# Patient Record
Sex: Male | Born: 1965
Health system: Southern US, Community
[De-identification: ages and names within clinical notes are randomized; demographics above are authoritative.]

## PROBLEM LIST (undated history)

## (undated) DIAGNOSIS — Z8619 Personal history of other infectious and parasitic diseases: Secondary | ICD-10-CM

## (undated) DIAGNOSIS — Z9884 Bariatric surgery status: Secondary | ICD-10-CM

## (undated) DIAGNOSIS — K760 Fatty (change of) liver, not elsewhere classified: Secondary | ICD-10-CM

## (undated) DIAGNOSIS — F419 Anxiety disorder, unspecified: Secondary | ICD-10-CM

## (undated) DIAGNOSIS — R0600 Dyspnea, unspecified: Secondary | ICD-10-CM

## (undated) DIAGNOSIS — D649 Anemia, unspecified: Secondary | ICD-10-CM

## (undated) DIAGNOSIS — M109 Gout, unspecified: Secondary | ICD-10-CM

## (undated) HISTORY — DX: Personal history of other infectious and parasitic diseases: Z86.19

## (undated) HISTORY — PX: INGUINAL HERNIA REPAIR: SUR1180

## (undated) HISTORY — DX: Anxiety disorder, unspecified: F41.9

## (undated) HISTORY — DX: Bariatric surgery status: Z98.84

## (undated) HISTORY — PX: VENTRAL HERNIA REPAIR: SHX424

## (undated) HISTORY — PX: JOINT REPLACEMENT: SHX530

---

## 1998-01-09 DIAGNOSIS — Z9884 Bariatric surgery status: Secondary | ICD-10-CM

## 1998-01-09 HISTORY — DX: Bariatric surgery status: Z98.84

## 1998-01-09 HISTORY — PX: ROUX-EN-Y PROCEDURE: SUR1287

## 2003-01-10 DIAGNOSIS — T8859XA Other complications of anesthesia, initial encounter: Secondary | ICD-10-CM

## 2003-01-10 HISTORY — DX: Other complications of anesthesia, initial encounter: T88.59XA

## 2004-12-30 ENCOUNTER — Encounter: Admission: RE | Admit: 2004-12-30 | Discharge: 2004-12-30 | Payer: Self-pay | Admitting: Surgery

## 2006-05-16 ENCOUNTER — Encounter: Admission: RE | Admit: 2006-05-16 | Discharge: 2006-05-16 | Payer: Self-pay | Admitting: Surgery

## 2006-07-30 ENCOUNTER — Ambulatory Visit (HOSPITAL_COMMUNITY): Admission: RE | Admit: 2006-07-30 | Discharge: 2006-07-30 | Payer: Self-pay | Admitting: Surgery

## 2007-11-11 ENCOUNTER — Ambulatory Visit (HOSPITAL_COMMUNITY): Admission: RE | Admit: 2007-11-11 | Discharge: 2007-11-12 | Payer: Self-pay | Admitting: Surgery

## 2008-03-26 IMAGING — RF DG UGI W/ KUB
15 series · 15 of 15 positions shown · non-contrast
Comparison: none

CLINICAL DATA: Early satiety.  Evaluate pouch after gastric bypass surgery previously.
 KUB, UPPER GI:
 KUB:  Preliminary film of the abdomen shows a nonspecific bowel gas pattern.  Surgical sutures are noted within the medial left upper quadrant.  
 Upper GI:  A single contrast upper GI was performed.  The swallowing mechanism appears normal.  No hiatal hernia is seen. The gastric pouch fills and there is no delay in passage of barium into the small bowel in this patient who has a small gastric pouch and gastrojejunostomy.  No ulceration is seen.  There is moderate gastroesophageal reflux noted.  The proximal small bowel that is visualized appears normal.

[Series 1: run · 1 of 1 slices shown (1 of 14)]
[im 1/1]
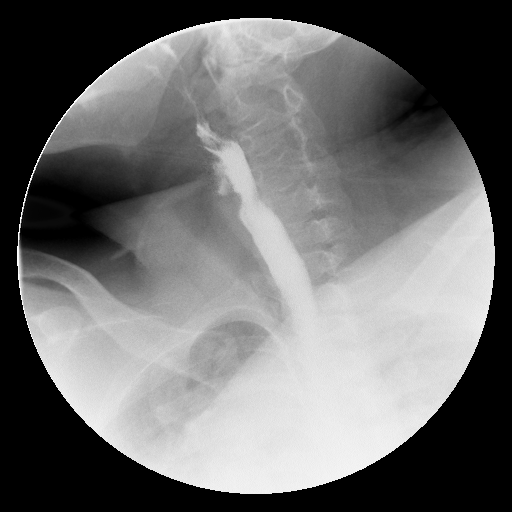

[Series 2: run · 1 of 1 slices shown (2 of 14)]
[im 1/1]
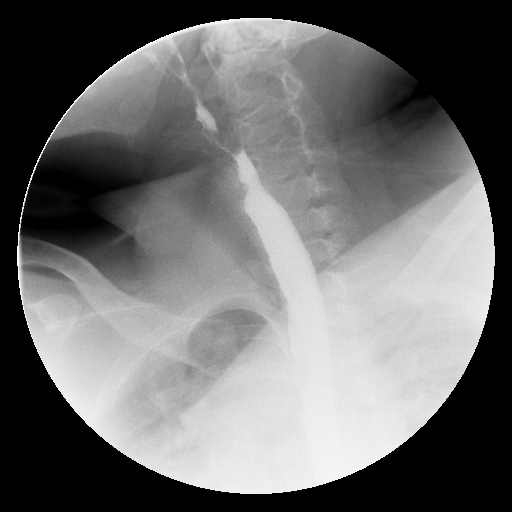

[Series 3: run · 1 of 1 slices shown (3 of 14)]
[im 1/1]
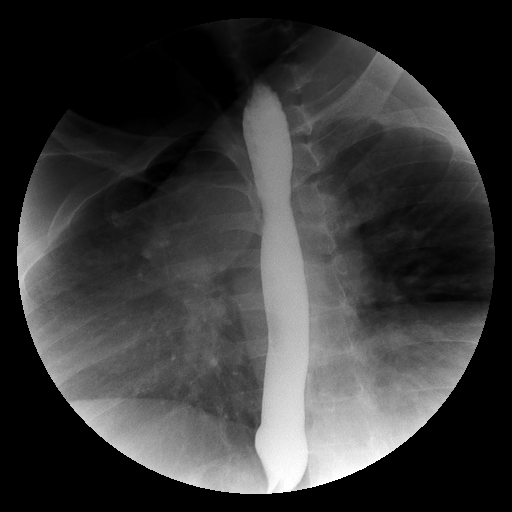

[Series 4: run · 1 of 1 slices shown (4 of 14)]
[im 1/1]
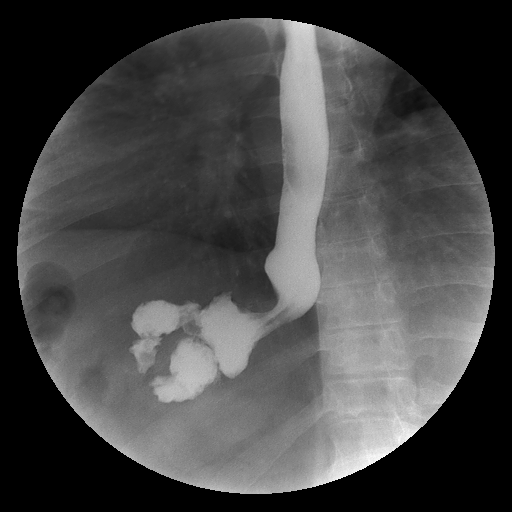

[Series 5: run · 1 of 1 slices shown (5 of 14)]
[im 1/1]
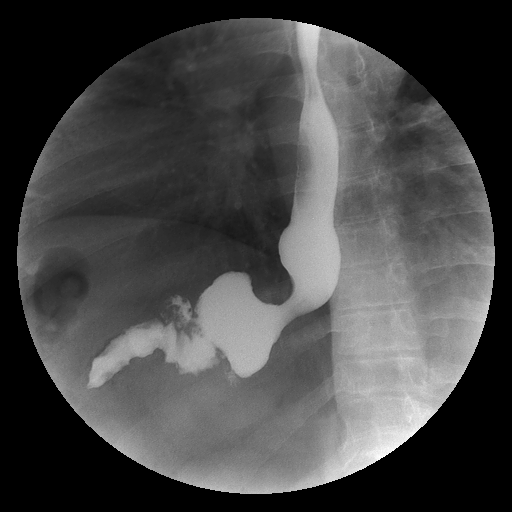

[Series 6: run · 1 of 1 slices shown (6 of 14)]
[im 1/1]
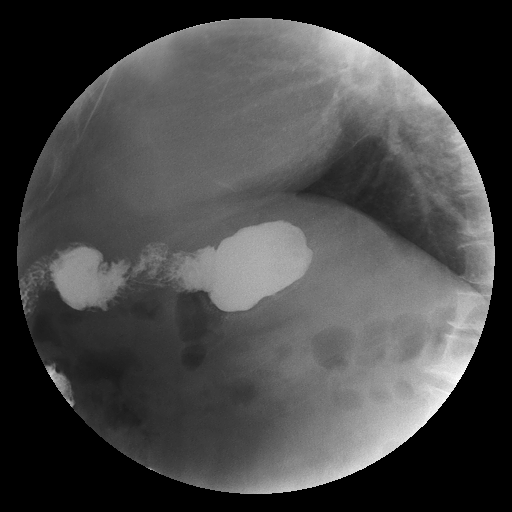

[Series 7: run · 1 of 1 slices shown (7 of 14)]
[im 1/1]
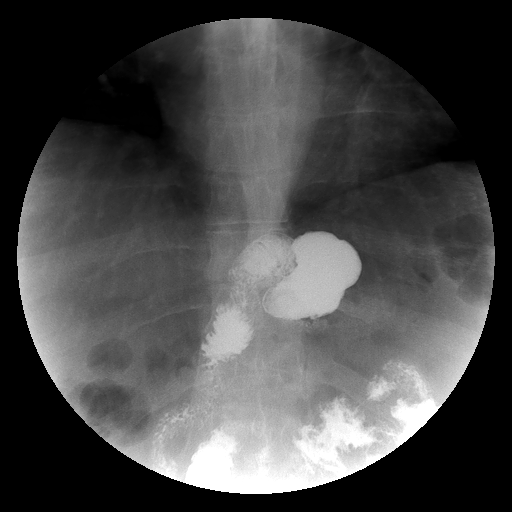

[Series 8: run · 1 of 1 slices shown (8 of 14)]
[im 1/1]
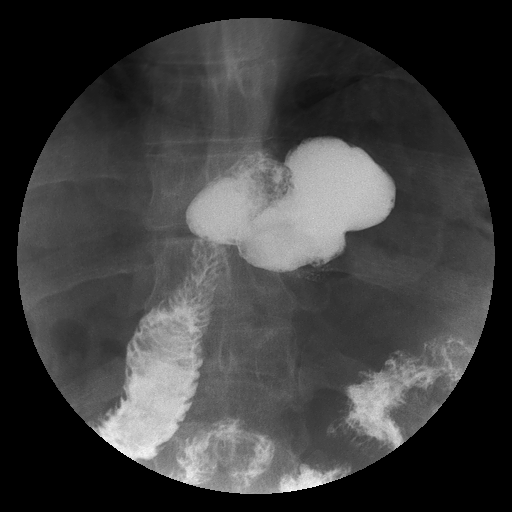

[Series 9: run · 1 of 1 slices shown (9 of 14)]
[im 1/1]
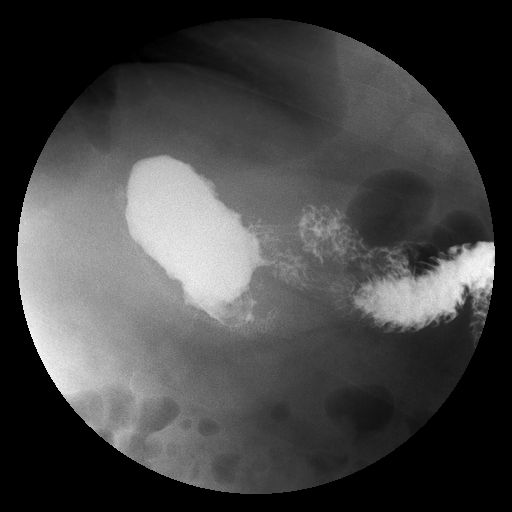

[Series 10: run · 1 of 1 slices shown (10 of 14)]
[im 1/1]
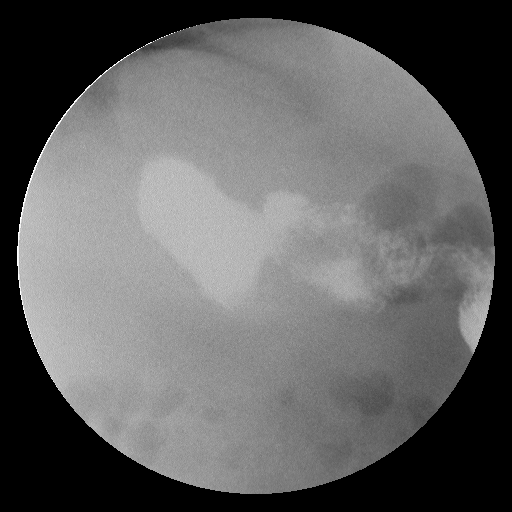

[Series 11: run · 1 of 1 slices shown (11 of 14)]
[im 1/1]
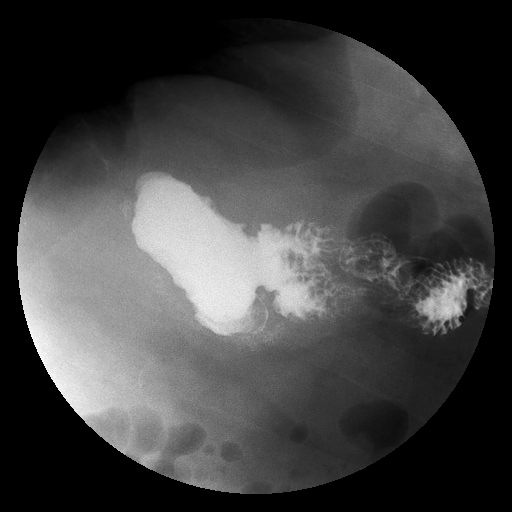

[Series 12: run · 1 of 1 slices shown (12 of 14)]
[im 1/1]
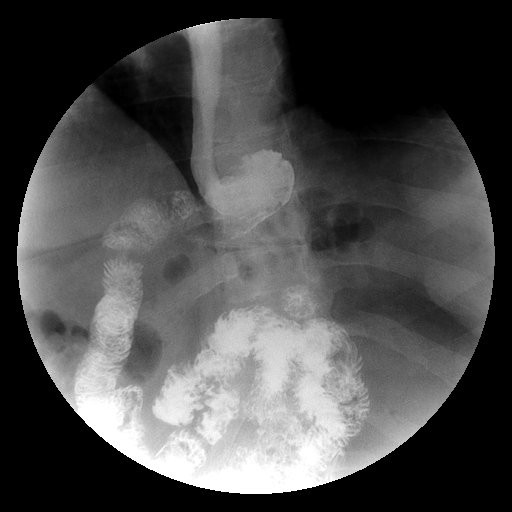

[Series 13: run · 1 of 1 slices shown (13 of 14)]
[im 1/1]
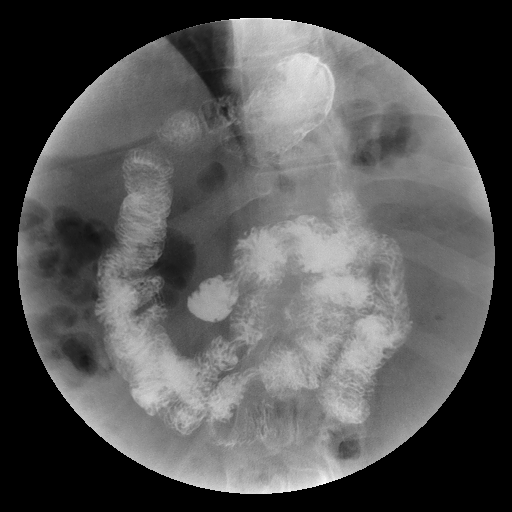

[Series 14: run · 1 of 1 slices shown (14 of 14)]
[im 1/1]
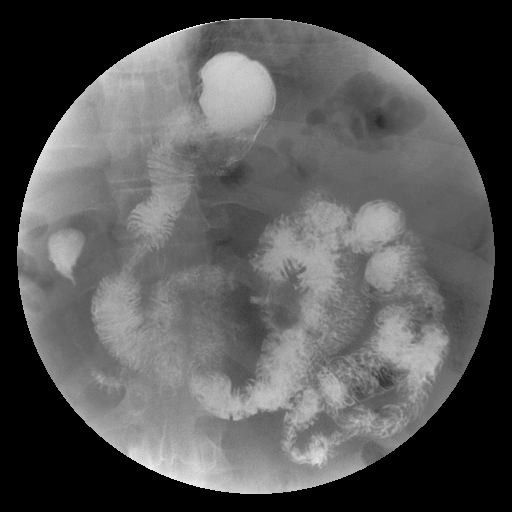

[Series 1001: view not recorded · 0.20mm/px · 1 of 1 slices shown]
[im 1/1]
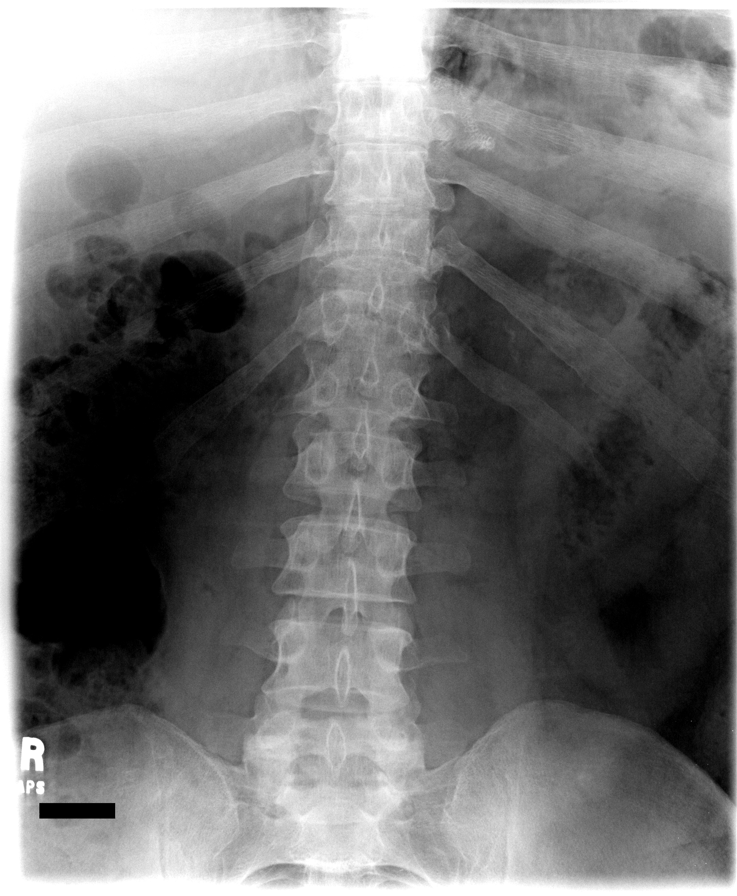

[15 of 15 positions shown; findings below may reference images not displayed]

IMPRESSION: Small gastric pouch with no delay in passage of barium into the small bowel.  Moderate reflux.

## 2010-05-24 NOTE — Op Note (Signed)
[name], [name]                  ACCOUNT NO.:  [id number]   MEDICAL RECORD NO.:  [id number]          PATIENT TYPE:  AMB   LOCATION:  ENDO                         FACILITY:  [facility]   PHYSICIAN:  Sandria Bales. Ezzard Standing, M.D.  DATE OF BIRTH:  10-08-1965   DATE OF PROCEDURE:  DATE OF DISCHARGE:                               OPERATIVE REPORT   PREOPERATIVE DIAGNOSES:  Weight gain post gastric bypass.   POSTOPERATIVE DIAGNOSES:  Slightly enlarged pouch with normal  anastomosis, status post gastrojejunostomy.   PROCEDURE:  Esophagogastrojejunoscopy.   SURGEON:  Sandria Bales. Ezzard Standing, M.D.   FIRST ASSISTANT:  None.   ANESTHESIA:  50 mcg of fentanyl, 5 mg of Versed.   COMPLICATIONS:  None.   INDICATIONS FOR PROCEDURE:  Mr. Bruce Wang is a 45 year old, white male who  had a gastric bypass in [facility] in May of 2000. He initial weight  was approximately 397, he says his bottom weight was 240 pounds, he is  now back up to about 295 pounds and feels like he has no restriction  from his bypass.   He now comes for endoscopy to better define his anatomy. He had an upper  GI which actually showed a small pouch with a prompt emptying of his  gastric contents into his jejunum.   DESCRIPTION OF PROCEDURE:  The patient was placed in the left lateral  decubitus position. He was initially anesthetized the back of his throat  with Cetacaine. He had an IV in place, he had an EKG, pulse oximetry,  nasal O2 and EKG to monitor during his sedation   I gave him 50 mcg of fentanyl, 5 mg of Versed, passed the Pentax upper  endoscope without difficulty into his gastric pouch. I visualized his EG  junction at 40 cm, I visualized his gastrojejunal anastomosis at 45 cm.  He had a healthy looking anastomosis with no evidence of ulcer though he  did have what appeared to be a silk suture immediately beside the  anastomosis. His stomach pouch is mildly enlarged and this was enough  that when I retroflexed the scope I could  retroflex it within the  stomach pouch. I saw no ulcers, no erosions, no evidence of fistula.   IMPRESSION:  He has a mildly enlarged stomach. I wonder whether it is  worth surgically to try to revise the stomach as in paring its size  down. I think no matter what happens Mr. Cozort will have to control his  eating from a diet standpoint and increase exercise. I gave him copies  of the pictures and discussed this with him and his wife.      Sandria Bales. Ezzard Standing, M.D.  Electronically Signed     DHN/MEDQ  D:  07/30/2006  T:  07/30/2006  Job:  [id number]   cc:   [place] Daphine Deutscher, MD  [address], [address]  [place]  [place] [address]

## 2010-05-24 NOTE — Op Note (Signed)
Wang, Bruce                  ACCOUNT NO.:  [id number]   MEDICAL RECORD NO.:  [id number]          PATIENT TYPE:  OIB   LOCATION:  0098                         FACILITY:  [facility]   PHYSICIAN:  Ardeth Sportsman, MD     DATE OF BIRTH:  June 30, 1965   DATE OF PROCEDURE:  DATE OF DISCHARGE:                               OPERATIVE REPORT   PRIMARY CARE PHYSICIAN:  [facility] [place], [place].   SURGEON:  Ardeth Sportsman, M.D.   ASSISTANT:  None.   PREOPERATIVE DIAGNOSIS:  Ventral hernia, status post open gastric  bypass.   POSTOPERATIVE DIAGNOSIS:  Incarcerated open ventral hernia, status post  open gastric bypass graft.   PROCEDURE PERFORMED:  1. Laparoscopic lysis of adhesions x60 minutes (equals half the case).  2. Laparoscopic ventral hernia repair with 20 x 30 cm dual-sided mesh      (Parietex/Seprafilm).   ANESTHESIA:  1. General anesthesia.  2. Local anesthetic in a field block around all port sites.   SPECIMENS:  None.   DRAINS:  None.   ESTIMATED BLOOD LOSS:  15 ml.   COMPLICATIONS:  None apparent.   INDICATIONS:  Bruce Wang is a pleasant 45 year old male who had an open  gastric bypass performed back in March, 2000 through an open incision in  [place], [place].  He has struggled with pain at his incision for some  time now.  It is a little bit of an inability to work in some ways.  He  has been working to try and lose weight after his gastric bypass graft.  He has been evaluated by our bariatric team.   The anatomy and physiology of hernia was explained.  The pathophysiology  of incarceration and possible strangulation and debilitating pain was  discussed.  Options discussed.  Recommendations made for diagnostic  laparoscopy with lysis of adhesions and underlay repair with mesh.  Risks, benefits and alternatives were discussed.  Questions answered.  He agreed to proceed.   OPERATIVE FINDINGS:  He had a large Swiss cheese effect of most of his  supraumbilical incision, going about 7 x 14 cm in size with 4 moderate-  sized defects over this region.  It was incarcerated with omentum and  transverse colon.   DESCRIPTION OF PROCEDURE:  Informed consent was confirmed.  The patient  underwent general anesthesia without any difficulty.  He voided just  prior to going to the operating room.  He received IV antibiotics just  prior to surgery.  Sequential compression devices were active during the  entire case.  He was positioned supine with both arms tucked.  His  abdomen was clipped, prepped and draped in a sterile fashion.   A 5.0 port was placed in the left upper quadrant using optimal entry  technique with the patient in steep reverse Trendelenburg and left side  up.  A camera inspection revealed no intra-abdominal injury.  Capnoperitoneum to 15 mmHg provided good anterior abdominal  insufflation.  Under direct visualization, 5-mm ports were placed in the  left flank, left lower quadrant, and right flank.  The  left flank  incision was upgraded to a 10-mm port site.   Camera inspection revealed the large wad of incarcerated tissues at the  upper midline incision.  Careful sharp dissection was done to get around  the edges to help free it off and to gradually reduce, using hand  pressure on the abdomen as well as internal pressure using atraumatic  graspers to gradually reduce the contents back in.  This took some time.  I ended up freeing the stomach and liver, falciform ligament and  turbulent hepatic ligament and its detachments off the anterior  abdominal wall.  There, the defect was measured as noted.  I decided to  use 20 x 30 cm of mesh, as there was no 20 x 25 available.  I was  worried about contracture with a smaller piece of mesh, given his large  body habitus and young age.  I ended up placing 12 alternating stitches  of #1 Novofil and Ethibond stitches around the rim of the mesh about  every 5 cm.  The tails were tied.   The mesh was rolled in rough-side-in,  placed into the abdomen through the left flank 10 ml fascial defect on a  roll and then tacked to the anterior abdominal wall using a suture  passer to good result.  The mesh laid well.  It was snug but not overly  tight and had good excellent overlap.  Fascial stitches are tied down.  Cork screw tacks were used to help keep the edge of the mesh up against  the anterior abdominal wall circumferentially.  There is excellent  cover.   Camera inspected revealed excellent hemostasis on the greater omentum  and no evidence of any colon or any other bowel injury.  Capnoperitoneum  was evacuated.  Ports were removed.  The 10-mm fascial defect was  actually tunneled at an oblique angle, and it was actually covered by  the mesh itself, so I did not do more aggressive closure.  The skin was  closed using a 4-0 Monocryl stitch and Steri-Strips and small puncture  bites.  Patient was extubated and sent to the recovery room in stable  condition.   I discussed the postoperative care with the patient just prior to  surgery, and I will discuss it with family as well.      Ardeth Sportsman, MD  Electronically Signed     SCG/MEDQ  D:  11/11/2007  T:  11/11/2007  Job:  [id number]   cc:   [facility]

## 2011-02-03 LAB — LIPID PANEL: Direct LDL: 69

## 2011-03-09 ENCOUNTER — Ambulatory Visit: Payer: Self-pay | Admitting: Family Medicine

## 2011-03-16 ENCOUNTER — Encounter: Payer: Self-pay | Admitting: Family Medicine

## 2011-03-16 ENCOUNTER — Ambulatory Visit (INDEPENDENT_AMBULATORY_CARE_PROVIDER_SITE_OTHER): Payer: Managed Care, Other (non HMO) | Admitting: Family Medicine

## 2011-03-16 VITALS — BP 124/76 | HR 68 | Temp 98.6°F | Ht 72.0 in | Wt 270.5 lb

## 2011-03-16 DIAGNOSIS — G4700 Insomnia, unspecified: Secondary | ICD-10-CM

## 2011-03-16 DIAGNOSIS — R5381 Other malaise: Secondary | ICD-10-CM

## 2011-03-16 DIAGNOSIS — R5383 Other fatigue: Secondary | ICD-10-CM

## 2011-03-16 DIAGNOSIS — Z6841 Body Mass Index (BMI) 40.0 and over, adult: Secondary | ICD-10-CM | POA: Insufficient documentation

## 2011-03-16 DIAGNOSIS — E669 Obesity, unspecified: Secondary | ICD-10-CM

## 2011-03-16 DIAGNOSIS — R531 Weakness: Secondary | ICD-10-CM | POA: Insufficient documentation

## 2011-03-16 DIAGNOSIS — Z9884 Bariatric surgery status: Secondary | ICD-10-CM | POA: Insufficient documentation

## 2011-03-16 DIAGNOSIS — Z23 Encounter for immunization: Secondary | ICD-10-CM

## 2011-03-16 DIAGNOSIS — Z6837 Body mass index (BMI) 37.0-37.9, adult: Secondary | ICD-10-CM | POA: Insufficient documentation

## 2011-03-16 LAB — COMPREHENSIVE METABOLIC PANEL
ALT: 26 U/L (ref 0–53)
AST: 25 U/L (ref 0–37)
Albumin: 4.4 g/dL (ref 3.5–5.2)
Alkaline Phosphatase: 69 U/L (ref 39–117)
Chloride: 103 mEq/L (ref 96–112)
Potassium: 4.7 mEq/L (ref 3.5–5.1)
Sodium: 140 mEq/L (ref 135–145)
Total Protein: 7 g/dL (ref 6.0–8.3)

## 2011-03-16 LAB — CBC WITH DIFFERENTIAL/PLATELET
Eosinophils Absolute: 0.1 10*3/uL (ref 0.0–0.7)
Eosinophils Relative: 1.7 % (ref 0.0–5.0)
HCT: 43.1 % (ref 39.0–52.0)
Lymphs Abs: 1.6 10*3/uL (ref 0.7–4.0)
MCHC: 33.3 g/dL (ref 30.0–36.0)
MCV: 86.5 fl (ref 78.0–100.0)
Monocytes Absolute: 0.3 10*3/uL (ref 0.1–1.0)
Platelets: 212 10*3/uL (ref 150.0–400.0)
RDW: 14 % (ref 11.5–14.6)
WBC: 4.1 10*3/uL — ABNORMAL LOW (ref 4.5–10.5)

## 2011-03-16 LAB — IBC PANEL
Iron: 58 ug/dL (ref 42–165)
Saturation Ratios: 13.8 % — ABNORMAL LOW (ref 20.0–50.0)
Transferrin: 300.5 mg/dL (ref 212.0–360.0)

## 2011-03-16 NOTE — Assessment & Plan Note (Addendum)
Sleep maintenance insomnia, seems very situational. Discussed sleep hygiene and provided with some recommendations - ie tv out of room, bed only for sleep/sex, protected time for jotting down things done that day or things that need to be done next day so he doesn't wake up thinking about this. No EtOH during weekdays.  Will need to verify no caffeine towards end of day. No sxs OSA.

## 2011-03-16 NOTE — Assessment & Plan Note (Signed)
Check vitamin levels , rec start vit D and B12 supplements. Provided with vit supplementation after roux en y handout

## 2011-03-16 NOTE — Assessment & Plan Note (Signed)
Encouraged continued activity/exercise. Body mass index is 36.69 kg/(m^2).

## 2011-03-16 NOTE — Progress Notes (Signed)
Subjective:    Patient ID: Bruce Wang, male    DOB: [birth year], 46 y.o.   MRN: [id number]  HPI CC: new pt, establish  No PCP in 8 years.  Has seen [facility] for issues.  Sleep issues - New job 2 mo ago.  Having trouble shutting off mind at night, getting a good night's rest.  Falls asleep easily, doesn't stay asleep.  Sleep maintenance issues.  Tried melatonin, didn't help.  Does snore, endorses non restorative sleep.  No apneic episodes.  No daytime somnolence at all.  S/p gastric bypass roux en y 2000 - prior weighed 400 lbs.  Lowest weight 240 lbs.  Over last 3 yrs weight fluctuates 240-280lbs.  Feeling tired during day.  Wonders about low testosterone.  Sometimes feels down but not overwhelmed.  Somewhat decreased libido. Body mass index is 36.69 kg/(m^2).  Wants to get healthier.  Dieting, walks 61mi/day.  Tried running but caused knee pain.  Doesn't like elliptical.  May consider biking.  Preventative: No CPE in 8 yrs.   Tetanus - unsure.  Would like today. Flu shot - did not receive.  Caffeine: 1 pot coffee/day Lives with wife, 2 children, 2 dogs Occupation: Regulatory affairs officer for [employer] Edu: 2 yrs college Activity: walking 2 mi/day Diet: good water, fruits/vegetables daily, red meat 2x/wk, never fish.   alternates between low carb and eating healthy.    Review of Systems  Constitutional: Negative for fever, chills, activity change, appetite change, fatigue and unexpected weight change.  HENT: Negative for hearing loss and neck pain.   Eyes: Negative for visual disturbance.  Respiratory: Negative for cough, chest tightness, shortness of breath and wheezing.   Cardiovascular: Negative for chest pain, palpitations and leg swelling.  Gastrointestinal: Negative for nausea, vomiting, abdominal pain, diarrhea, constipation, blood in stool and abdominal distention.  Genitourinary: Negative for hematuria and difficulty urinating.  Musculoskeletal: Negative for myalgias and arthralgias.    Skin: Negative for rash.  Neurological: Negative for dizziness, seizures, syncope and headaches.  Hematological: Does not bruise/bleed easily.  Psychiatric/Behavioral: Negative for dysphoric mood. The patient is not nervous/anxious.        Objective:   Physical Exam  Nursing note and vitals reviewed. Constitutional: He is oriented to person, place, and time. He appears well-developed and well-nourished. No distress.       obese  HENT:  Head: Normocephalic and atraumatic.  Right Ear: External ear normal.  Left Ear: External ear normal.  Nose: Nose normal.  Mouth/Throat: Oropharynx is clear and moist. No oropharyngeal exudate.  Eyes: Conjunctivae and EOM are normal. Pupils are equal, round, and reactive to light. No scleral icterus.  Neck: Normal range of motion. Neck supple. No thyromegaly present.  Cardiovascular: Normal rate, regular rhythm, normal heart sounds and intact distal pulses.   No murmur heard. Pulses:      Radial pulses are 2+ on the right side, and 2+ on the left side.  Pulmonary/Chest: Effort normal and breath sounds normal. No respiratory distress. He has no wheezes. He has no rales.  Abdominal: Soft. Bowel sounds are normal. He exhibits no distension and no mass. There is no tenderness. There is no rebound and no guarding.  Musculoskeletal: Normal range of motion. He exhibits no edema.  Lymphadenopathy:    He has no cervical adenopathy.  Neurological: He is alert and oriented to person, place, and time.       CN grossly intact, station and gait intact  Skin: Skin is warm and dry. No rash  noted.  Psychiatric: He has a normal mood and affect. His behavior is normal. Judgment and thought content normal.      Assessment & Plan:

## 2011-03-16 NOTE — Patient Instructions (Signed)
Xray today to check on reversible causes of fatigue and to check vitamin levels after bypass. I recommend starting vitamin D as well as vitamin B12 supplement. We will update you if other supplements needed. Try strategies discussed for sleep.   If not improving, let me know. Otherwise return in 1 year or as needed. Good to meet you today, call us with questions.

## 2011-03-16 NOTE — Assessment & Plan Note (Signed)
Mild fatigue.  Check blood work for reversible causes of fatigue. Reasonable to check testosterone.  Discussed if abnormal, will need to return for further labs.

## 2011-03-17 LAB — TESTOSTERONE: Testosterone: 419.51 ng/dL (ref 350.00–890.00)

## 2011-03-22 ENCOUNTER — Encounter: Payer: Self-pay | Admitting: Family Medicine

## 2012-03-14 ENCOUNTER — Encounter: Payer: Self-pay | Admitting: Orthopedic Surgery

## 2012-04-09 ENCOUNTER — Encounter: Payer: Self-pay | Admitting: Orthopedic Surgery

## 2013-10-24 ENCOUNTER — Other Ambulatory Visit: Payer: Self-pay

## 2013-12-15 ENCOUNTER — Ambulatory Visit: Payer: Self-pay | Admitting: Nurse Practitioner

## 2013-12-30 ENCOUNTER — Ambulatory Visit: Payer: Self-pay | Admitting: Nurse Practitioner

## 2014-01-23 DIAGNOSIS — M5126 Other intervertebral disc displacement, lumbar region: Secondary | ICD-10-CM | POA: Insufficient documentation

## 2015-04-19 DIAGNOSIS — M109 Gout, unspecified: Secondary | ICD-10-CM | POA: Diagnosis not present

## 2015-06-01 ENCOUNTER — Encounter: Payer: Self-pay | Admitting: Primary Care

## 2015-06-01 ENCOUNTER — Ambulatory Visit (INDEPENDENT_AMBULATORY_CARE_PROVIDER_SITE_OTHER): Payer: BLUE CROSS/BLUE SHIELD | Admitting: Primary Care

## 2015-06-01 VITALS — BP 122/84 | HR 110 | Temp 98.5°F | Ht 72.0 in | Wt 281.0 lb

## 2015-06-01 DIAGNOSIS — M10072 Idiopathic gout, left ankle and foot: Secondary | ICD-10-CM

## 2015-06-01 DIAGNOSIS — J209 Acute bronchitis, unspecified: Secondary | ICD-10-CM

## 2015-06-01 DIAGNOSIS — Z8639 Personal history of other endocrine, nutritional and metabolic disease: Secondary | ICD-10-CM

## 2015-06-01 DIAGNOSIS — M109 Gout, unspecified: Secondary | ICD-10-CM

## 2015-06-01 DIAGNOSIS — Z8739 Personal history of other diseases of the musculoskeletal system and connective tissue: Secondary | ICD-10-CM

## 2015-06-01 MED ORDER — ALLOPURINOL 100 MG PO TABS: 200.0000 mg | ORAL_TABLET | Freq: Every day | ORAL | Status: DC

## 2015-06-01 MED ORDER — PREDNISONE 10 MG PO TABS: ORAL_TABLET | ORAL | Status: DC

## 2015-06-01 MED ORDER — AZITHROMYCIN 250 MG PO TABS: ORAL_TABLET | ORAL | Status: DC

## 2015-06-01 NOTE — Assessment & Plan Note (Signed)
Currently managed on Allopurinol 100 mg for gout. 3 gout attacks in 2 months. Gout attack present today. Will treat with prednisone taper and increase Allopurinol to 200 mg once daily. Also return in 2 weeks to check uric acid levels once acute attack has resolved. Discussed trigger foods and to avoid. He will update myself or PCP if no improvement.

## 2015-06-01 NOTE — Progress Notes (Signed)
Pre visit review using our clinic review tool, if applicable. No additional management support is needed unless otherwise documented below in the visit note. 

## 2015-06-01 NOTE — Progress Notes (Signed)
Subjective:    Patient ID: Bruce Wang, male    DOB: 1965/05/27, 50 y.o.   MRN: [id number]  HPI  Bruce Wang is a 50 year old male who presents today with multiple complaints.  1) Cough: Present since last weekend. He's felt feverish with wheezing and cough for the past several days. His cough is productive with brownish sputum. He's taken Robitussin at bedtime with temporary improvement. His girlfriend's daughter was treated for strep throat last week. Overall he's feeling horrible and much worse than he did last weekend.  2) Gout Attack: Currently managed on Allopurinol 100 mg daily for gout. He's experienced a gout attack three times in the past 2 months, all located to the left great toe. His most recent attack occurred several days ago. He's been evaluated at Urgent Care each time for his attacks and treated with prednisone. He will experience improvement with prednisone, but his gout will return several days later.  Review of Systems  Constitutional: Positive for chills.  HENT: Positive for congestion, sinus pressure and sore throat. Negative for sneezing.   Respiratory: Positive for cough and wheezing.   Musculoskeletal:       Left great toe pain and inflammation.       Past Medical History  Diagnosis Date  . History of chicken pox   . Gastric bypass status for obesity 2000    roux en y     Social History   Social History  . Marital Status: Married    Spouse Name: N/A  . Number of Children: N/A  . Years of Education: N/A   Occupational History  . Not on file.   Social History Main Topics  . Smoking status: Former Smoker    Quit date: 01/09/2010  . Smokeless tobacco: Never Used  . Alcohol Use: Yes     Comment: Regular on weekends  . Drug Use: No  . Sexual Activity: Yes     Comment: wife   Other Topics Concern  . Not on file   Social History Narrative   Caffeine: 1 pot coffee/day   Lives with wife, 2 children, 2 dogs   Occupation: Editor, commissioning for  [employer]   Edu: 2 yrs college   Activity: walking 2 mi/day   Diet: good water, fruits/vegetables daily, red meat 2x/wk, never fish.     alternates between low carb and eating healthy.      Past Surgical History  Procedure Laterality Date  . Roux-en-y procedure  2000  . Inguinal hernia repair  1990s    Family History  Problem Relation Age of Onset  . Hypertension Mother   . Hypertension Sister   . Cancer Paternal Uncle     colon cancer, prostate cancer  . Dementia Maternal Grandmother   . Parkinsonism Maternal Grandfather   . Coronary artery disease Neg Hx   . Stroke Neg Hx   . Diabetes Neg Hx     No Known Allergies  Current Outpatient Prescriptions on File Prior to Visit  Medication Sig Dispense Refill  . Multiple Vitamins-Minerals (MULTIVITAMIN PO) Take 1 tablet by mouth daily.     No current facility-administered medications on file prior to visit.    BP 122/84 mmHg  Pulse 110  Temp(Src) 98.5 F (36.9 C) (Oral)  Ht 6' (1.829 m)  Wt 281 lb (127.461 kg)  BMI 38.10 kg/m2  SpO2 95%    Objective:   Physical Exam  Constitutional: He appears well-nourished. He appears ill.  HENT:  Right  Ear: Tympanic membrane and ear canal normal.  Left Ear: Tympanic membrane and ear canal normal.  Nose: Mucosal edema present. Right sinus exhibits no maxillary sinus tenderness and no frontal sinus tenderness. Left sinus exhibits no maxillary sinus tenderness and no frontal sinus tenderness.  Mouth/Throat: Posterior oropharyngeal erythema present. No oropharyngeal exudate or posterior oropharyngeal edema.  Pulmonary/Chest: He has no decreased breath sounds. He has wheezes in the right upper field and the left upper field. He has rhonchi in the right upper field, the right lower field, the left upper field and the left lower field.          Assessment & Plan:  Cough:  Present for 5-6 days, worse now. Also with chills, fatigue. Feeling worse.  Exam with moderate rhonchi  throughout all fields, mild wheezing to upper fields. Appears ill and is tachycardic in office today which could also be due to acute gout attack. Given presentation, exam, and tachycardia, will treat. Rx for Zpak sent to pharmacy. Also treat with prendisone taper for wheezing and acute gout attack. Encouraged fluids, rest. Return precautions provided.

## 2015-06-01 NOTE — Patient Instructions (Signed)
Start Azithromycin antibiotics. Take 2 tablets by mouth today, then 1 tablet daily for 4 additional days.  Start Prednisone taper for your gout. Take 3 tablets for 3 days, then 2 tablets for 3 days, then 1 tablet for 3 days.  We've increased your Allopurinol from 100 mg to 200 mg. Take 2 tablets by mouth every day.   Please schedule a lab only appointment in 2 weeks to recheck your gout levels.  It was a pleasure meeting you!  Gout Gout is an inflammatory arthritis caused by a buildup of uric acid crystals in the joints. Uric acid is a chemical that is normally present in the blood. When the level of uric acid in the blood is too high it can form crystals that deposit in your joints and tissues. This causes joint redness, soreness, and swelling (inflammation). Repeat attacks are common. Over time, uric acid crystals can form into masses (tophi) near a joint, destroying bone and causing disfigurement. Gout is treatable and often preventable. CAUSES  The disease begins with elevated levels of uric acid in the blood. Uric acid is produced by your body when it breaks down a naturally found substance called purines. Certain foods you eat, such as meats and fish, contain high amounts of purines. Causes of an elevated uric acid level include:  Being passed down from parent to child (heredity).  Diseases that cause increased uric acid production (such as obesity, psoriasis, and certain cancers).  Excessive alcohol use.  Diet, especially diets rich in meat and seafood.  Medicines, including certain cancer-fighting medicines (chemotherapy), water pills (diuretics), and aspirin.  Chronic kidney disease. The kidneys are no longer able to remove uric acid well.  Problems with metabolism. Conditions strongly associated with gout include:  Obesity.  High blood pressure.  High cholesterol.  Diabetes. Not everyone with elevated uric acid levels gets gout. It is not understood why some people get  gout and others do not. Surgery, joint injury, and eating too much of certain foods are some of the factors that can lead to gout attacks. SYMPTOMS   An attack of gout comes on quickly. It causes intense pain with redness, swelling, and warmth in a joint.  Fever can occur.  Often, only one joint is involved. Certain joints are more commonly involved:  Base of the big toe.  Knee.  Ankle.  Wrist.  Finger. Without treatment, an attack usually goes away in a few days to weeks. Between attacks, you usually will not have symptoms, which is different from many other forms of arthritis. DIAGNOSIS  Your caregiver will suspect gout based on your symptoms and exam. In some cases, tests may be recommended. The tests may include:  Blood tests.  Urine tests.  X-rays.  Joint fluid exam. This exam requires a needle to remove fluid from the joint (arthrocentesis). Using a microscope, gout is confirmed when uric acid crystals are seen in the joint fluid. TREATMENT  There are two phases to gout treatment: treating the sudden onset (acute) attack and preventing attacks (prophylaxis).  Treatment of an Acute Attack.  Medicines are used. These include anti-inflammatory medicines or steroid medicines.  An injection of steroid medicine into the affected joint is sometimes necessary.  The painful joint is rested. Movement can worsen the arthritis.  You may use warm or cold treatments on painful joints, depending which works best for you.  Treatment to Prevent Attacks.  If you suffer from frequent gout attacks, your caregiver may advise preventive medicine. These medicines are  started after the acute attack subsides. These medicines either help your kidneys eliminate uric acid from your body or decrease your uric acid production. You may need to stay on these medicines for a very long time.  The early phase of treatment with preventive medicine can be associated with an increase in acute gout  attacks. For this reason, during the first few months of treatment, your caregiver may also advise you to take medicines usually used for acute gout treatment. Be sure you understand your caregiver's directions. Your caregiver may make several adjustments to your medicine dose before these medicines are effective.  Discuss dietary treatment with your caregiver or dietitian. Alcohol and drinks high in sugar and fructose and foods such as meat, poultry, and seafood can increase uric acid levels. Your caregiver or dietitian can advise you on drinks and foods that should be limited. HOME CARE INSTRUCTIONS   Do not take aspirin to relieve pain. This raises uric acid levels.  Only take over-the-counter or prescription medicines for pain, discomfort, or fever as directed by your caregiver.  Rest the joint as much as possible. When in bed, keep sheets and blankets off painful areas.  Keep the affected joint raised (elevated).  Apply warm or cold treatments to painful joints. Use of warm or cold treatments depends on which works best for you.  Use crutches if the painful joint is in your leg.  Drink enough fluids to keep your urine clear or pale yellow. This helps your body get rid of uric acid. Limit alcohol, sugary drinks, and fructose drinks.  Follow your dietary instructions. Pay careful attention to the amount of protein you eat. Your daily diet should emphasize fruits, vegetables, whole grains, and fat-free or low-fat milk products. Discuss the use of coffee, vitamin C, and cherries with your caregiver or dietitian. These may be helpful in lowering uric acid levels.  Maintain a healthy body weight. SEEK MEDICAL CARE IF:   You develop diarrhea, vomiting, or any side effects from medicines.  You do not feel better in 24 hours, or you are getting worse. SEEK IMMEDIATE MEDICAL CARE IF:   Your joint becomes suddenly more tender, and you have chills or a fever. MAKE SURE YOU:   Understand  these instructions.  Will watch your condition.  Will get help right away if you are not doing well or get worse.   This information is not intended to replace advice given to you by your health care provider. Make sure you discuss any questions you have with your health care provider.   Document Released: 12/24/1999 Document Revised: 01/16/2014 Document Reviewed: 08/09/2011 Elsevier Interactive Patient Education [employer].

## 2015-06-08 ENCOUNTER — Ambulatory Visit (INDEPENDENT_AMBULATORY_CARE_PROVIDER_SITE_OTHER): Payer: BLUE CROSS/BLUE SHIELD | Admitting: Internal Medicine

## 2015-06-08 ENCOUNTER — Ambulatory Visit: Payer: BLUE CROSS/BLUE SHIELD | Admitting: Internal Medicine

## 2015-06-08 ENCOUNTER — Encounter: Payer: Self-pay | Admitting: Internal Medicine

## 2015-06-08 VITALS — BP 124/90 | HR 93 | Temp 98.7°F | Wt 277.5 lb

## 2015-06-08 DIAGNOSIS — R05 Cough: Secondary | ICD-10-CM | POA: Diagnosis not present

## 2015-06-08 DIAGNOSIS — R059 Cough, unspecified: Secondary | ICD-10-CM

## 2015-06-08 DIAGNOSIS — J209 Acute bronchitis, unspecified: Secondary | ICD-10-CM | POA: Diagnosis not present

## 2015-06-08 MED ORDER — ALBUTEROL SULFATE HFA 108 (90 BASE) MCG/ACT IN AERS: 2.0000 | INHALATION_SPRAY | Freq: Four times a day (QID) | RESPIRATORY_TRACT | Status: DC | PRN

## 2015-06-08 MED ORDER — HYDROCODONE-HOMATROPINE 5-1.5 MG/5ML PO SYRP: 5.0000 mL | ORAL_SOLUTION | Freq: Three times a day (TID) | ORAL | Status: DC | PRN

## 2015-06-08 NOTE — Patient Instructions (Signed)

## 2015-06-08 NOTE — Progress Notes (Signed)
Subjective:    Patient ID: Bruce Wang, male    DOB: 02-22-1965, 50 y.o.   MRN: [id number]  HPI  Pt presents to the clinic today with c/o cough and shortness of breath. This started. He was seen 5/23 for the same by Allie Bossier, NP- note reviewed. He was diagnosed with acute bronchitis, treated with Axithromycin and Prednisone. He has finished all the antibiotic and has 1 dose of Prednisone left. He feels like he is getting worse instead of better. The cough is non productive. He is SOB, mostly when laying down at night. He denies fever but has had chills. He has not taken anything OTC. He does not smoke and has no history of asthma. He has had sick contacts diagnosed with pneumonia.  Review of Systems      Past Medical History  Diagnosis Date  . History of chicken pox   . Gastric bypass status for obesity 2000    roux en y    Current Outpatient Prescriptions  Medication Sig Dispense Refill  . allopurinol (ZYLOPRIM) 100 MG tablet Take 2 tablets (200 mg total) by mouth daily. 60 tablet 6  . Multiple Vitamins-Minerals (MULTIVITAMIN PO) Take 1 tablet by mouth daily.     No current facility-administered medications for this visit.    No Known Allergies  Family History  Problem Relation Age of Onset  . Hypertension Mother   . Hypertension Sister   . Cancer Paternal Uncle     colon cancer, prostate cancer  . Dementia Maternal Grandmother   . Parkinsonism Maternal Grandfather   . Coronary artery disease Neg Hx   . Stroke Neg Hx   . Diabetes Neg Hx     Social History   Social History  . Marital Status: Married    Spouse Name: N/A  . Number of Children: N/A  . Years of Education: N/A   Occupational History  . Not on file.   Social History Main Topics  . Smoking status: Former Smoker    Quit date: 01/09/2010  . Smokeless tobacco: Never Used  . Alcohol Use: Yes     Comment: Regular on weekends  . Drug Use: No  . Sexual Activity: Yes     Comment: wife   Other  Topics Concern  . Not on file   Social History Narrative   Caffeine: 1 pot coffee/day   Lives with wife, 2 children, 2 dogs   Occupation: Editor, commissioning for [employer]   Edu: 2 yrs college   Activity: walking 2 mi/day   Diet: good water, fruits/vegetables daily, red meat 2x/wk, never fish.     alternates between low carb and eating healthy.       Constitutional: Denies fever, malaise, fatigue, headache or abrupt weight changes.  HEENT: Denies eye pain, eye redness, ear pain, ringing in the ears, wax buildup, runny nose, nasal congestion, bloody nose, or sore throat. Respiratory: Pt reports cough and shortness of breath. Denies difficulty breathing, or sputum production.   Cardiovascular: Denies chest pain, chest tightness, palpitations or swelling in the hands or feet.   No other specific complaints in a complete review of systems (except as listed in HPI above).  Objective:   Physical Exam  BP 124/90 mmHg  Pulse 93  Temp(Src) 98.7 F (37.1 C) (Oral)  Wt 277 lb 8 oz (125.873 kg)  SpO2 97% Wt Readings from Last 3 Encounters:  06/08/15 277 lb 8 oz (125.873 kg)  06/01/15 281 lb (127.461 kg)  03/16/11 270  lb 8 oz (122.698 kg)    General: Appears his stated age, obese in NAD. Skin: Warm, dry and intact. No rashes, lesions or ulcerations noted. HEENT: Head: normal shape and size, no sinus tenderness noted; Eyes: sclera white, no icterus, conjunctiva pink; Ears: Tm's gray and intact, normal light reflex; Throat/Mouth: Teeth present, mucosa pink and moist, no exudate, lesions or ulcerations noted.  Neck:  No adenopathy noted.  Cardiovascular: Normal rate and rhythm. S1,S2 noted.  No murmur, rubs or gallops noted.  Pulmonary/Chest: Normal effort and positive vesicular breath sounds. No respiratory distress. No wheezes, rales or ronchi noted.   BMET    Component Value Date/Time   NA 140 03/16/2011 1034   K 4.7 03/16/2011 1034   CL 103 03/16/2011 1034   CO2 28 03/16/2011 1034    GLUCOSE 97 03/16/2011 1034   BUN 16 03/16/2011 1034   CREATININE 1.0 03/16/2011 1034   CALCIUM 9.8 03/16/2011 1034    Lipid Panel     Component Value Date/Time   CHOL 143 02/03/2011   TRIG 67 02/03/2011   HDL 61 02/03/2011    CBC    Component Value Date/Time   WBC 4.1* 03/16/2011 1034   RBC 4.98 03/16/2011 1034   HGB 14.3 03/16/2011 1034   HCT 43.1 03/16/2011 1034   PLT 212.0 03/16/2011 1034   MCV 86.5 03/16/2011 1034   MCHC 33.3 03/16/2011 1034   RDW 14.0 03/16/2011 1034   LYMPHSABS 1.6 03/16/2011 1034   MONOABS 0.3 03/16/2011 1034   EOSABS 0.1 03/16/2011 1034   BASOSABS 0.0 03/16/2011 1034    Hgb A1C No results found for: HGBA1C       Assessment & Plan:   Post infectious cough secondary to acute bronchitis (resolved):  Exam benign Advised him to finish out his Prednisone eRx for Albuterol inhaler prn for shortness of breath RX for Hycodan for cough If persist or worsen, will get chest xray  RTC as needed

## 2015-06-16 ENCOUNTER — Other Ambulatory Visit (INDEPENDENT_AMBULATORY_CARE_PROVIDER_SITE_OTHER): Payer: BLUE CROSS/BLUE SHIELD

## 2015-06-16 DIAGNOSIS — Z8639 Personal history of other endocrine, nutritional and metabolic disease: Secondary | ICD-10-CM

## 2015-06-16 DIAGNOSIS — Z8739 Personal history of other diseases of the musculoskeletal system and connective tissue: Secondary | ICD-10-CM

## 2015-06-16 LAB — URIC ACID: Uric Acid, Serum: 6.4 mg/dL (ref 4.0–7.8)

## 2015-07-12 ENCOUNTER — Ambulatory Visit (INDEPENDENT_AMBULATORY_CARE_PROVIDER_SITE_OTHER): Payer: BLUE CROSS/BLUE SHIELD | Admitting: Family Medicine

## 2015-07-12 ENCOUNTER — Encounter: Payer: Self-pay | Admitting: Family Medicine

## 2015-07-12 VITALS — BP 112/86 | HR 88 | Temp 98.3°F | Wt 286.2 lb

## 2015-07-12 DIAGNOSIS — M1A0720 Idiopathic chronic gout, left ankle and foot, without tophus (tophi): Secondary | ICD-10-CM

## 2015-07-12 DIAGNOSIS — M1000 Idiopathic gout, unspecified site: Secondary | ICD-10-CM | POA: Diagnosis not present

## 2015-07-12 DIAGNOSIS — M109 Gout, unspecified: Secondary | ICD-10-CM | POA: Insufficient documentation

## 2015-07-12 MED ORDER — PREDNISONE 20 MG PO TABS: ORAL_TABLET | ORAL | Status: DC

## 2015-07-12 MED ORDER — VITAMIN C 500 MG PO TABS: 500.0000 mg | ORAL_TABLET | Freq: Every day | ORAL | Status: DC

## 2015-07-12 MED ORDER — ALLOPURINOL 300 MG PO TABS: 300.0000 mg | ORAL_TABLET | Freq: Every day | ORAL | Status: DC

## 2015-07-12 MED ORDER — COLCHICINE 0.6 MG PO TABS: 0.6000 mg | ORAL_TABLET | Freq: Every day | ORAL | Status: DC | PRN

## 2015-07-12 NOTE — Progress Notes (Signed)
   BP 112/86 mmHg  Pulse 88  Temp(Src) 98.3 F (36.8 C) (Oral)  Wt 286 lb 4 oz (129.842 kg)   CC: L great toe gout flare  Subjective:    Patient ID: Bruce Wang, male    DOB: Apr 06, 1965, 50 y.o.   MRN: [id number]  HPI: Bruce Wang is a 50 y.o. male presenting on 07/12/2015 for Gout   Last seen by me 03/2011, but has seen APPs at our office recently. Latest gout flare 05/2015 - treated by Anda Kraft with increasing allopurinol and prednisone.  On allopurinol 200mg  daily for prevention of gout.  Lab Results  Component Value Date   LABURIC 6.4 06/16/2015    Denies trauma or injuries to left foot.  Knows gout diet.   Multiple flares this year (5+).   Relevant past medical, surgical, family and social history reviewed and updated as indicated. Interim medical history since our last visit reviewed. Allergies and medications reviewed and updated. No current outpatient prescriptions on file prior to visit.   No current facility-administered medications on file prior to visit.    Review of Systems Per HPI unless specifically indicated in ROS section     Objective:    BP 112/86 mmHg  Pulse 88  Temp(Src) 98.3 F (36.8 C) (Oral)  Wt 286 lb 4 oz (129.842 kg)  Wt Readings from Last 3 Encounters:  07/12/15 286 lb 4 oz (129.842 kg)  06/08/15 277 lb 8 oz (125.873 kg)  06/01/15 281 lb (127.461 kg)   Body mass index is 38.81 kg/(m^2).  Physical Exam  Constitutional: He appears well-developed and well-nourished. No distress.  Musculoskeletal: He exhibits no edema.  L 1st MCPJ with erythema, edema, tenderness to palpation and with ROM at joint 2+ DP Sensation intact  Skin: Skin is warm and dry. No rash noted. There is erythema.  Psychiatric: He has a normal mood and affect.  Nursing note and vitals reviewed.  Results for orders placed or performed in visit on 06/16/15  Uric acid  Result Value Ref Range   Uric Acid, Serum 6.4 4.0 - 7.8 mg/dL      Assessment & Plan:   Problem  List Items Addressed This Visit    Gout    Discussed gout diet. rec start vit C 500mg  daily.  Increase allopurinol to 300mg  daily. Titrate until no further gout flares. Check urate next labwork.       Relevant Medications   allopurinol (ZYLOPRIM) 300 MG tablet   Podagra - Primary    Left acute podagra - treat with prednisone taper, colchicine.  After acute flare has resolved, increase allopurinol to 300mg  daily. Pt agrees with plan. RTC for CPE with fasting labs.       Relevant Medications   allopurinol (ZYLOPRIM) 300 MG tablet       Follow up plan: Return if symptoms worsen or fail to improve.  [name], MD

## 2015-07-12 NOTE — Progress Notes (Signed)
Pre visit review using our clinic review tool, if applicable. No additional management support is needed unless otherwise documented below in the visit note. 

## 2015-07-12 NOTE — Assessment & Plan Note (Addendum)
Discussed gout diet. rec start vit C 500mg  daily.  Increase allopurinol to 300mg  daily. Titrate until no further gout flares. Check urate next labwork.

## 2015-07-12 NOTE — Patient Instructions (Addendum)
Start vitamin C 500mg  daily. Treat current gout flare with prednisone taper and colchicine 1 tablet as needed for gout flare. Once gout flare has resolved, increase allopurinol to 300mg  daily (Rx printed today).  Update me with effect.   Gout Gout is an inflammatory arthritis caused by a buildup of uric acid crystals in the joints. Uric acid is a chemical that is normally present in the blood. When the level of uric acid in the blood is too high it can form crystals that deposit in your joints and tissues. This causes joint redness, soreness, and swelling (inflammation). Repeat attacks are common. Over time, uric acid crystals can form into masses (tophi) near a joint, destroying bone and causing disfigurement. Gout is treatable and often preventable. CAUSES  The disease begins with elevated levels of uric acid in the blood. Uric acid is produced by your body when it breaks down a naturally found substance called purines. Certain foods you eat, such as meats and fish, contain high amounts of purines. Causes of an elevated uric acid level include:  Being passed down from parent to child (heredity).  Diseases that cause increased uric acid production (such as obesity, psoriasis, and certain cancers).  Excessive alcohol use.  Diet, especially diets rich in meat and seafood.  Medicines, including certain cancer-fighting medicines (chemotherapy), water pills (diuretics), and aspirin.  Chronic kidney disease. The kidneys are no longer able to remove uric acid well.  Problems with metabolism. Conditions strongly associated with gout include:  Obesity.  High blood pressure.  High cholesterol.  Diabetes. Not everyone with elevated uric acid levels gets gout. It is not understood why some people get gout and others do not. Surgery, joint injury, and eating too much of certain foods are some of the factors that can lead to gout attacks. SYMPTOMS   An attack of gout comes on quickly. It causes  intense pain with redness, swelling, and warmth in a joint.  Fever can occur.  Often, only one joint is involved. Certain joints are more commonly involved:  Base of the big toe.  Knee.  Ankle.  Wrist.  Finger. Without treatment, an attack usually goes away in a few days to weeks. Between attacks, you usually will not have symptoms, which is different from many other forms of arthritis. DIAGNOSIS  Your caregiver will suspect gout based on your symptoms and exam. In some cases, tests may be recommended. The tests may include:  Blood tests.  Urine tests.  X-rays.  Joint fluid exam. This exam requires a needle to remove fluid from the joint (arthrocentesis). Using a microscope, gout is confirmed when uric acid crystals are seen in the joint fluid. TREATMENT  There are two phases to gout treatment: treating the sudden onset (acute) attack and preventing attacks (prophylaxis).  Treatment of an Acute Attack.  Medicines are used. These include anti-inflammatory medicines or steroid medicines.  An injection of steroid medicine into the affected joint is sometimes necessary.  The painful joint is rested. Movement can worsen the arthritis.  You may use warm or cold treatments on painful joints, depending which works best for you.  Treatment to Prevent Attacks.  If you suffer from frequent gout attacks, your caregiver may advise preventive medicine. These medicines are started after the acute attack subsides. These medicines either help your kidneys eliminate uric acid from your body or decrease your uric acid production. You may need to stay on these medicines for a very long time.  The early phase of treatment  with preventive medicine can be associated with an increase in acute gout attacks. For this reason, during the first few months of treatment, your caregiver may also advise you to take medicines usually used for acute gout treatment. Be sure you understand your caregiver's  directions. Your caregiver may make several adjustments to your medicine dose before these medicines are effective.  Discuss dietary treatment with your caregiver or dietitian. Alcohol and drinks high in sugar and fructose and foods such as meat, poultry, and seafood can increase uric acid levels. Your caregiver or dietitian can advise you on drinks and foods that should be limited. HOME CARE INSTRUCTIONS   Do not take aspirin to relieve pain. This raises uric acid levels.  Only take over-the-counter or prescription medicines for pain, discomfort, or fever as directed by your caregiver.  Rest the joint as much as possible. When in bed, keep sheets and blankets off painful areas.  Keep the affected joint raised (elevated).  Apply warm or cold treatments to painful joints. Use of warm or cold treatments depends on which works best for you.  Use crutches if the painful joint is in your leg.  Drink enough fluids to keep your urine clear or pale yellow. This helps your body get rid of uric acid. Limit alcohol, sugary drinks, and fructose drinks.  Follow your dietary instructions. Pay careful attention to the amount of protein you eat. Your daily diet should emphasize fruits, vegetables, whole grains, and fat-free or low-fat milk products. Discuss the use of coffee, vitamin C, and cherries with your caregiver or dietitian. These may be helpful in lowering uric acid levels.  Maintain a healthy body weight. SEEK MEDICAL CARE IF:   You develop diarrhea, vomiting, or any side effects from medicines.  You do not feel better in 24 hours, or you are getting worse. SEEK IMMEDIATE MEDICAL CARE IF:   Your joint becomes suddenly more tender, and you have chills or a fever. MAKE SURE YOU:   Understand these instructions.  Will watch your condition.  Will get help right away if you are not doing well or get worse.   This information is not intended to replace advice given to you by your health  care provider. Make sure you discuss any questions you have with your health care provider.   Document Released: 12/24/1999 Document Revised: 01/16/2014 Document Reviewed: 08/09/2011 Elsevier Interactive Patient Education [employer].

## 2015-07-12 NOTE — Assessment & Plan Note (Signed)
Left acute podagra - treat with prednisone taper, colchicine.  After acute flare has resolved, increase allopurinol to 300mg  daily. Pt agrees with plan. RTC for CPE with fasting labs.

## 2015-10-19 ENCOUNTER — Encounter: Payer: BLUE CROSS/BLUE SHIELD | Admitting: Family Medicine

## 2015-12-03 ENCOUNTER — Other Ambulatory Visit: Payer: Self-pay | Admitting: Primary Care

## 2015-12-03 DIAGNOSIS — Z8739 Personal history of other diseases of the musculoskeletal system and connective tissue: Secondary | ICD-10-CM

## 2016-03-03 ENCOUNTER — Encounter: Payer: Self-pay | Admitting: Internal Medicine

## 2016-03-03 ENCOUNTER — Ambulatory Visit (INDEPENDENT_AMBULATORY_CARE_PROVIDER_SITE_OTHER): Payer: BLUE CROSS/BLUE SHIELD | Admitting: Internal Medicine

## 2016-03-03 VITALS — BP 116/74 | HR 78 | Temp 98.3°F | Wt 272.0 lb

## 2016-03-03 DIAGNOSIS — J302 Other seasonal allergic rhinitis: Secondary | ICD-10-CM | POA: Diagnosis not present

## 2016-03-03 MED ORDER — ALLOPURINOL 300 MG PO TABS: 300.0000 mg | ORAL_TABLET | Freq: Every day | ORAL | 1 refills | Status: DC

## 2016-03-03 MED ORDER — FLUTICASONE PROPIONATE 50 MCG/ACT NA SUSP: 2.0000 | Freq: Every day | NASAL | 6 refills | Status: DC

## 2016-03-03 NOTE — Patient Instructions (Signed)
Allergic Rhinitis Allergic rhinitis is when the mucous membranes in the nose respond to allergens. Allergens are particles in the air that cause your body to have an allergic reaction. This causes you to release allergic antibodies. Through a chain of events, these eventually cause you to release histamine into the blood stream. Although meant to protect the body, it is this release of histamine that causes your discomfort, such as frequent sneezing, congestion, and an itchy, runny nose. What are the causes? Seasonal allergic rhinitis (hay fever) is caused by pollen allergens that may come from grasses, trees, and weeds. Year-round allergic rhinitis (perennial allergic rhinitis) is caused by allergens such as house dust mites, pet dander, and mold spores. What are the signs or symptoms?  Nasal stuffiness (congestion).  Itchy, runny nose with sneezing and tearing of the eyes. How is this diagnosed? Your health care provider can help you determine the allergen or allergens that trigger your symptoms. If you and your health care provider are unable to determine the allergen, skin or blood testing may be used. Your health care provider will diagnose your condition after taking your health history and performing a physical exam. Your health care provider may assess you for other related conditions, such as asthma, pink eye, or an ear infection. How is this treated? Allergic rhinitis does not have a cure, but it can be controlled by:  Medicines that block allergy symptoms. These may include allergy shots, nasal sprays, and oral antihistamines.  Avoiding the allergen. Hay fever may often be treated with antihistamines in pill or nasal spray forms. Antihistamines block the effects of histamine. There are over-the-counter medicines that may help with nasal congestion and swelling around the eyes. Check with your health care provider before taking or giving this medicine. If avoiding the allergen or the  medicine prescribed do not work, there are many new medicines your health care provider can prescribe. Stronger medicine may be used if initial measures are ineffective. Desensitizing injections can be used if medicine and avoidance does not work. Desensitization is when a patient is given ongoing shots until the body becomes less sensitive to the allergen. Make sure you follow up with your health care provider if problems continue. Follow these instructions at home: It is not possible to completely avoid allergens, but you can reduce your symptoms by taking steps to limit your exposure to them. It helps to know exactly what you are allergic to so that you can avoid your specific triggers. Contact a health care provider if:  You have a fever.  You develop a cough that does not stop easily (persistent).  You have shortness of breath.  You start wheezing.  Symptoms interfere with normal daily activities. This information is not intended to replace advice given to you by your health care provider. Make sure you discuss any questions you have with your health care provider. Document Released: 09/20/2000 Document Revised: 08/27/2015 Document Reviewed: 09/02/2012 Elsevier Interactive Patient Education  2017 [employer]  

## 2016-03-03 NOTE — Progress Notes (Signed)
Subjective:    Patient ID: Bruce Wang, male    DOB: 09/14/1965, 51 y.o.   MRN: [id number]  HPI  Pt presents to the clinic today with intermittent nasal congestion and runny nose. This started 2-5 months ago. When he does blow something out of his nose, it is clear. He denies headaches, ear pain, sore throat or cough. He denies fever, chills or body aches. 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 He has not had sick contacts.  Review of Systems      Past Medical History:  Diagnosis Date  . 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 History of chicken pox     Current Outpatient Prescriptions  Medication Sig Dispense Refill  . allopurinol (ZYLOPRIM) 300 MG tablet Take 1 tablet (300 mg total) by mouth daily. 90 tablet 1  . colchicine 0.6 MG tablet Take 1 tablet (0.6 mg total) by mouth daily as needed. (Patient not taking: Reported on 03/03/2016) 30 tablet 1   No current facility-administered medications for this visit.     No Known Allergies  Family History  Problem Relation Age of Onset  . Hypertension Mother   . Hypertension Sister   . Cancer Paternal Uncle 4    colon cancer, prostate cancer  . Dementia Maternal Grandmother   . Parkinsonism Maternal Grandfather   . Coronary artery disease Neg Hx   . Stroke Neg Hx   . Diabetes Neg Hx   . Dementia Maternal Grandmother   . Parkinson's disease Maternal Grandfather     Social History   Social History  . Marital status: Married    Spouse name: N/A  . Number of children: N/A  . Years of education: N/A   Occupational History  . Not on file.   Social History Main Topics  . Smoking status: Former Smoker    Quit date: 01/09/2010  . Smokeless tobacco: Never Used  . Alcohol use Yes     Comment: Regular on weekends  . Drug use: No  . Sexual activity: Yes     Comment: wife   Other Topics Concern  . Not on file   Social History Narrative   Caffeine: 1 pot coffee/day   Lives with wife, 2  children, 2 dogs   Occupation: Editor, commissioning for [employer]   Edu: 2 yrs college   Activity: walking 2 mi/day   Diet: good water, fruits/vegetables daily, red meat 2x/wk, never fish.     alternates between low carb and eating healthy.       Constitutional: Denies fever, malaise, fatigue, headache or abrupt weight changes.  HEENT: Pt reports runny nose, nasal congestion. Denies eye pain, eye redness, ear pain, ringing in the ears, wax buildup, bloody nose, or sore throat. Respiratory: Denies difficulty breathing, shortness of breath, cough or sputum production.    No other specific complaints in a complete review of systems (except as listed in HPI above).  Objective:   Physical Exam   BP 116/74   Pulse 78   Temp 98.3 F (36.8 C) (Oral)   Wt 272 lb (123.4 kg)   SpO2 98%   BMI 36.89 kg/m  Wt Readings from Last 3 Encounters:  03/03/16 272 lb (123.4 kg)  07/12/15 286 lb 4 oz (129.8 kg)  06/08/15 277 lb 8 oz (125.9 kg)    General: Appears his stated age, obese in NAD. HEENT: Nose: mucosa boggy and moist, septum midline Pulmonary/Chest: Normal effort and positive  vesicular breath sounds. No respiratory distress. No wheezes, rales or ronchi noted.    BMET    Component Value Date/Time   NA 140 03/16/2011 1034   K 4.7 03/16/2011 1034   CL 103 03/16/2011 1034   CO2 28 03/16/2011 1034   GLUCOSE 97 03/16/2011 1034   BUN 16 03/16/2011 1034   CREATININE 1.0 03/16/2011 1034   CALCIUM 9.8 03/16/2011 1034    Lipid Panel     Component Value Date/Time   CHOL 143 02/03/2011   TRIG 67 02/03/2011   HDL 61 02/03/2011    CBC    Component Value Date/Time   WBC 4.1 (L) 03/16/2011 1034   RBC 4.98 03/16/2011 1034   HGB 14.3 03/16/2011 1034   HCT 43.1 03/16/2011 1034   PLT 212.0 03/16/2011 1034   MCV 86.5 03/16/2011 1034   MCHC 33.3 03/16/2011 1034   RDW 14.0 03/16/2011 1034   LYMPHSABS 1.6 03/16/2011 1034   MONOABS 0.3 03/16/2011 1034   EOSABS 0.1 03/16/2011 1034    BASOSABS 0.0 03/16/2011 1034    Hgb A1C No results found for: HGBA1C         Assessment & Plan:   Allergic Rhinitis:  Start Flonase OTC daily  RTC as needed or if symptoms persist or worsen BAITY, REGINA, NP

## 2016-03-06 ENCOUNTER — Telehealth: Payer: Self-pay

## 2016-03-06 NOTE — Telephone Encounter (Signed)
FYI to Avie Echevaria NP.

## 2016-03-06 NOTE — Telephone Encounter (Signed)
PLEASE NOTE: All timestamps contained within this report are represented as [place] Standard Time. CONFIDENTIALTY NOTICE: This fax transmission is intended only for the addressee. It contains information that is legally privileged, confidential or otherwise protected from use or disclosure. If you are not the intended recipient, you are strictly prohibited from reviewing, disclosing, copying using or disseminating any of this information or taking any action in reliance on or regarding this information. If you have received this fax in error, please notify us immediately by telephone so that we can arrange for its return to [place]. Phone: [phone], Toll-Free: [phone], Fax: [phone] Page: 1 of 1 Call Id: [id number] [place] Patient Name: Bruce Wang Gender: Male DOB: 11-Oct-1965 Age: 51 Y 47 M 11 D Return Phone Number: [phone] (Primary) City/State/Zip: [place] [address] Client [facility] - Client Client Site [place] Physician Webb Silversmith - NP Who Is Calling Patient / Member / Family / Caregiver Call Type Triage / Clinical Relationship To Patient Self Return Phone Number [phone] (Primary) Chief Complaint BREATHING - shortness of breath or sounds breathless Reason for Call Symptomatic / Request for [place] says he is needing his prescriptions that were suppose to be called in today that are not at the pharmacy. Symptoms: congestion and has a really hard time breathing (sounds really congested over the phone) Albuteral and a nasal spray to help with breathing is what was suppose to be at the pharmacy Nurse Assessment Nurse: Georgina Peer, RN, Kendrick Fries Date/Time Eilene Ghazi Time): 03/03/2016 5:51:22 PM Confirm and document reason for call. If symptomatic, describe symptoms. ---Needs the  Alopurinal & nasal steroid RX that the Dr was supposed to call in for him. He was seen in the office today for his s/s & doesn't need to have them triaged now. He uses the [facility] @ [phone]. Please document clinical information provided and list any resource used. ---Checked with this [facility] states they do have his RX in process right now. Pt informed. Guidelines Guideline Title Affirmed Question Disp. Time Eilene Ghazi Time) Disposition Final User 03/03/2016 5:59:14 PM Clinical Call Yes Georgina Peer, RN, Kendrick Fries

## 2016-03-13 ENCOUNTER — Other Ambulatory Visit: Payer: Self-pay | Admitting: Family Medicine

## 2016-03-13 DIAGNOSIS — J302 Other seasonal allergic rhinitis: Secondary | ICD-10-CM

## 2016-05-23 ENCOUNTER — Other Ambulatory Visit (INDEPENDENT_AMBULATORY_CARE_PROVIDER_SITE_OTHER): Payer: BLUE CROSS/BLUE SHIELD

## 2016-05-23 ENCOUNTER — Other Ambulatory Visit: Payer: Self-pay | Admitting: Family Medicine

## 2016-05-23 DIAGNOSIS — Z9884 Bariatric surgery status: Secondary | ICD-10-CM | POA: Diagnosis not present

## 2016-05-23 DIAGNOSIS — M1A0720 Idiopathic chronic gout, left ankle and foot, without tophus (tophi): Secondary | ICD-10-CM | POA: Diagnosis not present

## 2016-05-23 LAB — CBC WITH DIFFERENTIAL/PLATELET
Basophils Absolute: 0 10*3/uL (ref 0.0–0.1)
Basophils Relative: 0.8 % (ref 0.0–3.0)
EOS PCT: 7.7 % — AB (ref 0.0–5.0)
Eosinophils Absolute: 0.3 10*3/uL (ref 0.0–0.7)
HCT: 40 % (ref 39.0–52.0)
HEMOGLOBIN: 13.3 g/dL (ref 13.0–17.0)
Lymphocytes Relative: 45.2 % (ref 12.0–46.0)
Lymphs Abs: 1.7 10*3/uL (ref 0.7–4.0)
MCHC: 33.3 g/dL (ref 30.0–36.0)
MCV: 88.7 fl (ref 78.0–100.0)
MONO ABS: 0.3 10*3/uL (ref 0.1–1.0)
Monocytes Relative: 7.3 % (ref 3.0–12.0)
Neutro Abs: 1.5 10*3/uL (ref 1.4–7.7)
Neutrophils Relative %: 39 % — ABNORMAL LOW (ref 43.0–77.0)
Platelets: 245 10*3/uL (ref 150.0–400.0)
RBC: 4.51 Mil/uL (ref 4.22–5.81)
RDW: 14.8 % (ref 11.5–15.5)
WBC: 3.8 10*3/uL — AB (ref 4.0–10.5)

## 2016-05-23 LAB — LIPID PANEL
CHOLESTEROL: 147 mg/dL (ref 0–200)
HDL: 63.9 mg/dL (ref >39.00)
LDL Cholesterol: 57 mg/dL (ref 0–99)
NonHDL: 83.53
TRIGLYCERIDES: 134 mg/dL (ref 0.0–149.0)
Total CHOL/HDL Ratio: 2
VLDL: 26.8 mg/dL (ref 0.0–40.0)

## 2016-05-23 LAB — TSH: TSH: 1.59 u[IU]/mL (ref 0.35–4.50)

## 2016-05-23 LAB — COMPREHENSIVE METABOLIC PANEL
ALBUMIN: 4.1 g/dL (ref 3.5–5.2)
ALK PHOS: 80 U/L (ref 39–117)
ALT: 25 U/L (ref 0–53)
AST: 26 U/L (ref 0–37)
BUN: 12 mg/dL (ref 6–23)
CALCIUM: 9.1 mg/dL (ref 8.4–10.5)
CO2: 26 mEq/L (ref 19–32)
Chloride: 109 mEq/L (ref 96–112)
Creatinine, Ser: 0.94 mg/dL (ref 0.40–1.50)
GFR: 90.38 mL/min (ref >60.00)
Glucose, Bld: 88 mg/dL (ref 70–99)
POTASSIUM: 3.7 meq/L (ref 3.5–5.1)
SODIUM: 142 meq/L (ref 135–145)
TOTAL PROTEIN: 6.5 g/dL (ref 6.0–8.3)
Total Bilirubin: 0.4 mg/dL (ref 0.2–1.2)

## 2016-05-23 LAB — VITAMIN D 25 HYDROXY (VIT D DEFICIENCY, FRACTURES): VITD: 18.17 ng/mL — ABNORMAL LOW (ref 30.00–100.00)

## 2016-05-23 LAB — URIC ACID: URIC ACID, SERUM: 4.7 mg/dL (ref 4.0–7.8)

## 2016-05-23 LAB — FERRITIN: FERRITIN: 15.7 ng/mL — AB (ref 22.0–322.0)

## 2016-05-23 LAB — VITAMIN B12: VITAMIN B 12: 172 pg/mL — AB (ref 211–911)

## 2016-05-30 ENCOUNTER — Encounter: Payer: Self-pay | Admitting: Family Medicine

## 2016-05-30 ENCOUNTER — Ambulatory Visit (INDEPENDENT_AMBULATORY_CARE_PROVIDER_SITE_OTHER): Payer: BLUE CROSS/BLUE SHIELD | Admitting: Family Medicine

## 2016-05-30 VITALS — BP 122/80 | HR 80 | Temp 98.9°F | Ht 72.0 in | Wt 273.5 lb

## 2016-05-30 DIAGNOSIS — E559 Vitamin D deficiency, unspecified: Secondary | ICD-10-CM

## 2016-05-30 DIAGNOSIS — M1A0720 Idiopathic chronic gout, left ankle and foot, without tophus (tophi): Secondary | ICD-10-CM | POA: Diagnosis not present

## 2016-05-30 DIAGNOSIS — N529 Male erectile dysfunction, unspecified: Secondary | ICD-10-CM

## 2016-05-30 DIAGNOSIS — Z0000 Encounter for general adult medical examination without abnormal findings: Secondary | ICD-10-CM | POA: Diagnosis not present

## 2016-05-30 DIAGNOSIS — E538 Deficiency of other specified B group vitamins: Secondary | ICD-10-CM | POA: Diagnosis not present

## 2016-05-30 DIAGNOSIS — Z9884 Bariatric surgery status: Secondary | ICD-10-CM

## 2016-05-30 DIAGNOSIS — Z1211 Encounter for screening for malignant neoplasm of colon: Secondary | ICD-10-CM

## 2016-05-30 DIAGNOSIS — IMO0001 Reserved for inherently not codable concepts without codable children: Secondary | ICD-10-CM

## 2016-05-30 DIAGNOSIS — E669 Obesity, unspecified: Secondary | ICD-10-CM | POA: Diagnosis not present

## 2016-05-30 MED ORDER — CYANOCOBALAMIN 1000 MCG/ML IJ SOLN
1000.0000 ug | Freq: Once | INTRAMUSCULAR | Status: AC
  Administered 2016-05-30: 1000 ug via INTRAMUSCULAR

## 2016-05-30 MED ORDER — TADALAFIL 20 MG PO TABS: 20.0000 mg | ORAL_TABLET | ORAL | 3 refills | Status: DC | PRN

## 2016-05-30 NOTE — Assessment & Plan Note (Addendum)
Ongoing over months. Trouble maintaining erection. Discussed anticipated cause of this. Discussed PDE5 inhibitors - pt states cialis is only option covered by insurance. Discussed generic viagra. Discussed letting us know if any chest pain, dyspnea, and to monitor for priapism. To monitor for HA. Pt aware may not take any nitrates while on cialis. Rx cilais 10-20mg  QOD PRN - start locally then consider [facility] mail order.

## 2016-05-30 NOTE — Patient Instructions (Addendum)
B12 shot today then start b12 1025mcg over the counter daily. Start vit D 2000 units over the counter daily.  Consider iron tablet daily as your stores were low.  Try 1/2 tablet allopurinol daily.   cialis sent to local pharmacy.  Return in 1 year for next physical, sooner if needed.  Health Maintenance, Male A healthy lifestyle and preventive care is important for your health and wellness. Ask your health care provider about what schedule of regular examinations is right for you. What should I know about weight and diet?  Eat a Healthy Diet  Eat plenty of vegetables, fruits, whole grains, low-fat dairy products, and lean protein.  Do not eat a lot of foods high in solid fats, added sugars, or salt. Maintain a Healthy Weight  Regular exercise can help you achieve or maintain a healthy weight. You should:  Do at least 150 minutes of exercise each week. The exercise should increase your heart rate and make you sweat (moderate-intensity exercise).  Do strength-training exercises at least twice a week. Watch Your Levels of Cholesterol and Blood Lipids  Have your blood tested for lipids and cholesterol every 5 years starting at 50 years of age. If you are at high risk for heart disease, you should start having your blood tested when you are 50 years old. You may need to have your cholesterol levels checked more often if:  Your lipid or cholesterol levels are high.  You are older than 50 years of age.  You are at high risk for heart disease. What should I know about cancer screening? Many types of cancers can be detected early and may often be prevented. Lung Cancer  You should be screened every year for lung cancer if:  You are a current smoker who has smoked for at least 30 years.  You are a former smoker who has quit within the past 15 years.  Talk to your health care provider about your screening options, when you should start screening, and how often you should be  screened. Colorectal Cancer  Routine colorectal cancer screening usually begins at 50 years of age and should be repeated every 5-10 years until you are 50 years old. You may need to be screened more often if early forms of precancerous polyps or small growths are found. Your health care provider may recommend screening at an earlier age if you have risk factors for colon cancer.  Your health care provider may recommend using home test kits to check for hidden blood in the stool.  A small camera at the end of a tube can be used to examine your colon (sigmoidoscopy or colonoscopy). This checks for the earliest forms of colorectal cancer. Prostate and Testicular Cancer  Depending on your age and overall health, your health care provider may do certain tests to screen for prostate and testicular cancer.  Talk to your health care provider about any symptoms or concerns you have about testicular or prostate cancer. Skin Cancer  Check your skin from head to toe regularly.  Tell your health care provider about any new moles or changes in moles, especially if:  There is a change in a mole's size, shape, or color.  You have a mole that is larger than a pencil eraser.  Always use sunscreen. Apply sunscreen liberally and repeat throughout the day.  Protect yourself by wearing long sleeves, pants, a wide-brimmed hat, and sunglasses when outside. What should I know about heart disease, diabetes, and high blood pressure?  If you are 53-65 years of age, have your blood pressure checked every 3-5 years. If you are 32 years of age or older, have your blood pressure checked every year. You should have your blood pressure measured twice-once when you are at a hospital or clinic, and once when you are not at a hospital or clinic. Record the average of the two measurements. To check your blood pressure when you are not at a hospital or clinic, you can use:  An automated blood pressure machine at a  pharmacy.  A home blood pressure monitor.  Talk to your health care provider about your target blood pressure.  If you are between 53-77 years old, ask your health care provider if you should take aspirin to prevent heart disease.  Have regular diabetes screenings by checking your fasting blood sugar level.  If you are at a normal weight and have a low risk for diabetes, have this test once every three years after the age of 30.  If you are overweight and have a high risk for diabetes, consider being tested at a younger age or more often.  A one-time screening for abdominal aortic aneurysm (AAA) by ultrasound is recommended for men aged 51-75 years who are current or former smokers. What should I know about preventing infection? Hepatitis B  If you have a higher risk for hepatitis B, you should be screened for this virus. Talk with your health care provider to find out if you are at risk for hepatitis B infection. Hepatitis C  Blood testing is recommended for:  Everyone born from [birth year] through 1964.  Anyone with known risk factors for hepatitis C. Sexually Transmitted Diseases (STDs)  You should be screened each year for STDs including gonorrhea and chlamydia if:  You are sexually active and are younger than 50 years of age.  You are older than 50 years of age and your health care provider tells you that you are at risk for this type of infection.  Your sexual activity has changed since you were last screened and you are at an increased risk for chlamydia or gonorrhea. Ask your health care provider if you are at risk.  Talk with your health care provider about whether you are at high risk of being infected with HIV. Your health care provider may recommend a prescription medicine to help prevent HIV infection. What else can I do?  Schedule regular health, dental, and eye exams.  Stay current with your vaccines (immunizations).  Do not use any tobacco products, such as  cigarettes, chewing tobacco, and e-cigarettes. If you need help quitting, ask your health care provider.  Limit alcohol intake to no more than 2 drinks per day. One drink equals 12 ounces of beer, 5 ounces of wine, or 1 ounces of hard liquor.  Do not use street drugs.  Do not share needles.  Ask your health care provider for help if you need support or information about quitting drugs.  Tell your health care provider if you often feel depressed.  Tell your health care provider if you have ever been abused or do not feel safe at home. This information is not intended to replace advice given to you by your health care provider. Make sure you discuss any questions you have with your health care provider. Document Released: 06/24/2007 Document Revised: 08/25/2015 Document Reviewed: 09/29/2014 Elsevier Interactive Patient Education  2017 [employer].

## 2016-05-30 NOTE — Assessment & Plan Note (Signed)
Discussed healthy diet and lifestyle changes to affect sustainable weight loss. Discussed keto diet.

## 2016-05-30 NOTE — Assessment & Plan Note (Signed)
b12 shot today then start 1065mcg daily. Discussed may need parenteral replacement due to h/o gastric bypass

## 2016-05-30 NOTE — Assessment & Plan Note (Signed)
Preventative protocols reviewed and updated unless pt declined. Discussed healthy diet and lifestyle.  

## 2016-05-30 NOTE — Assessment & Plan Note (Signed)
Reviewed recent labs. Discussed importance of regular vitamin supplementation.

## 2016-05-30 NOTE — Assessment & Plan Note (Signed)
Great control on current regimen. Reviewed low urate diet. Will decrease allopurinol to 150mg  daily.

## 2016-05-30 NOTE — Progress Notes (Signed)
BP 122/80   Pulse 80   Temp 98.9 F (37.2 C)   Ht 6' (1.829 m)   Wt 273 lb 8 oz (124.1 kg)   SpO2 93%   BMI 37.09 kg/m    CC: CPE Subjective:    Patient ID: Bruce Wang, male    DOB: 11-May-1965, 51 y.o.   MRN: [id number]  HPI: Bruce Wang is a 52 y.o. male presenting on 05/30/2016 for Annual Exam   Started keto-diet 2 wks ago.   Preventative: Colon cancer screening - discussed. Interested in colonoscopy. Referred. Flu shot - did not receive. Tdap 2013 Seat belt use discussed Sunscreen use discussed. No changing moles on skin. 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   alternates between low carb and eating healthy.    Relevant past medical, surgical, family and social history reviewed and updated as indicated. Interim medical history since our last visit reviewed. Allergies and medications reviewed and updated. Outpatient Medications Prior to Visit  Medication Sig Dispense Refill  . fluticasone (FLONASE) 50 MCG/ACT nasal spray Place 2 sprays into both nostrils daily. 16 g 6  . allopurinol (ZYLOPRIM) 300 MG tablet TAKE 1 TABLET BY MOUTH DAILY 90 tablet 0  . colchicine 0.6 MG tablet Take 1 tablet (0.6 mg total) by mouth daily as needed. (Patient not taking: Reported on 03/03/2016) 30 tablet 1   No facility-administered medications prior to visit.      Per HPI unless specifically indicated in ROS section below Review of Systems  Constitutional: Negative for activity change, appetite change, chills, fatigue, fever and unexpected weight change.  HENT: Negative for hearing loss.   Eyes: Negative for visual disturbance.  Respiratory: Negative for cough, chest tightness, shortness of breath and wheezing.   Cardiovascular: Negative for chest pain,  palpitations and leg swelling.  Gastrointestinal: Negative for abdominal distention, abdominal pain, blood in stool, constipation, diarrhea, nausea and vomiting.  Genitourinary: Negative for difficulty urinating and hematuria.  Musculoskeletal: Negative for arthralgias, myalgias and neck pain.  Skin: Negative for rash.  Neurological: Negative for dizziness, seizures, syncope and headaches.  Hematological: Negative for adenopathy. Does not bruise/bleed easily.  Psychiatric/Behavioral: Negative for dysphoric mood. The patient is not nervous/anxious.        Objective:    BP 122/80   Pulse 80   Temp 98.9 F (37.2 C)   Ht 6' (1.829 m)   Wt 273 lb 8 oz (124.1 kg)   SpO2 93%   BMI 37.09 kg/m   Wt Readings from Last 3 Encounters:  05/30/16 273 lb 8 oz (124.1 kg)  03/03/16 272 lb (123.4 kg)  07/12/15 286 lb 4 oz (129.8 kg)    Physical Exam  Constitutional: He is oriented to person, place, and time. He appears well-developed and well-nourished. No distress.  HENT:  Head: Normocephalic and atraumatic.  Right Ear: Hearing, tympanic membrane, external ear and ear canal normal.  Left Ear: Hearing, tympanic membrane, external ear and ear canal normal.  Nose: Nose normal.  Mouth/Throat: Uvula is midline, oropharynx is clear and moist and mucous membranes are normal. No oropharyngeal exudate, posterior oropharyngeal edema or posterior oropharyngeal erythema.  Eyes: Conjunctivae and EOM are normal. Pupils are equal, round, and reactive to light. No scleral icterus.  Neck: Normal range of motion.  Neck supple. No thyromegaly present.  Cardiovascular: Normal rate, regular rhythm, normal heart sounds and intact distal pulses.   No murmur heard. Pulses:      Radial pulses are 2+ on the right side, and 2+ on the left side.  Pulmonary/Chest: Effort normal and breath sounds normal. No respiratory distress. He has no wheezes. He has no rales.  Abdominal: Soft. Bowel sounds are normal. He exhibits no  distension and no mass. There is no tenderness. There is no rebound and no guarding.  Musculoskeletal: Normal range of motion. He exhibits no edema.  Lymphadenopathy:    He has no cervical adenopathy.  Neurological: He is alert and oriented to person, place, and time.  CN grossly intact, station and gait intact  Skin: Skin is warm and dry. No rash noted.  Psychiatric: He has a normal mood and affect. His behavior is normal. Judgment and thought content normal.  Nursing note and vitals reviewed.  Results for orders placed or performed in visit on 05/23/16  Lipid panel  Result Value Ref Range   Cholesterol 147 0 - 200 mg/dL   Triglycerides 134.0 0.0 - 149.0 mg/dL   HDL 63.90 >39.00 mg/dL   VLDL 26.8 0.0 - 40.0 mg/dL   LDL Cholesterol 57 0 - 99 mg/dL   Total CHOL/HDL Ratio 2    NonHDL 83.53   Comprehensive metabolic panel  Result Value Ref Range   Sodium 142 135 - 145 mEq/L   Potassium 3.7 3.5 - 5.1 mEq/L   Chloride 109 96 - 112 mEq/L   CO2 26 19 - 32 mEq/L   Glucose, Bld 88 70 - 99 mg/dL   BUN 12 6 - 23 mg/dL   Creatinine, Ser 0.94 0.40 - 1.50 mg/dL   Total Bilirubin 0.4 0.2 - 1.2 mg/dL   Alkaline Phosphatase 80 39 - 117 U/L   AST 26 0 - 37 U/L   ALT 25 0 - 53 U/L   Total Protein 6.5 6.0 - 8.3 g/dL   Albumin 4.1 3.5 - 5.2 g/dL   Calcium 9.1 8.4 - 10.5 mg/dL   GFR 90.38 >60.00 mL/min  CBC with Differential/Platelet  Result Value Ref Range   WBC 3.8 (L) 4.0 - 10.5 K/uL   RBC 4.51 4.22 - 5.81 Mil/uL   Hemoglobin 13.3 13.0 - 17.0 g/dL   HCT 40.0 39.0 - 52.0 %   MCV 88.7 78.0 - 100.0 fl   MCHC 33.3 30.0 - 36.0 g/dL   RDW 14.8 11.5 - 15.5 %   Platelets 245.0 150.0 - 400.0 K/uL   Neutrophils Relative % 39.0 (L) 43.0 - 77.0 %   Lymphocytes Relative 45.2 12.0 - 46.0 %   Monocytes Relative 7.3 3.0 - 12.0 %   Eosinophils Relative 7.7 (H) 0.0 - 5.0 %   Basophils Relative 0.8 0.0 - 3.0 %   Neutro Abs 1.5 1.4 - 7.7 K/uL   Lymphs Abs 1.7 0.7 - 4.0 K/uL   Monocytes Absolute 0.3  0.1 - 1.0 K/uL   Eosinophils Absolute 0.3 0.0 - 0.7 K/uL   Basophils Absolute 0.0 0.0 - 0.1 K/uL  Uric acid  Result Value Ref Range   Uric Acid, Serum 4.7 4.0 - 7.8 mg/dL  Vitamin B12  Result Value Ref Range   Vitamin B-12 172 (L) 211 - 911 pg/mL  VITAMIN D 25 Hydroxy (Vit-D Deficiency, Fractures)  Result Value Ref Range   VITD 18.17 (L) 30.00 - 100.00 ng/mL  Ferritin  Result Value Ref Range   Ferritin  15.7 (L) 22.0 - 322.0 ng/mL  TSH  Result Value Ref Range   TSH 1.59 0.35 - 4.50 uIU/mL      Assessment & Plan:   Problem List Items Addressed This Visit    Erectile dysfunction    Ongoing over months. Trouble maintaining erection. Discussed anticipated cause of this. Discussed PDE5 inhibitors - pt states cialis is only option covered by insurance. Discussed generic viagra. Discussed letting us know if any chest pain, dyspnea, and to monitor for priapism. To monitor for HA. Pt aware may not take any nitrates while on cialis. Rx cilais 10-20mg  QOD PRN - start locally then consider [facility] mail order.       Gastric bypass status for obesity    Reviewed recent labs. Discussed importance of regular vitamin supplementation.      Gout    Great control on current regimen. Reviewed low urate diet. Will decrease allopurinol to 150mg  daily.       Health maintenance examination - Primary    Preventative protocols reviewed and updated unless pt declined. Discussed healthy diet and lifestyle.       Obesity, Class II, BMI 35-39.9, with comorbidity    Discussed healthy diet and lifestyle changes to affect sustainable weight loss. Discussed keto diet.       Vitamin B12 deficiency    b12 shot today then start 1083mcg daily. Discussed may need parenteral replacement due to h/o gastric bypass      Vitamin D deficiency    Start 2000 IU daily.        Other Visit Diagnoses    Special screening for malignant neoplasms, colon       Relevant Orders   Ambulatory referral to  Gastroenterology       Follow up plan: Return in about 1 year (around 05/30/2017), or if symptoms worsen or fail to improve, for annual exam, prior fasting for blood work.  [name], MD

## 2016-05-30 NOTE — Assessment & Plan Note (Addendum)
Start 2000 IU daily.  

## 2016-06-22 ENCOUNTER — Telehealth: Payer: Self-pay

## 2016-06-22 ENCOUNTER — Other Ambulatory Visit: Payer: Self-pay

## 2016-06-22 DIAGNOSIS — Z1211 Encounter for screening for malignant neoplasm of colon: Secondary | ICD-10-CM

## 2016-06-22 NOTE — Telephone Encounter (Signed)
Gastroenterology Pre-Procedure Review  Request Date: 07/20/16 Requesting Physician: Dr. Allen Norris  PATIENT REVIEW QUESTIONS: The patient responded to the following health history questions as indicated:    1. Are you having any GI issues? no 2. Do you have a personal history of Polyps? no 3. Do you have a family history of Colon Cancer or Polyps? yes (Uncle had colon cancer) 4. Diabetes Mellitus? no 5. Joint replacements in the past 12 months?no 6. Major health problems in the past 3 months?no 7. Any artificial heart valves, MVP, or defibrillator?no    MEDICATIONS & ALLERGIES:    Patient reports the following regarding taking any anticoagulation/antiplatelet therapy:   Plavix, Coumadin, Eliquis, Xarelto, Lovenox, Pradaxa, Brilinta, or Effient? no Aspirin? no  Patient confirms/reports the following medications:  Current Outpatient Prescriptions  Medication Sig Dispense Refill  . allopurinol (ZYLOPRIM) 300 MG tablet Take 0.5 tablets (150 mg total) by mouth daily.  0  . Cholecalciferol (VITAMIN D) 2000 units CAPS Take 1 capsule by mouth daily.    . fluticasone (FLONASE) 50 MCG/ACT nasal spray Place 2 sprays into both nostrils daily. 16 g 6  . tadalafil (CIALIS) 20 MG tablet Take 1 tablet (20 mg total) by mouth every other day as needed for erectile dysfunction. 5 tablet 3  . vitamin B-12 (CYANOCOBALAMIN) 1000 MCG tablet Take 1,000 mcg by mouth daily.    . vitamin C (ASCORBIC ACID) 500 MG tablet Take 500 mg by mouth daily.     No current facility-administered medications for this visit.     Patient confirms/reports the following allergies:  No Known Allergies  No orders of the defined types were placed in this encounter.   AUTHORIZATION INFORMATION Primary Insurance: 1D#: Group #:  Secondary Insurance: 1D#: Group #:  SCHEDULE INFORMATION: Date: 07/20/16  Time: Location:[facility]

## 2016-07-19 NOTE — Discharge Instructions (Signed)
General Anesthesia, Adult, Care After °These instructions provide you with information about caring for yourself after your procedure. Your health care provider may also give you more specific instructions. Your treatment has been planned according to current medical practices, but problems sometimes occur. Call your health care provider if you have any problems or questions after your procedure. °What can I expect after the procedure? °After the procedure, it is common to have: °· Vomiting. °· A sore throat. °· Mental slowness. ° °It is common to feel: °· Nauseous. °· Cold or shivery. °· Sleepy. °· Tired. °· Sore or achy, even in parts of your body where you did not have surgery. ° °Follow these instructions at home: °For at least 24 hours after the procedure: °· Do not: °? Participate in activities where you could fall or become injured. °? Drive. °? Use heavy machinery. °? Drink alcohol. °? Take sleeping pills or medicines that cause drowsiness. °? Make important decisions or sign legal documents. °? Take care of children on your own. °· Rest. °Eating and drinking °· If you vomit, drink water, juice, or soup when you can drink without vomiting. °· Drink enough fluid to keep your urine clear or pale yellow. °· Make sure you have little or no nausea before eating solid foods. °· Follow the diet recommended by your health care provider. °General instructions °· Have a responsible adult stay with you until you are awake and alert. °· Return to your normal activities as told by your health care provider. Ask your health care provider what activities are safe for you. °· Take over-the-counter and prescription medicines only as told by your health care provider. °· If you smoke, do not smoke without supervision. °· Keep all follow-up visits as told by your health care provider. This is important. °Contact a health care provider if: °· You continue to have nausea or vomiting at home, and medicines are not helpful. °· You  cannot drink fluids or start eating again. °· You cannot urinate after 8-12 hours. °· You develop a skin rash. °· You have fever. °· You have increasing redness at the site of your procedure. °Get help right away if: °· You have difficulty breathing. °· You have chest pain. °· You have unexpected bleeding. °· You feel that you are having a life-threatening or urgent problem. °This information is not intended to replace advice given to you by your health care provider. Make sure you discuss any questions you have with your health care provider. °Document Released: 04/03/2000 Document Revised: 05/31/2015 Document Reviewed: 12/10/2014 °Elsevier Interactive Patient Education © 2018 [employer] Inc[employer] ° °

## 2016-07-20 ENCOUNTER — Ambulatory Visit: Payer: BLUE CROSS/BLUE SHIELD | Admitting: Anesthesiology

## 2016-07-20 ENCOUNTER — Ambulatory Visit
Admission: RE | Admit: 2016-07-20 | Discharge: 2016-07-20 | Disposition: A | Discharge status: Home / Self Care | Payer: BLUE CROSS/BLUE SHIELD | Source: Ambulatory Visit | Attending: Gastroenterology | Admitting: Gastroenterology

## 2016-07-20 ENCOUNTER — Encounter
Admission: RE | Disposition: A | Discharge status: Home / Self Care | Payer: Self-pay | Source: Ambulatory Visit | Attending: Gastroenterology

## 2016-07-20 DIAGNOSIS — M109 Gout, unspecified: Secondary | ICD-10-CM | POA: Diagnosis not present

## 2016-07-20 DIAGNOSIS — Z79899 Other long term (current) drug therapy: Secondary | ICD-10-CM | POA: Insufficient documentation

## 2016-07-20 DIAGNOSIS — Z87891 Personal history of nicotine dependence: Secondary | ICD-10-CM | POA: Insufficient documentation

## 2016-07-20 DIAGNOSIS — K640 First degree hemorrhoids: Secondary | ICD-10-CM | POA: Insufficient documentation

## 2016-07-20 DIAGNOSIS — Z7951 Long term (current) use of inhaled steroids: Secondary | ICD-10-CM | POA: Insufficient documentation

## 2016-07-20 DIAGNOSIS — Z1211 Encounter for screening for malignant neoplasm of colon: Secondary | ICD-10-CM

## 2016-07-20 DIAGNOSIS — D122 Benign neoplasm of ascending colon: Secondary | ICD-10-CM | POA: Diagnosis not present

## 2016-07-20 DIAGNOSIS — Z6838 Body mass index (BMI) 38.0-38.9, adult: Secondary | ICD-10-CM | POA: Diagnosis not present

## 2016-07-20 DIAGNOSIS — Z9884 Bariatric surgery status: Secondary | ICD-10-CM | POA: Diagnosis not present

## 2016-07-20 HISTORY — DX: Dyspnea, unspecified: R06.00

## 2016-07-20 HISTORY — PX: COLONOSCOPY WITH PROPOFOL: SHX5780

## 2016-07-20 HISTORY — PX: POLYPECTOMY: SHX5525

## 2016-07-20 HISTORY — DX: Gout, unspecified: M10.9

## 2016-07-20 SURGERY — COLONOSCOPY WITH PROPOFOL
Anesthesia: General | Wound class: Contaminated

## 2016-07-20 MED ORDER — LIDOCAINE HCL (CARDIAC) 20 MG/ML IV SOLN
INTRAVENOUS | Status: DC | PRN
  Administered 2016-07-20: 20 mg via INTRAVENOUS

## 2016-07-20 MED ORDER — ACETAMINOPHEN 325 MG PO TABS: 325.0000 mg | ORAL_TABLET | ORAL | Status: DC | PRN

## 2016-07-20 MED ORDER — PROPOFOL 10 MG/ML IV BOLUS
INTRAVENOUS | Status: DC | PRN
  Administered 2016-07-20: 50 mg via INTRAVENOUS
  Administered 2016-07-20: 100 mg via INTRAVENOUS
  Administered 2016-07-20 (×4): 50 mg via INTRAVENOUS

## 2016-07-20 MED ORDER — STERILE WATER FOR IRRIGATION IR SOLN
Status: DC | PRN
  Administered 2016-07-20: 09:00:00

## 2016-07-20 MED ORDER — LACTATED RINGERS IV SOLN
INTRAVENOUS | Status: DC | PRN
  Administered 2016-07-20: 09:00:00 via INTRAVENOUS

## 2016-07-20 MED ORDER — ACETAMINOPHEN 160 MG/5ML PO SOLN: 325.0000 mg | ORAL | Status: DC | PRN

## 2016-07-20 SURGICAL SUPPLY — 23 items
CANISTER SUCT 1200ML W/VALVE (MISCELLANEOUS) ×3 IMPLANT
CLIP HMST 235XBRD CATH ROT (MISCELLANEOUS) IMPLANT
CLIP RESOLUTION 360 11X235 (MISCELLANEOUS)
FCP ESCP3.2XJMB 240X2.8X (MISCELLANEOUS)
FORCEPS BIOP RAD 4 LRG CAP 4 (CUTTING FORCEPS) IMPLANT
FORCEPS BIOP RJ4 240 W/NDL (MISCELLANEOUS)
FORCEPS ESCP3.2XJMB 240X2.8X (MISCELLANEOUS) IMPLANT
GOWN CVR UNV OPN BCK APRN NK (MISCELLANEOUS) ×4 IMPLANT
GOWN ISOL THUMB LOOP REG UNIV (MISCELLANEOUS) ×6
INJECTOR VARIJECT VIN23 (MISCELLANEOUS) IMPLANT
KIT DEFENDO VALVE AND CONN (KITS) IMPLANT
KIT ENDO PROCEDURE OLY (KITS) ×3 IMPLANT
MARKER SPOT ENDO TATTOO 5ML (MISCELLANEOUS) IMPLANT
PAD GROUND ADULT SPLIT (MISCELLANEOUS) IMPLANT
PROBE APC STR FIRE (PROBE) IMPLANT
RETRIEVER NET ROTH 2.5X230 LF (MISCELLANEOUS) IMPLANT
SNARE SHORT THROW 13M SML OVAL (MISCELLANEOUS) ×1 IMPLANT
SNARE SHORT THROW 30M LRG OVAL (MISCELLANEOUS) IMPLANT
SNARE SNG USE RND 15MM (INSTRUMENTS) IMPLANT
SPOT EX ENDOSCOPIC TATTOO (MISCELLANEOUS)
TRAP ETRAP POLY (MISCELLANEOUS) ×1 IMPLANT
VARIJECT INJECTOR VIN23 (MISCELLANEOUS)
WATER STERILE IRR 250ML POUR (IV SOLUTION) ×3 IMPLANT

## 2016-07-20 NOTE — Op Note (Signed)
[facility]astroenter[facility] Patient Name: Bruce Wang Procedure Date: 07/20/2016 8:54 AM MRN: [id number] Account #: [id number] Date of Birth: 09-21-1965 Admit Type: Outpatient Age: 51 Room: [facility] OR ROOM 01 Gender: Male Note Status: Finalized Procedure:            Colonoscopy Indications:          Screening for colorectal malignant neoplasm Providers:            Lucilla Lame MD, MD Referring MD:         [name] (Referring MD) Medicines:            Propofol per Anesthesia Complications:        No immediate complications. Procedure:            Pre-Anesthesia Assessment:                       - Prior to the procedure, a History and Physical was                        performed, and patient medications and allergies were                        reviewed. The patient's tolerance of previous                        anesthesia was also reviewed. The risks and benefits of                        the procedure and the sedation options and risks were                        discussed with the patient. All questions were                        answered, and informed consent was obtained. Prior                        Anticoagulants: The patient has taken no previous                        anticoagulant or antiplatelet agents. ASA Grade                        Assessment: II - A patient with mild systemic disease.                        After reviewing the risks and benefits, the patient was                        deemed in satisfactory condition to undergo the                        procedure.                       After obtaining informed consent, the colonoscope was                        passed under direct vision. Throughout the procedure,  the patient's blood pressure, pulse, and oxygen                        saturations were monitored continuously. The [place] [id number]) was introduced through the                 anus and advanced to the the cecum, identified by                        appendiceal orifice and ileocecal valve. The                        colonoscopy was performed without difficulty. The                        patient tolerated the procedure well. The quality of                        the bowel preparation was excellent. Findings:      The perianal and digital rectal examinations were normal.      A 3 mm polyp was found in the ascending colon. The polyp was sessile.       The polyp was removed with a cold biopsy forceps. Resection and       retrieval were complete.      Non-bleeding internal hemorrhoids were found during retroflexion. The       hemorrhoids were Grade I (internal hemorrhoids that do not prolapse). Impression:           - One 3 mm polyp in the ascending colon, removed with a                        cold biopsy forceps. Resected and retrieved.                       - Non-bleeding internal hemorrhoids. Recommendation:       - Discharge patient to home.                       - Resume previous diet.                       - Continue present medications.                       - Await pathology results.                       - Repeat colonoscopy in 5 years if polyp adenoma and 10                        years if hyperplastic Procedure Code(s):    --- Professional ---                       [phone], Colonoscopy, flexible; with biopsy, single or                        multiple Diagnosis Code(s):    --- Professional ---  Z12.11, Encounter for screening for malignant neoplasm                        of colon                       D12.2, Benign neoplasm of ascending colon CPT copyright 2016 American Medical Association. All rights reserved. The codes documented in this report are preliminary and upon coder review may  be revised to meet current compliance requirements. Lucilla Lame MD, MD 07/20/2016 9:10:44 AM This report has been signed  electronically. Number of Addenda: 0 Note Initiated On: 07/20/2016 8:54 AM Scope Withdrawal Time: 0 hours 6 minutes 45 seconds  Total Procedure Duration: 0 hours 10 minutes 38 seconds       [facility]

## 2016-07-20 NOTE — H&P (Signed)
Lucilla Lame, MD [facility] [address] [place], [place] [address] Phone: [phone] Fax : [phone]  Primary Care Physician:  [name], MD Primary Gastroenterologist:  Dr. Allen Norris  Pre-Procedure History & Physical: HPI:  Bruce Wang is a 51 y.o. male is here for a screening colonoscopy.   Past Medical History:  Diagnosis Date  . Dyspnea   . 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 Gout   . History of chicken pox     Past Surgical History:  Procedure Laterality Date  . INGUINAL HERNIA REPAIR  1990s  . ROUX-EN-Y PROCEDURE  2000    Prior to Admission medications   Medication Sig Start Date End Date Taking? Authorizing Provider  allopurinol (ZYLOPRIM) 300 MG tablet Take 0.5 tablets (150 mg total) by mouth daily. 05/30/16  Yes [name], MD  cetirizine (ZYRTEC) 10 MG tablet Take 10 mg by mouth daily.   Yes [provider]  Cholecalciferol (VITAMIN D) 2000 units CAPS Take 1 capsule by mouth daily.   Yes [provider]  fluticasone (FLONASE) 50 MCG/ACT nasal spray Place 2 sprays into both nostrils daily. 03/03/16  Yes Jearld Fenton, NP  tadalafil (CIALIS) 20 MG tablet Take 1 tablet (20 mg total) by mouth every other day as needed for erectile dysfunction. 05/30/16  Yes [name], MD  vitamin B-12 (CYANOCOBALAMIN) 1000 MCG tablet Take 1,000 mcg by mouth daily.   Yes [provider]  vitamin C (ASCORBIC ACID) 500 MG tablet Take 500 mg by mouth daily.   Yes [provider]    Allergies as of 06/22/2016  . (No Known Allergies)    Family History  Problem Relation Age of Onset  . Hypertension Mother   . Hypertension Sister   . Cancer Paternal Uncle 20       colon cancer, prostate cancer  . Dementia Maternal Grandmother   . Parkinsonism Maternal Grandfather   . Parkinson's disease Maternal Grandfather   . Macular degeneration Father   . Macular degeneration Paternal Grandmother   . Coronary  artery disease Neg Hx   . Stroke Neg Hx   . Diabetes Neg Hx     Social History   Social History  . Marital status: Divorced    Spouse name: N/A  . Number of children: N/A  . Years of education: N/A   Occupational History  . Not on file.   Social History Main Topics  . Smoking status: Former Smoker    Quit date: 01/09/2010  . Smokeless tobacco: Never Used  . Alcohol use 12.6 oz/week    21 Cans of beer per week     Comment: Regular on weekends  . Drug use: No  . Sexual activity: Yes     Comment: wife   Other Topics Concern  . Not on file   Social History Narrative   Caffeine: 1 pot coffee/day   Lives with wife, 2 children, 2 dogs   Occupation: Editor, commissioning for [employer]   Edu: 2 yrs college   Activity: walking 2 mi/day   Diet: good water, fruits/vegetables daily, red meat 2x/wk, never fish.     alternates between low carb and eating healthy.      Review of Systems: See HPI, otherwise negative ROS  Physical Exam: BP (!) 120/92   Pulse 67   Temp 97.7 F (36.5 C) (Temporal)   Ht 6' (1.829 m)   Wt 279 lb (126.6 kg)   SpO2  99%   BMI 37.84 kg/m  General:   Alert,  pleasant and cooperative in NAD Head:  Normocephalic and atraumatic. Neck:  Supple; no masses or thyromegaly. Lungs:  Clear throughout to auscultation.    Heart:  Regular rate and rhythm. Abdomen:  Soft, nontender and nondistended. Normal bowel sounds, without guarding, and without rebound.   Neurologic:  Alert and  oriented x4;  grossly normal neurologically.  Impression/Plan: SAMPSON SELF is now here to undergo a screening colonoscopy.  Risks, benefits, and alternatives regarding colonoscopy have been reviewed with the patient.  Questions have been answered.  All parties agreeable.

## 2016-07-20 NOTE — Anesthesia Postprocedure Evaluation (Signed)
Anesthesia Post Note  Patient: Bruce Wang  Procedure(s) Performed: Procedure(s) (LRB): COLONOSCOPY WITH PROPOFOL (N/A) POLYPECTOMY  Patient location during evaluation: PACU Anesthesia Type: General Level of consciousness: awake and alert and oriented Pain management: satisfactory to patient Vital Signs Assessment: post-procedure vital signs reviewed and stable Respiratory status: spontaneous breathing, nonlabored ventilation and respiratory function stable Cardiovascular status: blood pressure returned to baseline and stable Postop Assessment: Adequate PO intake and No signs of nausea or vomiting Anesthetic complications: no    Raliegh Ip

## 2016-07-20 NOTE — Transfer of Care (Signed)
Immediate Anesthesia Transfer of Care Note  Patient: Bruce Wang  Procedure(s) Performed: Procedure(s): COLONOSCOPY WITH PROPOFOL (N/A) POLYPECTOMY  Patient Location: PACU  Anesthesia Type: General  Level of Consciousness: awake, alert  and patient cooperative  Airway and Oxygen Therapy: Patient Spontanous Breathing and Patient connected to supplemental oxygen  Post-op Assessment: Post-op Vital signs reviewed, Patient's Cardiovascular Status Stable, Respiratory Function Stable, Patent Airway and No signs of Nausea or vomiting  Post-op Vital Signs: Reviewed and stable  Complications: No apparent anesthesia complications

## 2016-07-20 NOTE — Anesthesia Preprocedure Evaluation (Signed)
Anesthesia Evaluation  Patient identified by MRN, date of birth, ID band Patient awake    Reviewed: Allergy & Precautions, H&P , NPO status , Patient's Chart, lab work & pertinent test results  Airway Mallampati: II  TM Distance: >3 FB Neck ROM: full    Dental no notable dental hx.    Pulmonary former smoker,    Pulmonary exam normal breath sounds clear to auscultation       Cardiovascular Normal cardiovascular exam Rhythm:regular Rate:Normal     Neuro/Psych    GI/Hepatic   Endo/Other  Morbid obesity  Renal/GU      Musculoskeletal   Abdominal   Peds  Hematology   Anesthesia Other Findings   Reproductive/Obstetrics                             Anesthesia Physical Anesthesia Plan  ASA: II  Anesthesia Plan: General   Post-op Pain Management:    Induction:   PONV Risk Score and Plan: 2 and Propofol  Airway Management Planned:   Additional Equipment:   Intra-op Plan:   Post-operative Plan:   Informed Consent: I have reviewed the patients History and Physical, chart, labs and discussed the procedure including the risks, benefits and alternatives for the proposed anesthesia with the patient or authorized representative who has indicated his/her understanding and acceptance.     Plan Discussed with: CRNA  Anesthesia Plan Comments:         Anesthesia Quick Evaluation

## 2016-07-20 NOTE — Anesthesia Procedure Notes (Signed)
Procedure Name: MAC Date/Time: 07/20/2016 8:54 AM Performed by: Janna Arch Pre-anesthesia Checklist: Patient identified, Emergency Drugs available, Suction available and Patient being monitored Patient Re-evaluated:Patient Re-evaluated prior to induction Oxygen Delivery Method: Nasal cannula

## 2016-07-24 ENCOUNTER — Encounter: Payer: Self-pay | Admitting: Family Medicine

## 2016-07-24 NOTE — Telephone Encounter (Signed)
See mychart message - plz see if pt can come in tomorrow at 12:30pm thanks

## 2016-07-25 ENCOUNTER — Encounter: Payer: Self-pay | Admitting: Gastroenterology

## 2016-07-25 ENCOUNTER — Ambulatory Visit (INDEPENDENT_AMBULATORY_CARE_PROVIDER_SITE_OTHER): Payer: BLUE CROSS/BLUE SHIELD | Admitting: Family Medicine

## 2016-07-25 ENCOUNTER — Encounter: Payer: Self-pay | Admitting: Family Medicine

## 2016-07-25 VITALS — BP 126/70 | HR 96 | Temp 98.1°F | Wt 281.8 lb

## 2016-07-25 DIAGNOSIS — G8929 Other chronic pain: Secondary | ICD-10-CM | POA: Insufficient documentation

## 2016-07-25 DIAGNOSIS — M5416 Radiculopathy, lumbar region: Secondary | ICD-10-CM

## 2016-07-25 DIAGNOSIS — IMO0001 Reserved for inherently not codable concepts without codable children: Secondary | ICD-10-CM

## 2016-07-25 DIAGNOSIS — E669 Obesity, unspecified: Secondary | ICD-10-CM

## 2016-07-25 MED ORDER — PREDNISONE 20 MG PO TABS: ORAL_TABLET | ORAL | 0 refills | Status: DC

## 2016-07-25 MED ORDER — CYCLOBENZAPRINE HCL 10 MG PO TABS: 5.0000 mg | ORAL_TABLET | Freq: Two times a day (BID) | ORAL | 0 refills | Status: DC | PRN

## 2016-07-25 NOTE — Progress Notes (Signed)
BP 126/70   Pulse 96   Temp 98.1 F (36.7 C) (Oral)   Wt 281 lb 12 oz (127.8 kg)   SpO2 93%   BMI 38.21 kg/m    CC: LBP Subjective:    Patient ID: Bruce Wang, male    DOB: [birth year], 51 y.o.   MRN: [id number]  HPI: YOSIEL THIEME is a 51 y.o. male presenting on 07/25/2016 for Back Pain (denies any urinary s/s)   2-3 wk h/o lower back pain with radiculopathy which can go down either legs - pain down right leg at lateral ankle, numbness down entire left leg to toes. No weakness of legs, no fevers/chills, bowel/bladder accidents. Recent move has exacerbated back over the past week. Denies inciting trauma/injury.  Treating with ibuprofen 400mg  PRN which doesn't really help.   H/o back pain with MRI showing bulging disc L4/5 ~12/2013 improved with chiropractic management (Dr Butch Penny) and prednisone course. ESI with Dr Sharlet Salina was not beneficial.   Relevant past medical, surgical, family and social history reviewed and updated as indicated. Interim medical history since our last visit reviewed. Allergies and medications reviewed and updated. Outpatient Medications Prior to Visit  Medication Sig Dispense Refill  . allopurinol (ZYLOPRIM) 300 MG tablet Take 0.5 tablets (150 mg total) by mouth daily.  0  . cetirizine (ZYRTEC) 10 MG tablet Take 10 mg by mouth daily.    . Cholecalciferol (VITAMIN D) 2000 units CAPS Take 1 capsule by mouth daily.    . fluticasone (FLONASE) 50 MCG/ACT nasal spray Place 2 sprays into both nostrils daily. 16 g 6  . tadalafil (CIALIS) 20 MG tablet Take 1 tablet (20 mg total) by mouth every other day as needed for erectile dysfunction. 5 tablet 3  . vitamin B-12 (CYANOCOBALAMIN) 1000 MCG tablet Take 1,000 mcg by mouth daily.    . vitamin C (ASCORBIC ACID) 500 MG tablet Take 500 mg by mouth daily.     No facility-administered medications prior to visit.      Per HPI unless specifically indicated in ROS section below Review of Systems     Objective:      BP 126/70   Pulse 96   Temp 98.1 F (36.7 C) (Oral)   Wt 281 lb 12 oz (127.8 kg)   SpO2 93%   BMI 38.21 kg/m   Wt Readings from Last 3 Encounters:  07/25/16 281 lb 12 oz (127.8 kg)  07/20/16 279 lb (126.6 kg)  05/30/16 273 lb 8 oz (124.1 kg)    Physical Exam  Constitutional: He is oriented to person, place, and time. He appears well-developed and well-nourished. No distress.  Musculoskeletal:  Tenderness to palpation mid lumbar midline spine No paraspinous mm tenderness Neg SLR bilaterally. No pain with int/ext rotation at hip. Mild discomfort at L SIJ with L sided FABER. No pain at SIJ, GTB or sciatic notch bilaterally.  Neurological: He is alert and oriented to person, place, and time. He has normal strength. No sensory deficit.  Reflex Scores:      Patellar reflexes are 2+ on the right side and 2+ on the left side. Able to heel and toe walk without weakness 5/5 strength BLE  Nursing note and vitals reviewed.      Assessment & Plan:   Problem List Items Addressed This Visit    Chronic radicular pain of lower back - Primary    Acute flare of what sounds like chronic degenerative disc disease in h/o bulging discs by prior MRI.  Prior ESI ineffective. No red flags today. Will treat with prednisone course as well as flexeril muscle relaxant. Discussed lower back exercises, importance of regular walking routine and goal weight loss.  Update if not improving with treatment. Red flags to seek further care reviewed.       Relevant Medications   cyclobenzaprine (FLEXERIL) 10 MG tablet   Obesity, Class II, BMI 35-39.9, with comorbidity       Follow up plan: Return if symptoms worsen or fail to improve.  [name], MD

## 2016-07-25 NOTE — Patient Instructions (Signed)
For flare of lower back pain - treat with prednisone course and flexeril muscle relaxant.  Stretching exercises. Walking routine.  No heavy lifting if we can avoid >20 lbs.  Let us know if any worsening, or weakness of legs

## 2016-07-25 NOTE — Assessment & Plan Note (Signed)
Acute flare of what sounds like chronic degenerative disc disease in h/o bulging discs by prior MRI. Prior ESI ineffective. No red flags today. Will treat with prednisone course as well as flexeril muscle relaxant. Discussed lower back exercises, importance of regular walking routine and goal weight loss.  Update if not improving with treatment. Red flags to seek further care reviewed.

## 2016-07-27 ENCOUNTER — Encounter: Payer: Self-pay | Admitting: Gastroenterology

## 2016-08-06 ENCOUNTER — Other Ambulatory Visit: Payer: Self-pay | Admitting: Family Medicine

## 2016-08-12 ENCOUNTER — Other Ambulatory Visit: Payer: Self-pay | Admitting: Family Medicine

## 2016-08-12 DIAGNOSIS — J302 Other seasonal allergic rhinitis: Secondary | ICD-10-CM

## 2016-08-14 NOTE — Telephone Encounter (Signed)
At the May 2018 ov, it was discussed that he would decrease the allopurinol to 150mg  daily. I am assuming he is breaking the 300mg  in half. Are we to keep the quantity #90 or decrease to #45?

## 2016-08-17 DIAGNOSIS — M9903 Segmental and somatic dysfunction of lumbar region: Secondary | ICD-10-CM | POA: Diagnosis not present

## 2016-08-17 DIAGNOSIS — M5136 Other intervertebral disc degeneration, lumbar region: Secondary | ICD-10-CM | POA: Diagnosis not present

## 2016-08-17 DIAGNOSIS — M5134 Other intervertebral disc degeneration, thoracic region: Secondary | ICD-10-CM | POA: Diagnosis not present

## 2016-08-17 DIAGNOSIS — M9902 Segmental and somatic dysfunction of thoracic region: Secondary | ICD-10-CM | POA: Diagnosis not present

## 2016-08-22 ENCOUNTER — Encounter: Payer: Self-pay | Admitting: Family Medicine

## 2016-08-22 ENCOUNTER — Ambulatory Visit (INDEPENDENT_AMBULATORY_CARE_PROVIDER_SITE_OTHER): Payer: BLUE CROSS/BLUE SHIELD | Admitting: Family Medicine

## 2016-08-22 VITALS — BP 136/74 | HR 73 | Temp 98.3°F | Wt 281.0 lb

## 2016-08-22 DIAGNOSIS — R21 Rash and other nonspecific skin eruption: Secondary | ICD-10-CM | POA: Insufficient documentation

## 2016-08-22 DIAGNOSIS — G8929 Other chronic pain: Secondary | ICD-10-CM

## 2016-08-22 DIAGNOSIS — M5416 Radiculopathy, lumbar region: Secondary | ICD-10-CM | POA: Diagnosis not present

## 2016-08-22 MED ORDER — SULFAMETHOXAZOLE-TRIMETHOPRIM 800-160 MG PO TABS: 1.0000 | ORAL_TABLET | Freq: Two times a day (BID) | ORAL | 0 refills | Status: DC

## 2016-08-22 NOTE — Assessment & Plan Note (Signed)
Known h/o lumbar disc protrusion resulting in left L4 nerve root impingement by MRI 2015. Ongoing flare since last month, acute worsening after recent move. Did not respond to ESI/PT course 2015, did not respond to muscle relaxant or prednisone course last month. He is currently being managed by chiropractor who obtained xrays concerning for marked thoracic kyphosis. Will refer to ortho per pt request for further evaluation.

## 2016-08-22 NOTE — Assessment & Plan Note (Addendum)
Rash around new tattoo - anticipate mix of heat rash after ceran wrap and possible foreign body reaction to tattoo ink resulting in milia and papules. No current signs of bacterial superinfection. Recommended continued moisturizing once daily as well as warm compresses. Rx for bactrim provided in case spreading erythema.

## 2016-08-22 NOTE — Patient Instructions (Signed)
For tattoo - continue moisturizing cream once daily and let us know if not improving. Prescription for antibiotic printed out today - fill if worsening rash with redness develops.  For back - we will refer you to orthopedist for evaluation.

## 2016-08-22 NOTE — Progress Notes (Signed)
BP 136/74   Pulse 73   Temp 98.3 F (36.8 C) (Oral)   Wt 281 lb (127.5 kg)   SpO2 98%   BMI 38.11 kg/m    CC: check tattoo Subjective:    Patient ID: Bruce Wang, male    DOB: 1965/03/28, 51 y.o.   MRN: [id number]  HPI: Bruce Wang is a 51 y.o. male presenting on 08/22/2016 for infected tattoo   Extensive tattoo R arm, working on this over last several months, finished on Tuesday. Ceran wrap placed over arm at that time. Yesterday noticed new rash at new tattoo site breaking out. Rash is itchy.  No prior reaction to tattoos in the past.  No fevers/chills, nausea.  No other skin rashes  Back pain - acute flare after moving, has started seeing chiropractor. Currently treating with ibuprofen. Seen last month with acute flare of chronic lower back pain and we treated with prednisone/flexeril without significant improvement. Planned adjustments 3x/wk with chiropractor. He would like ortho referral for significant spine curvature.   Known left disc protrusion at L4 with nerve root impingement by MR 2015. He has previously had ESI x2 by Dr Sharlet Salina without significant improvement. He also completed physical therapy 2015.   Relevant past medical, surgical, family and social history reviewed and updated as indicated. Interim medical history since our last visit reviewed. Allergies and medications reviewed and updated. Outpatient Medications Prior to Visit  Medication Sig Dispense Refill  . allopurinol (ZYLOPRIM) 300 MG tablet TAKE 1 TABLET BY MOUTH DAILY 90 tablet 0  . cetirizine (ZYRTEC) 10 MG tablet Take 10 mg by mouth daily.    . Cholecalciferol (VITAMIN D) 2000 units CAPS Take 1 capsule by mouth daily.    . fluticasone (FLONASE) 50 MCG/ACT nasal spray Place 2 sprays into both nostrils daily. 16 g 6  . tadalafil (CIALIS) 20 MG tablet Take 1 tablet (20 mg total) by mouth every other day as needed for erectile dysfunction. 5 tablet 3  . vitamin B-12 (CYANOCOBALAMIN) 1000 MCG tablet Take  1,000 mcg by mouth daily.    . vitamin C (ASCORBIC ACID) 500 MG tablet Take 500 mg by mouth daily.    Marland Kitchen allopurinol (ZYLOPRIM) 300 MG tablet Take 0.5 tablets (150 mg total) by mouth daily.  0  . cyclobenzaprine (FLEXERIL) 10 MG tablet TAKE 1/2 TO 1 TABLET BY MOUTH TWICE A DAY AS NEEDED FOR MUSCLE SPASM 30 tablet 0  . predniSONE (DELTASONE) 20 MG tablet Take two tablets daily for 3 days followed by one tablet daily for 4 days 10 tablet 0   No facility-administered medications prior to visit.      Per HPI unless specifically indicated in ROS section below Review of Systems     Objective:    BP 136/74   Pulse 73   Temp 98.3 F (36.8 C) (Oral)   Wt 281 lb (127.5 kg)   SpO2 98%   BMI 38.11 kg/m   Wt Readings from Last 3 Encounters:  08/22/16 281 lb (127.5 kg)  07/25/16 281 lb 12 oz (127.8 kg)  07/20/16 279 lb (126.6 kg)    Physical Exam  Constitutional: He appears well-developed and well-nourished. No distress.  Musculoskeletal: He exhibits no edema.  No significant thoracic scoliosis appreciated. Thoracic kyphosis. Mild discomfort midline lumbar spine No paraspinous mm tenderness  Skin: Skin is warm and dry. Rash noted. No erythema.  Rash isolated to R arm - papules and milia, some with mild erythema, at site of new  arm tattoo  Nursing note and vitals reviewed.     Assessment & Plan:   Problem List Items Addressed This Visit    Chronic radicular pain of lower back    Known h/o lumbar disc protrusion resulting in left L4 nerve root impingement by MRI 2015. Ongoing flare since last month, acute worsening after recent move. Did not respond to ESI/PT course 2015, did not respond to muscle relaxant or prednisone course last month. He is currently being managed by chiropractor who obtained xrays concerning for marked thoracic kyphosis. Will refer to ortho per pt request for further evaluation.       Relevant Orders   Ambulatory referral to Orthopedic Surgery   Rash/skin  eruption - Primary    Rash around new tattoo - anticipate mix of heat rash after ceran wrap and possible foreign body reaction to tattoo ink resulting in milia and papules. No current signs of bacterial superinfection. Recommended continued moisturizing once daily as well as warm compresses. Rx for bactrim provided in case spreading erythema.           Follow up plan: Return if symptoms worsen or fail to improve.  [name], MD

## 2016-08-23 ENCOUNTER — Other Ambulatory Visit: Payer: Self-pay | Admitting: Family Medicine

## 2016-09-03 ENCOUNTER — Encounter: Payer: Self-pay | Admitting: Family Medicine

## 2016-09-03 MED ORDER — TADALAFIL 20 MG PO TABS: 20.0000 mg | ORAL_TABLET | ORAL | 11 refills | Status: DC | PRN

## 2016-09-04 DIAGNOSIS — M25561 Pain in right knee: Secondary | ICD-10-CM | POA: Diagnosis not present

## 2016-09-04 DIAGNOSIS — M546 Pain in thoracic spine: Secondary | ICD-10-CM | POA: Diagnosis not present

## 2016-09-04 DIAGNOSIS — M545 Low back pain: Secondary | ICD-10-CM | POA: Diagnosis not present

## 2016-10-19 ENCOUNTER — Other Ambulatory Visit: Payer: Self-pay | Admitting: Internal Medicine

## 2016-10-19 DIAGNOSIS — J302 Other seasonal allergic rhinitis: Secondary | ICD-10-CM

## 2016-10-20 ENCOUNTER — Other Ambulatory Visit: Payer: Self-pay | Admitting: Internal Medicine

## 2016-10-20 DIAGNOSIS — J302 Other seasonal allergic rhinitis: Secondary | ICD-10-CM

## 2017-01-22 ENCOUNTER — Encounter: Payer: Self-pay | Admitting: Family Medicine

## 2017-01-23 ENCOUNTER — Encounter: Payer: Self-pay | Admitting: Family Medicine

## 2017-01-26 ENCOUNTER — Ambulatory Visit: Payer: BLUE CROSS/BLUE SHIELD | Admitting: Family Medicine

## 2017-01-26 ENCOUNTER — Encounter: Payer: Self-pay | Admitting: Family Medicine

## 2017-01-26 VITALS — BP 118/80 | HR 94 | Temp 97.9°F | Wt 294.0 lb

## 2017-01-26 DIAGNOSIS — E559 Vitamin D deficiency, unspecified: Secondary | ICD-10-CM

## 2017-01-26 DIAGNOSIS — Z9884 Bariatric surgery status: Secondary | ICD-10-CM | POA: Diagnosis not present

## 2017-01-26 DIAGNOSIS — E538 Deficiency of other specified B group vitamins: Secondary | ICD-10-CM | POA: Diagnosis not present

## 2017-01-26 DIAGNOSIS — R109 Unspecified abdominal pain: Secondary | ICD-10-CM | POA: Diagnosis not present

## 2017-01-26 DIAGNOSIS — E611 Iron deficiency: Secondary | ICD-10-CM | POA: Diagnosis not present

## 2017-01-26 DIAGNOSIS — D509 Iron deficiency anemia, unspecified: Secondary | ICD-10-CM | POA: Insufficient documentation

## 2017-01-26 LAB — COMPREHENSIVE METABOLIC PANEL
ALT: 21 U/L (ref 0–53)
AST: 28 U/L (ref 0–37)
Albumin: 4.1 g/dL (ref 3.5–5.2)
Alkaline Phosphatase: 62 U/L (ref 39–117)
BUN: 13 mg/dL (ref 6–23)
CALCIUM: 9.2 mg/dL (ref 8.4–10.5)
CHLORIDE: 106 meq/L (ref 96–112)
CO2: 26 meq/L (ref 19–32)
Creatinine, Ser: 0.9 mg/dL (ref 0.40–1.50)
GFR: 94.77 mL/min (ref >60.00)
GLUCOSE: 88 mg/dL (ref 70–99)
Potassium: 4.1 mEq/L (ref 3.5–5.1)
Sodium: 141 mEq/L (ref 135–145)
Total Bilirubin: 0.5 mg/dL (ref 0.2–1.2)
Total Protein: 6.4 g/dL (ref 6.0–8.3)

## 2017-01-26 LAB — VITAMIN D 25 HYDROXY (VIT D DEFICIENCY, FRACTURES): VITD: 31.99 ng/mL (ref 30.00–100.00)

## 2017-01-26 LAB — CBC WITH DIFFERENTIAL/PLATELET
BASOS PCT: 0.8 % (ref 0.0–3.0)
Basophils Absolute: 0 10*3/uL (ref 0.0–0.1)
Eosinophils Absolute: 0.4 10*3/uL (ref 0.0–0.7)
Eosinophils Relative: 10.7 % — ABNORMAL HIGH (ref 0.0–5.0)
HEMATOCRIT: 42.1 % (ref 39.0–52.0)
Hemoglobin: 13.8 g/dL (ref 13.0–17.0)
LYMPHS ABS: 1.5 10*3/uL (ref 0.7–4.0)
LYMPHS PCT: 41.1 % (ref 12.0–46.0)
MCHC: 32.9 g/dL (ref 30.0–36.0)
MCV: 92.3 fl (ref 78.0–100.0)
MONOS PCT: 7.1 % (ref 3.0–12.0)
Monocytes Absolute: 0.3 10*3/uL (ref 0.1–1.0)
NEUTROS ABS: 1.4 10*3/uL (ref 1.4–7.7)
NEUTROS PCT: 40.3 % — AB (ref 43.0–77.0)
PLATELETS: 246 10*3/uL (ref 150.0–400.0)
RBC: 4.56 Mil/uL (ref 4.22–5.81)
RDW: 14.4 % (ref 11.5–15.5)
WBC: 3.6 10*3/uL — ABNORMAL LOW (ref 4.0–10.5)

## 2017-01-26 LAB — LIPASE: Lipase: 21 U/L (ref 11.0–59.0)

## 2017-01-26 LAB — VITAMIN B12: Vitamin B-12: >1500 pg/mL — ABNORMAL HIGH (ref 211–911)

## 2017-01-26 LAB — FERRITIN: Ferritin: 21.9 ng/mL — ABNORMAL LOW (ref 22.0–322.0)

## 2017-01-26 MED ORDER — OMEPRAZOLE 40 MG PO CPDR: 40.0000 mg | DELAYED_RELEASE_CAPSULE | Freq: Every day | ORAL | 3 refills | Status: DC

## 2017-01-26 NOTE — Assessment & Plan Note (Signed)
H/o vitamin deficiencies - update labs.

## 2017-01-26 NOTE — Progress Notes (Signed)
BP 118/80 (BP Location: Left Arm, Patient Position: Sitting, Cuff Size: Large)   Pulse 94   Temp 97.9 F (36.6 C) (Oral)   Wt 294 lb (133.4 kg)   SpO2 94%   BMI 39.87 kg/m    CC: GI trouble Subjective:    Patient ID: Bruce Wang, male    DOB: 02-20-1965, 52 y.o.   MRN: [id number]  HPI: Bruce Wang is a 52 y.o. male presenting on 01/26/2017 for GI Problem (C/o excessive gas and severe bloating. Started 6-8 wks ago.  Has sxs all day.  Tried Gas-X and Beano, no help. Also, feels a frim band across mid abd. H/o gastric bypass)   8 wk h/o constant gas pain and indigestion, bloating pain and solid pressure discomfort across band of upper abdomen. No aggravating or alleviating factor. Some early satiety. Not worse with fatty meal.   No improvement with beano, gas X, not food related.  No fevers/chills, diarrhea, constipation, blood in stool, nausea/vomiting, dysphagia. No cramping abd pain  Weight gain noted. He has started weight watchers this month - has lost 6 lbs in last few weeks.  On daily duexis for knee pain - long term medication.   H/o incisional hernia s/p gen surgery eval 2009 and repair ([place] [facility]). EGD at that time - no records available.   H/o roux en y gastric bypass 2000 [facility]). Peak 400 lbs, nadir 220lbs.  COLONOSCOPY WITH PROPOFOL 07/20/2016 1 TA, rpt 5 yrs Allen Norris, Darren, MD)  Relevant past medical, surgical, family and social history reviewed and updated as indicated. Interim medical history since our last visit reviewed. Allergies and medications reviewed and updated. Outpatient Medications Prior to Visit  Medication Sig Dispense Refill  . allopurinol (ZYLOPRIM) 300 MG tablet TAKE 1 TABLET BY MOUTH EVERY DAY 90 tablet 0  . cetirizine (ZYRTEC) 10 MG tablet Take 10 mg by mouth daily.    . Cholecalciferol (VITAMIN D) 2000 units CAPS Take 1 capsule by mouth daily.    . DUEXIS 800-26.6 MG TABS Take 1 tablet by mouth 2 (two) times daily as needed.   3  . fluticasone (FLONASE) 50 MCG/ACT nasal spray Place 2 sprays into both nostrils daily. 16 g 6  . tadalafil (CIALIS) 20 MG tablet Take 1 tablet (20 mg total) by mouth every other day as needed for erectile dysfunction. 5 tablet 11  . vitamin B-12 (CYANOCOBALAMIN) 1000 MCG tablet Take 1,000 mcg by mouth daily.    . vitamin C (ASCORBIC ACID) 500 MG tablet Take 500 mg by mouth daily.    Marland Kitchen sulfamethoxazole-trimethoprim (BACTRIM DS,SEPTRA DS) 800-160 MG tablet Take 1 tablet by mouth 2 (two) times daily. 10 tablet 0   No facility-administered medications prior to visit.      Per HPI unless specifically indicated in ROS section below Review of Systems     Objective:    BP 118/80 (BP Location: Left Arm, Patient Position: Sitting, Cuff Size: Large)   Pulse 94   Temp 97.9 F (36.6 C) (Oral)   Wt 294 lb (133.4 kg)   SpO2 94%   BMI 39.87 kg/m   Wt Readings from Last 3 Encounters:  01/26/17 294 lb (133.4 kg)  08/22/16 281 lb (127.5 kg)  07/25/16 281 lb 12 oz (127.8 kg)    Physical Exam  Constitutional: He appears well-developed and well-nourished. No distress.  HENT:  Mouth/Throat: Oropharynx is clear and moist. No oropharyngeal exudate.  Cardiovascular: Normal rate, regular rhythm, normal heart sounds and  intact distal pulses.  No murmur heard. Pulmonary/Chest: Effort normal and breath sounds normal. No respiratory distress. He has no wheezes. He has no rales.  Abdominal: Soft. Normal appearance and bowel sounds are normal. He exhibits no distension and no mass. There is no hepatosplenomegaly. There is tenderness (mild) in the epigastric area. There is no rigidity, no rebound, no guarding, no CVA tenderness and negative Murphy's sign.  Musculoskeletal: He exhibits no edema.  Skin: Skin is warm and dry. No rash noted.  Psychiatric: He has a normal mood and affect.  Nursing note and vitals reviewed.  Results for orders placed or performed in visit on 05/23/16  Lipid panel  Result  Value Ref Range   Cholesterol 147 0 - 200 mg/dL   Triglycerides 134.0 0.0 - 149.0 mg/dL   HDL 63.90 >39.00 mg/dL   VLDL 26.8 0.0 - 40.0 mg/dL   LDL Cholesterol 57 0 - 99 mg/dL   Total CHOL/HDL Ratio 2    NonHDL 83.53   Comprehensive metabolic panel  Result Value Ref Range   Sodium 142 135 - 145 mEq/L   Potassium 3.7 3.5 - 5.1 mEq/L   Chloride 109 96 - 112 mEq/L   CO2 26 19 - 32 mEq/L   Glucose, Bld 88 70 - 99 mg/dL   BUN 12 6 - 23 mg/dL   Creatinine, Ser 0.94 0.40 - 1.50 mg/dL   Total Bilirubin 0.4 0.2 - 1.2 mg/dL   Alkaline Phosphatase 80 39 - 117 U/L   AST 26 0 - 37 U/L   ALT 25 0 - 53 U/L   Total Protein 6.5 6.0 - 8.3 g/dL   Albumin 4.1 3.5 - 5.2 g/dL   Calcium 9.1 8.4 - 10.5 mg/dL   GFR 90.38 >60.00 mL/min  CBC with Differential/Platelet  Result Value Ref Range   WBC 3.8 (L) 4.0 - 10.5 K/uL   RBC 4.51 4.22 - 5.81 Mil/uL   Hemoglobin 13.3 13.0 - 17.0 g/dL   HCT 40.0 39.0 - 52.0 %   MCV 88.7 78.0 - 100.0 fl   MCHC 33.3 30.0 - 36.0 g/dL   RDW 14.8 11.5 - 15.5 %   Platelets 245.0 150.0 - 400.0 K/uL   Neutrophils Relative % 39.0 (L) 43.0 - 77.0 %   Lymphocytes Relative 45.2 12.0 - 46.0 %   Monocytes Relative 7.3 3.0 - 12.0 %   Eosinophils Relative 7.7 (H) 0.0 - 5.0 %   Basophils Relative 0.8 0.0 - 3.0 %   Neutro Abs 1.5 1.4 - 7.7 K/uL   Lymphs Abs 1.7 0.7 - 4.0 K/uL   Monocytes Absolute 0.3 0.1 - 1.0 K/uL   Eosinophils Absolute 0.3 0.0 - 0.7 K/uL   Basophils Absolute 0.0 0.0 - 0.1 K/uL  Uric acid  Result Value Ref Range   Uric Acid, Serum 4.7 4.0 - 7.8 mg/dL  Vitamin B12  Result Value Ref Range   Vitamin B-12 172 (L) 211 - 911 pg/mL  VITAMIN D 25 Hydroxy (Vit-D Deficiency, Fractures)  Result Value Ref Range   VITD 18.17 (L) 30.00 - 100.00 ng/mL  Ferritin  Result Value Ref Range   Ferritin 15.7 (L) 22.0 - 322.0 ng/mL  TSH  Result Value Ref Range   TSH 1.59 0.35 - 4.50 uIU/mL      Assessment & Plan:   Problem List Items Addressed This Visit    Abdominal  discomfort - Primary    ?GERD related - stop duexis, start omeprazole 40mg  daily. rec avoid gas producing foods.  Not consistent with IBS.  Check labs today. Check abd [place] to eval for gallstones. Refer back to gen surgery in bariatric surgery status.  Pt agrees with plan.       Relevant Orders   Ambulatory referral to General Surgery   US Abdomen Complete   Comprehensive metabolic panel   CBC with Differential/Platelet   Lipase   Gastric bypass status for obesity    H/o vitamin deficiencies - update labs.       Relevant Orders   Ambulatory referral to General Surgery   US Abdomen Complete   Ferritin   Iron deficiency   Vitamin B12 deficiency   Relevant Orders   Vitamin B12   Vitamin D deficiency   Relevant Orders   VITAMIN D 25 Hydroxy (Vit-D Deficiency, Fractures)       Follow up plan: Return if symptoms worsen or fail to improve.  [name], MD

## 2017-01-26 NOTE — Assessment & Plan Note (Addendum)
?  GERD related - stop duexis, start omeprazole 40mg  daily. rec avoid gas producing foods. Not consistent with IBS.  Check labs today. Check abd [place] to eval for gallstones. Refer back to gen surgery in bariatric surgery status.  Pt agrees with plan.

## 2017-01-26 NOTE — Patient Instructions (Addendum)
Stop duexis, take tylenol instead. Start omeprazole 40mg  daily for possible GERD contribution. We will check labs and abdominal ultrasound. We will refer you back to gen surgery. Exclude gas producing foods (beans, onions, celery, carrots, raisins, bananas, apricots, prunes, brussel sprouts, wheat germ, pretzels) See Rosaria Ferries for referral.

## 2017-01-27 ENCOUNTER — Other Ambulatory Visit: Payer: Self-pay | Admitting: Family Medicine

## 2017-01-27 MED ORDER — FERROUS SULFATE 325 (65 FE) MG PO TABS: 325.0000 mg | ORAL_TABLET | Freq: Every day | ORAL | 3 refills | Status: DC

## 2017-01-27 MED ORDER — VITAMIN B-12 500 MCG PO TABS: 500.0000 ug | ORAL_TABLET | Freq: Every day | ORAL | Status: DC

## 2017-01-29 ENCOUNTER — Other Ambulatory Visit: Payer: Self-pay | Admitting: Family Medicine

## 2017-01-29 DIAGNOSIS — J302 Other seasonal allergic rhinitis: Secondary | ICD-10-CM

## 2017-02-01 ENCOUNTER — Ambulatory Visit
Admission: RE | Admit: 2017-02-01 | Discharge: 2017-02-01 | Disposition: A | Discharge status: Home / Self Care | Payer: BLUE CROSS/BLUE SHIELD | Source: Ambulatory Visit | Attending: Family Medicine | Admitting: Family Medicine

## 2017-02-01 DIAGNOSIS — R109 Unspecified abdominal pain: Secondary | ICD-10-CM | POA: Diagnosis not present

## 2017-02-01 DIAGNOSIS — K8020 Calculus of gallbladder without cholecystitis without obstruction: Secondary | ICD-10-CM | POA: Insufficient documentation

## 2017-02-01 DIAGNOSIS — K760 Fatty (change of) liver, not elsewhere classified: Secondary | ICD-10-CM | POA: Insufficient documentation

## 2017-02-01 DIAGNOSIS — Z9884 Bariatric surgery status: Secondary | ICD-10-CM | POA: Diagnosis not present

## 2017-02-01 DIAGNOSIS — R140 Abdominal distension (gaseous): Secondary | ICD-10-CM | POA: Diagnosis present

## 2017-02-03 ENCOUNTER — Encounter: Payer: Self-pay | Admitting: Family Medicine

## 2017-02-03 DIAGNOSIS — K760 Fatty (change of) liver, not elsewhere classified: Secondary | ICD-10-CM | POA: Insufficient documentation

## 2017-02-13 DIAGNOSIS — Z9884 Bariatric surgery status: Secondary | ICD-10-CM | POA: Diagnosis not present

## 2017-02-13 DIAGNOSIS — K8020 Calculus of gallbladder without cholecystitis without obstruction: Secondary | ICD-10-CM | POA: Diagnosis not present

## 2017-02-15 ENCOUNTER — Encounter: Payer: Self-pay | Admitting: Family Medicine

## 2017-02-15 DIAGNOSIS — R109 Unspecified abdominal pain: Secondary | ICD-10-CM

## 2017-02-15 DIAGNOSIS — Z9884 Bariatric surgery status: Secondary | ICD-10-CM

## 2017-02-15 DIAGNOSIS — K760 Fatty (change of) liver, not elsewhere classified: Secondary | ICD-10-CM

## 2017-03-06 ENCOUNTER — Encounter: Payer: Self-pay | Admitting: *Deleted

## 2017-03-06 NOTE — Telephone Encounter (Signed)
Pt never returned call to schedule GI appt.

## 2017-04-18 ENCOUNTER — Other Ambulatory Visit: Payer: Self-pay | Admitting: Internal Medicine

## 2017-04-25 ENCOUNTER — Other Ambulatory Visit: Payer: Self-pay | Admitting: Family Medicine

## 2017-05-09 ENCOUNTER — Ambulatory Visit: Payer: BLUE CROSS/BLUE SHIELD | Admitting: Family Medicine

## 2017-05-09 ENCOUNTER — Encounter: Payer: Self-pay | Admitting: Family Medicine

## 2017-05-09 VITALS — BP 120/82 | HR 113 | Temp 98.3°F | Ht 72.0 in | Wt 269.0 lb

## 2017-05-09 DIAGNOSIS — M79671 Pain in right foot: Secondary | ICD-10-CM | POA: Diagnosis not present

## 2017-05-09 MED ORDER — NAPROXEN 500 MG PO TABS: ORAL_TABLET | ORAL | 0 refills | Status: DC

## 2017-05-09 NOTE — Assessment & Plan Note (Signed)
Plantar fascial tear given mechanism of injury vs plantar fasciitis. Supportive care reviewed. Rx naprosyn, discussed frozen water bottle massage, stretching exercises provided from [facility] pt advisor. If not improving with above, will refer to podiatry. Pt agrees with plan.

## 2017-05-09 NOTE — Patient Instructions (Signed)
You have either torn plantar fascia of right heel or have developed plantar fasciitis.  Treat with anti inflammatory, frozen water bottle massage, gentle stretching with towel, rest, and buy gel insert cushion for heel to place in shoe.  If not improving over next several weeks or any worsening, let us know for podiatry referral.

## 2017-05-09 NOTE — Progress Notes (Signed)
BP 120/82 (BP Location: Left Arm, Patient Position: Sitting, Cuff Size: Large)   Pulse (!) 113   Temp 98.3 F (36.8 C) (Oral)   Ht 6' (1.829 m)   Wt 269 lb (122 kg)   SpO2 97%   BMI 36.48 kg/m    CC: foot pain Subjective:    Patient ID: Bruce Wang, male    DOB: [birth year], 52 y.o.   MRN: [id number]  HPI: Bruce Wang is a 52 y.o. male presenting on 05/09/2017 for Foot Pain (C/o pain in bottom of right heel. Started on 05/05/17 after elevating foot and felt like a rubber band popped. Tried ibuprofen,  not helpful. )   R heel pain started after [place], heard pop and felt snap at R heel. Sudden pain, since with intermittent discomfort worse with first few steps after resting. First few steps in the morning are especially bad.  Supportive shoes feet better.  No achilles pain, heel swelling redness or warmth  No fevers.  Using flip flops.   Relevant past medical, surgical, family and social history reviewed and updated as indicated. Interim medical history since our last visit reviewed. Allergies and medications reviewed and updated. Outpatient Medications Prior to Visit  Medication Sig Dispense Refill  . allopurinol (ZYLOPRIM) 300 MG tablet TAKE 1 TABLET BY MOUTH DAILY 90 tablet 1  . cetirizine (ZYRTEC) 10 MG tablet Take 10 mg by mouth daily.    . Cholecalciferol (VITAMIN D) 2000 units CAPS Take 1 capsule by mouth daily.    . ferrous sulfate 325 (65 FE) MG tablet Take 1 tablet (325 mg total) by mouth daily with breakfast.  3  . fluticasone (FLONASE) 50 MCG/ACT nasal spray USE 2 SPRAYS IN BOTH NOSTRILS DAILY 16 g 0  . omeprazole (PRILOSEC) 40 MG capsule TAKE 1 CAPSULE (40 MG TOTAL) BY MOUTH DAILY. DAILY FOR 3 WEEKS THEN AS NEEDED FOR REFLUX SYMPTOMS 30 capsule 2  . tadalafil (CIALIS) 20 MG tablet Take 1 tablet (20 mg total) by mouth every other day as needed for erectile dysfunction. 5 tablet 11  . vitamin C (ASCORBIC  ACID) 500 MG tablet Take 500 mg by mouth daily.    Marland Kitchen allopurinol (ZYLOPRIM) 300 MG tablet TAKE 1 TABLET BY MOUTH EVERY DAY 90 tablet 0  . fluticasone (FLONASE) 50 MCG/ACT nasal spray Place 2 sprays into both nostrils daily. 16 g 6  . DUEXIS 800-26.6 MG TABS Take 1 tablet by mouth 2 (two) times daily as needed.  3  . vitamin B-12 (CYANOCOBALAMIN) 500 MCG tablet Take 1 tablet (500 mcg total) by mouth daily.     No facility-administered medications prior to visit.      Per HPI unless specifically indicated in ROS section below Review of Systems     Objective:    BP 120/82 (BP Location: Left Arm, Patient Position: Sitting, Cuff Size: Large)   Pulse (!) 113   Temp 98.3 F (36.8 C) (Oral)   Ht 6' (1.829 m)   Wt 269 lb (122 kg)   SpO2 97%   BMI 36.48 kg/m   Wt Readings from Last 3 Encounters:  05/09/17 269 lb (122 kg)  01/26/17 294 lb (133.4 kg)  08/22/16 281 lb (127.5 kg)    Physical Exam  Constitutional: He appears well-developed and well-nourished. No distress.  Musculoskeletal: Normal range of motion. He exhibits no edema.  2+ DP bilaterally No ligament laxity.  No achilles tendon  pain No significant pain with calcaneal squeeze Tender to palpation at proximal plantar fascia, point tender at mid heel  Skin: Skin is warm. No rash noted. No erythema.  Nursing note and vitals reviewed.      Assessment & Plan:   Problem List Items Addressed This Visit    Pain of right heel - Primary    Plantar fascial tear given mechanism of injury vs plantar fasciitis. Supportive care reviewed. Rx naprosyn, discussed frozen water bottle massage, stretching exercises provided from [facility] pt advisor. If not improving with above, will refer to podiatry. Pt agrees with plan.           Meds ordered this encounter  Medications  . naproxen (NAPROSYN) 500 MG tablet    Sig: Take one po bid x 1 week then prn pain, take with food    Dispense:  40 tablet    Refill:  0   No orders of the defined  types were placed in this encounter.   Follow up plan: Return if symptoms worsen or fail to improve.  Bruce Bush, MD

## 2017-05-15 ENCOUNTER — Other Ambulatory Visit: Payer: Self-pay | Admitting: Family Medicine

## 2017-05-23 DIAGNOSIS — H524 Presbyopia: Secondary | ICD-10-CM | POA: Diagnosis not present

## 2017-05-23 DIAGNOSIS — H52223 Regular astigmatism, bilateral: Secondary | ICD-10-CM | POA: Diagnosis not present

## 2017-05-23 DIAGNOSIS — H5211 Myopia, right eye: Secondary | ICD-10-CM | POA: Diagnosis not present

## 2017-05-25 ENCOUNTER — Telehealth: Payer: Self-pay | Admitting: *Deleted

## 2017-05-25 DIAGNOSIS — M79671 Pain in right foot: Secondary | ICD-10-CM

## 2017-05-25 NOTE — Telephone Encounter (Signed)
Copied from [place] [id number]. Topic: Referral - Request >> May 25, 2017 10:20 AM Wang, Bruce Emperor wrote: Reason for CRM: Pt states his foot is not getting better. He would like to know if a referral could be called to a podiatric.   Last OV 05/09/2017-

## 2017-05-27 NOTE — Telephone Encounter (Signed)
Referral placed  Thanks!

## 2017-05-29 NOTE — Telephone Encounter (Signed)
Spoke with patient. Appointment made to see Dr Amalia Hailey at [facility] on 06/12/17 at 3 pm. Patient aware of all the information-Maritta Kief V Johnathan Heskett, RMA

## 2017-06-12 ENCOUNTER — Ambulatory Visit: Payer: Self-pay | Admitting: Podiatry

## 2017-06-15 ENCOUNTER — Ambulatory Visit: Payer: BLUE CROSS/BLUE SHIELD | Admitting: Podiatry

## 2017-06-15 ENCOUNTER — Encounter: Payer: Self-pay | Admitting: Podiatry

## 2017-06-15 ENCOUNTER — Ambulatory Visit (INDEPENDENT_AMBULATORY_CARE_PROVIDER_SITE_OTHER): Payer: BLUE CROSS/BLUE SHIELD

## 2017-06-15 DIAGNOSIS — M722 Plantar fascial fibromatosis: Secondary | ICD-10-CM | POA: Diagnosis not present

## 2017-06-15 MED ORDER — MELOXICAM 15 MG PO TABS: 15.0000 mg | ORAL_TABLET | Freq: Every day | ORAL | 1 refills | Status: AC

## 2017-06-15 MED ORDER — METHYLPREDNISOLONE 4 MG PO TBPK: ORAL_TABLET | ORAL | 0 refills | Status: DC

## 2017-06-19 NOTE — Progress Notes (Signed)
   Subjective: 52 year old male presenting today as a new patient with a chief complaint of pulling pain and tenderness to the right heel that began one month ago. He states he was hiking at [employer] and heard a popping sound when the pain began. Walking and bearing weight increases the pain. He has been using ice therapy, wearing compression bands, using gel inserts and taking Duexis with no significant relief. Patient is here for further evaluation and treatment.   Past Medical History:  Diagnosis Date  . Dyspnea   . 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 Gout   . History of chicken pox      Objective: Physical Exam General: The patient is alert and oriented x3 in no acute distress.  Dermatology: Skin is warm, dry and supple bilateral lower extremities. Negative for open lesions or macerations bilateral.   Vascular: Dorsalis Pedis and Posterior Tibial pulses palpable bilateral.  Capillary fill time is immediate to all digits.  Neurological: Epicritic and protective threshold intact bilateral.   Musculoskeletal: Tenderness to palpation to the plantar aspect of the right heel along the plantar fascia. All other joints range of motion within normal limits bilateral. Strength 5/5 in all groups bilateral.    Assessment: 1. Plantar fasciitis right 2. Pain in right foot  Plan of Care:  1. Patient evaluated. Xrays reviewed.   2. Injection of 0.5cc Celestone soluspan injected into the right plantar fascia  3. Rx for Medrol Dose pack placed 4. Prescription for Meloxicam provided to patient.  5. Instructed patient regarding therapies and modalities at home to alleviate symptoms.  6. Night splint dispensed.  7. Return to clinic in 4 weeks.     Edrick Kins, DPM [facility]  Dr. Edrick Kins, DPM    [address], [place] [address]                Office [phone]  Fax [phone]

## 2017-07-24 ENCOUNTER — Encounter: Payer: Self-pay | Admitting: Podiatry

## 2017-07-24 ENCOUNTER — Ambulatory Visit: Payer: BLUE CROSS/BLUE SHIELD | Admitting: Podiatry

## 2017-07-24 DIAGNOSIS — M722 Plantar fascial fibromatosis: Secondary | ICD-10-CM | POA: Diagnosis not present

## 2017-07-24 NOTE — Progress Notes (Signed)
   Subjective: 52 year old male presenting today for follow up evaluation of plantar fasciitis of the right foot. He states his pain has improved but he reports some soreness when standing or walking for long periods of time. He has been taking Meloxicam which helps alleviate the symptoms. Patient is here for further evaluation and treatment.   Past Medical History:  Diagnosis Date  . Dyspnea   . 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 Gout   . History of chicken pox      Objective: Physical Exam General: The patient is alert and oriented x3 in no acute distress.  Dermatology: Skin is warm, dry and supple bilateral lower extremities. Negative for open lesions or macerations bilateral.   Vascular: Dorsalis Pedis and Posterior Tibial pulses palpable bilateral.  Capillary fill time is immediate to all digits.  Neurological: Epicritic and protective threshold intact bilateral.   Musculoskeletal: Tenderness to palpation to the plantar aspect of the right heel along the plantar fascia. All other joints range of motion within normal limits bilateral. Strength 5/5 in all groups bilateral.    Assessment: 1. Plantar fasciitis right - improved    Plan of Care:  1. Patient evaluated.   2. Injection of 0.5cc Celestone soluspan injected into the right plantar fascia  3. Continue taking meloxicam and wearing night splint.  4. Recommended good shoe gear.  5. Return to clinic in 4 weeks.      Edrick Kins, DPM [facility]  Dr. Edrick Kins, DPM    [address], [place] [address]                Office [phone]  Fax [phone]

## 2017-08-24 ENCOUNTER — Ambulatory Visit: Payer: BLUE CROSS/BLUE SHIELD | Admitting: Podiatry

## 2017-10-05 ENCOUNTER — Encounter: Payer: Self-pay | Admitting: Family Medicine

## 2017-10-05 ENCOUNTER — Ambulatory Visit: Payer: BLUE CROSS/BLUE SHIELD | Admitting: Family Medicine

## 2017-10-05 VITALS — BP 100/68 | HR 80 | Temp 98.6°F | Ht 72.0 in | Wt 263.0 lb

## 2017-10-05 DIAGNOSIS — R05 Cough: Secondary | ICD-10-CM

## 2017-10-05 DIAGNOSIS — R059 Cough, unspecified: Secondary | ICD-10-CM

## 2017-10-05 DIAGNOSIS — R062 Wheezing: Secondary | ICD-10-CM

## 2017-10-05 MED ORDER — ALBUTEROL SULFATE HFA 108 (90 BASE) MCG/ACT IN AERS: 2.0000 | INHALATION_SPRAY | RESPIRATORY_TRACT | 1 refills | Status: DC | PRN

## 2017-10-05 MED ORDER — IPRATROPIUM-ALBUTEROL 0.5-2.5 (3) MG/3ML IN SOLN
3.0000 mL | Freq: Once | RESPIRATORY_TRACT | Status: AC
Start: 2017-10-05 — End: 2017-10-05
  Administered 2017-10-05: 3 mL via RESPIRATORY_TRACT

## 2017-10-05 MED ORDER — AMOXICILLIN 875 MG PO TABS: 875.0000 mg | ORAL_TABLET | Freq: Two times a day (BID) | ORAL | 0 refills | Status: DC

## 2017-10-05 NOTE — Patient Instructions (Signed)
Use albuterol inhaler, 2 puffs every 4 to 6 hours while awake today and tomorrow then every 4 to 6 hours as needed  Continue Mucinex, good fluid intake  If not better in 3-5 days, can start antibiotic  For nasal congestion you can use Afrin nasal spray for 3 days max, Sudafed, saline nasal spray (generic is fine for all). For cough you can try Delsym. Drink enough fluids to make your urine light yellow. For fever/chill/muscle aches you can take over the counter acetaminophen or ibuprofen.  Please come back in if you are not better in 5-7 days or if you develop wheezing, shortness of breath or persistent vomiting.

## 2017-10-05 NOTE — Progress Notes (Signed)
Subjective:    Patient ID: Bruce Wang, male    DOB: January 08, 1966, 52 y.o.   MRN: [id number]  HPI This is a 52 yo male who presents today with cough x 1 week. Cough is dry. No sore throat, little runny nose. No fever since last week. Some wheezing. No SOB.  Has been taking mucinex max without relief. No history of asthma, has never needed inhaler. Feels better today, mostly bothered by cough.   Past Medical History:  Diagnosis Date  . Dyspnea   . 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 Gout   . History of chicken pox    Past Surgical History:  Procedure Laterality Date  . COLONOSCOPY WITH PROPOFOL N/A 07/20/2016   1 TA, rpt 5 yrs Allen Norris, Darren, MD)  . INGUINAL HERNIA REPAIR  1990s  . POLYPECTOMY  07/20/2016   Procedure: POLYPECTOMY;  Surgeon: Lucilla Lame, MD;  Location: [facility];  Service: Endoscopy;;  . ROUX-EN-Y PROCEDURE  2000   Family History  Problem Relation Age of Onset  . Hypertension Mother   . Hypertension Sister   . Cancer Paternal Uncle 87       colon cancer, prostate cancer  . Dementia Maternal Grandmother   . Parkinsonism Maternal Grandfather   . Parkinson's disease Maternal Grandfather   . Macular degeneration Father   . Macular degeneration Paternal Grandmother   . Coronary artery disease Neg Hx   . Stroke Neg Hx   . Diabetes Neg Hx    Social History   Tobacco Use  . Smoking status: Former Smoker    Last attempt to quit: 01/09/2010    Years since quitting: 7.7  . Smokeless tobacco: Never Used  Substance Use Topics  . Alcohol use: Yes    Alcohol/week: 21.0 standard drinks    Types: 21 Cans of beer per week    Comment: Regular on weekends  . Drug use: No      Review of Systems Per HPI    Objective:   Physical Exam  Constitutional: He is oriented to person, place, and time. He appears well-developed and well-nourished. No distress.  HENT:  Head: Normocephalic and atraumatic.  Right Ear: External ear normal.  Left  Ear: External ear normal.  Nose: Mucosal edema and rhinorrhea present.  Mouth/Throat: Oropharynx is clear and moist.  Eyes: Conjunctivae are normal.  Cardiovascular: Normal rate, regular rhythm and normal heart sounds.  Pulmonary/Chest: Effort normal. No stridor. No respiratory distress. He has wheezes (posterior expiratory).  Musculoskeletal: He exhibits no edema.  Neurological: He is alert and oriented to person, place, and time.  Skin: Skin is warm and dry. He is not diaphoretic.  Psychiatric: He has a normal mood and affect. His behavior is normal. Judgment and thought content normal.  Vitals reviewed.     BP 100/68 (BP Location: Left Arm, Patient Position: Sitting, Cuff Size: Large)   Pulse 80   Temp 98.6 F (37 C) (Oral)   Ht 6' (1.829 m)   Wt 263 lb (119.3 kg)   SpO2 97%   BMI 35.67 kg/m  Wt Readings from Last 3 Encounters:  10/05/17 263 lb (119.3 kg)  05/09/17 269 lb (122 kg)  01/26/17 294 lb (133.4 kg)   Given Duo neb nebulizer treatment in office with subjective improvement and significant decrease in wheezing.      Assessment & Plan:  1. Cough - suspect post viral, provided albuterol inhaler, wait and see antibiotic prescriptions  and additional instructions - Patient Instructions  Use albuterol inhaler, 2 puffs every 4 to 6 hours while awake today and tomorrow then every 4 to 6 hours as needed  Continue Mucinex, good fluid intake  If not better in 3-5 days, can start antibiotic  For nasal congestion you can use Afrin nasal spray for 3 days max, Sudafed, saline nasal spray (generic is fine for all). For cough you can try Delsym. Drink enough fluids to make your urine light yellow. For fever/chill/muscle aches you can take over the counter acetaminophen or ibuprofen.  Please come back in if you are not better in 5-7 days or if you develop wheezing, shortness of breath or persistent vomiting.     - ipratropium-albuterol (DUONEB) 0.5-2.5 (3) MG/3ML nebulizer  solution 3 mL - albuterol (PROVENTIL HFA;VENTOLIN HFA) 108 (90 Base) MCG/ACT inhaler; Inhale 2 puffs into the lungs every 4 (four) hours as needed for wheezing or shortness of breath (cough, shortness of breath or wheezing.).  Dispense: 1 Inhaler; Refill: 1  2. Wheeze - ipratropium-albuterol (DUONEB) 0.5-2.5 (3) MG/3ML nebulizer solution 3 mL - albuterol (PROVENTIL HFA;VENTOLIN HFA) 108 (90 Base) MCG/ACT inhaler; Inhale 2 puffs into the lungs every 4 (four) hours as needed for wheezing or shortness of breath (cough, shortness of breath or wheezing.).  Dispense: 1 Inhaler; Refill: [place], FNP-BC  [facility]  10/08/2017 9:00 PM

## 2017-10-08 ENCOUNTER — Encounter: Payer: Self-pay | Admitting: Family Medicine

## 2017-10-14 ENCOUNTER — Encounter: Payer: Self-pay | Admitting: Family Medicine

## 2017-10-14 DIAGNOSIS — J302 Other seasonal allergic rhinitis: Secondary | ICD-10-CM

## 2017-10-18 MED ORDER — ALLOPURINOL 300 MG PO TABS: 300.0000 mg | ORAL_TABLET | Freq: Every day | ORAL | 1 refills | Status: DC

## 2017-10-18 MED ORDER — TADALAFIL 20 MG PO TABS: 20.0000 mg | ORAL_TABLET | ORAL | 11 refills | Status: DC | PRN

## 2017-11-14 ENCOUNTER — Other Ambulatory Visit: Payer: Self-pay | Admitting: Family Medicine

## 2017-11-14 DIAGNOSIS — E538 Deficiency of other specified B group vitamins: Secondary | ICD-10-CM

## 2017-11-14 DIAGNOSIS — Z9884 Bariatric surgery status: Secondary | ICD-10-CM

## 2017-11-14 DIAGNOSIS — E559 Vitamin D deficiency, unspecified: Secondary | ICD-10-CM

## 2017-11-14 DIAGNOSIS — E611 Iron deficiency: Secondary | ICD-10-CM

## 2017-11-14 DIAGNOSIS — M1A0720 Idiopathic chronic gout, left ankle and foot, without tophus (tophi): Secondary | ICD-10-CM

## 2017-11-14 DIAGNOSIS — M109 Gout, unspecified: Secondary | ICD-10-CM

## 2017-11-14 DIAGNOSIS — Z125 Encounter for screening for malignant neoplasm of prostate: Secondary | ICD-10-CM

## 2017-11-16 ENCOUNTER — Other Ambulatory Visit (INDEPENDENT_AMBULATORY_CARE_PROVIDER_SITE_OTHER): Payer: BLUE CROSS/BLUE SHIELD

## 2017-11-16 DIAGNOSIS — E538 Deficiency of other specified B group vitamins: Secondary | ICD-10-CM | POA: Diagnosis not present

## 2017-11-16 DIAGNOSIS — Z9884 Bariatric surgery status: Secondary | ICD-10-CM

## 2017-11-16 DIAGNOSIS — E611 Iron deficiency: Secondary | ICD-10-CM

## 2017-11-16 DIAGNOSIS — E559 Vitamin D deficiency, unspecified: Secondary | ICD-10-CM

## 2017-11-16 DIAGNOSIS — M1A0720 Idiopathic chronic gout, left ankle and foot, without tophus (tophi): Secondary | ICD-10-CM | POA: Diagnosis not present

## 2017-11-16 DIAGNOSIS — Z125 Encounter for screening for malignant neoplasm of prostate: Secondary | ICD-10-CM

## 2017-11-16 LAB — COMPREHENSIVE METABOLIC PANEL
ALK PHOS: 53 U/L (ref 39–117)
ALT: 17 U/L (ref 0–53)
AST: 20 U/L (ref 0–37)
Albumin: 4.3 g/dL (ref 3.5–5.2)
BILIRUBIN TOTAL: 0.3 mg/dL (ref 0.2–1.2)
BUN: 13 mg/dL (ref 6–23)
CALCIUM: 9.5 mg/dL (ref 8.4–10.5)
CO2: 31 mEq/L (ref 19–32)
Chloride: 105 mEq/L (ref 96–112)
Creatinine, Ser: 1.03 mg/dL (ref 0.40–1.50)
GFR: 80.84 mL/min (ref >60.00)
Glucose, Bld: 87 mg/dL (ref 70–99)
Potassium: 4.4 mEq/L (ref 3.5–5.1)
Sodium: 142 mEq/L (ref 135–145)
TOTAL PROTEIN: 6.5 g/dL (ref 6.0–8.3)

## 2017-11-16 LAB — CBC WITH DIFFERENTIAL/PLATELET
BASOS PCT: 0.8 % (ref 0.0–3.0)
Basophils Absolute: 0 10*3/uL (ref 0.0–0.1)
EOS PCT: 8.1 % — AB (ref 0.0–5.0)
Eosinophils Absolute: 0.3 10*3/uL (ref 0.0–0.7)
HEMATOCRIT: 43.1 % (ref 39.0–52.0)
HEMOGLOBIN: 14.4 g/dL (ref 13.0–17.0)
LYMPHS PCT: 44 % (ref 12.0–46.0)
Lymphs Abs: 1.6 10*3/uL (ref 0.7–4.0)
MCHC: 33.3 g/dL (ref 30.0–36.0)
MCV: 90.6 fl (ref 78.0–100.0)
MONO ABS: 0.3 10*3/uL (ref 0.1–1.0)
Monocytes Relative: 6.8 % (ref 3.0–12.0)
NEUTROS ABS: 1.5 10*3/uL (ref 1.4–7.7)
Neutrophils Relative %: 40.3 % — ABNORMAL LOW (ref 43.0–77.0)
PLATELETS: 226 10*3/uL (ref 150.0–400.0)
RBC: 4.76 Mil/uL (ref 4.22–5.81)
RDW: 14.8 % (ref 11.5–15.5)
WBC: 3.7 10*3/uL — ABNORMAL LOW (ref 4.0–10.5)

## 2017-11-16 LAB — TSH: TSH: 1.98 u[IU]/mL (ref 0.35–4.50)

## 2017-11-16 LAB — FERRITIN: FERRITIN: 19.9 ng/mL — AB (ref 22.0–322.0)

## 2017-11-16 LAB — IBC PANEL
Iron: 44 ug/dL (ref 42–165)
SATURATION RATIOS: 10.2 % — AB (ref 20.0–50.0)
TRANSFERRIN: 307 mg/dL (ref 212.0–360.0)

## 2017-11-16 LAB — LIPID PANEL
CHOLESTEROL: 123 mg/dL (ref 0–200)
HDL: 50 mg/dL (ref >39.00)
LDL Cholesterol: 52 mg/dL (ref 0–99)
NonHDL: 73.2
TRIGLYCERIDES: 108 mg/dL (ref 0.0–149.0)
Total CHOL/HDL Ratio: 2
VLDL: 21.6 mg/dL (ref 0.0–40.0)

## 2017-11-16 LAB — VITAMIN B12: VITAMIN B 12: 244 pg/mL (ref 211–911)

## 2017-11-16 LAB — VITAMIN D 25 HYDROXY (VIT D DEFICIENCY, FRACTURES): VITD: 42.28 ng/mL (ref 30.00–100.00)

## 2017-11-16 LAB — URIC ACID: Uric Acid, Serum: 5.3 mg/dL (ref 4.0–7.8)

## 2017-11-16 LAB — PSA: PSA: 0.66 ng/mL (ref 0.10–4.00)

## 2017-11-21 ENCOUNTER — Encounter: Payer: Self-pay | Admitting: Family Medicine

## 2017-11-21 ENCOUNTER — Ambulatory Visit (INDEPENDENT_AMBULATORY_CARE_PROVIDER_SITE_OTHER): Payer: BLUE CROSS/BLUE SHIELD | Admitting: Family Medicine

## 2017-11-21 VITALS — BP 124/80 | HR 94 | Temp 97.9°F | Ht 70.5 in | Wt 264.8 lb

## 2017-11-21 DIAGNOSIS — E538 Deficiency of other specified B group vitamins: Secondary | ICD-10-CM

## 2017-11-21 DIAGNOSIS — E559 Vitamin D deficiency, unspecified: Secondary | ICD-10-CM

## 2017-11-21 DIAGNOSIS — Z0000 Encounter for general adult medical examination without abnormal findings: Secondary | ICD-10-CM | POA: Diagnosis not present

## 2017-11-21 DIAGNOSIS — E611 Iron deficiency: Secondary | ICD-10-CM

## 2017-11-21 DIAGNOSIS — Z9884 Bariatric surgery status: Secondary | ICD-10-CM

## 2017-11-21 DIAGNOSIS — N529 Male erectile dysfunction, unspecified: Secondary | ICD-10-CM

## 2017-11-21 DIAGNOSIS — M1A0720 Idiopathic chronic gout, left ankle and foot, without tophus (tophi): Secondary | ICD-10-CM

## 2017-11-21 MED ORDER — FERROUS SULFATE DRIED ER 160 (50 FE) MG PO TBCR: 160.0000 mg | EXTENDED_RELEASE_TABLET | ORAL | 3 refills | Status: DC

## 2017-11-21 MED ORDER — FERROUS SULFATE DRIED ER 160 (50 FE) MG PO TBCR: 160.0000 mg | EXTENDED_RELEASE_TABLET | Freq: Every day | ORAL | 3 refills | Status: DC

## 2017-11-21 NOTE — Assessment & Plan Note (Signed)
rec restart 558mcg b12 MWF (daily dosing led to high levels)

## 2017-11-21 NOTE — Assessment & Plan Note (Signed)
Preventative protocols reviewed and updated unless pt declined. Discussed healthy diet and lifestyle.  

## 2017-11-21 NOTE — Patient Instructions (Addendum)
Let me know # covered per month by insurance for cialis.  Restart vitamin b12 561mcg MWF.  Start slo-FE iron tablet sent to pharmacy MWF.  You are doing well today. Continue healthy diet   Health Maintenance, Male A healthy lifestyle and preventive care is important for your health and wellness. Ask your health care provider about what schedule of regular examinations is right for you. What should I know about weight and diet? Eat a Healthy Diet  Eat plenty of vegetables, fruits, whole grains, low-fat dairy products, and lean protein.  Do not eat a lot of foods high in solid fats, added sugars, or salt.  Maintain a Healthy Weight Regular exercise can help you achieve or maintain a healthy weight. You should:  Do at least 150 minutes of exercise each week. The exercise should increase your heart rate and make you sweat (moderate-intensity exercise).  Do strength-training exercises at least twice a week.  Watch Your Levels of Cholesterol and Blood Lipids  Have your blood tested for lipids and cholesterol every 5 years starting at 51 years of age. If you are at high risk for heart disease, you should start having your blood tested when you are 51 years old. You may need to have your cholesterol levels checked more often if: ? Your lipid or cholesterol levels are high. ? You are older than 51 years of age. ? You are at high risk for heart disease.  What should I know about cancer screening? Many types of cancers can be detected early and may often be prevented. Lung Cancer  You should be screened every year for lung cancer if: ? You are a current smoker who has smoked for at least 30 years. ? You are a former smoker who has quit within the past 15 years.  Talk to your health care provider about your screening options, when you should start screening, and how often you should be screened.  Colorectal Cancer  Routine colorectal cancer screening usually begins at 51 years of age and  should be repeated every 5-10 years until you are 51 years old. You may need to be screened more often if early forms of precancerous polyps or small growths are found. Your health care provider may recommend screening at an earlier age if you have risk factors for colon cancer.  Your health care provider may recommend using home test kits to check for hidden blood in the stool.  A small camera at the end of a tube can be used to examine your colon (sigmoidoscopy or colonoscopy). This checks for the earliest forms of colorectal cancer.  Prostate and Testicular Cancer  Depending on your age and overall health, your health care provider may do certain tests to screen for prostate and testicular cancer.  Talk to your health care provider about any symptoms or concerns you have about testicular or prostate cancer.  Skin Cancer  Check your skin from head to toe regularly.  Tell your health care provider about any new moles or changes in moles, especially if: ? There is a change in a mole's size, shape, or color. ? You have a mole that is larger than a pencil eraser.  Always use sunscreen. Apply sunscreen liberally and repeat throughout the day.  Protect yourself by wearing long sleeves, pants, a wide-brimmed hat, and sunglasses when outside.  What should I know about heart disease, diabetes, and high blood pressure?  If you are 50-57 years of age, have your blood pressure checked every  3-5 years. If you are 75 years of age or older, have your blood pressure checked every year. You should have your blood pressure measured twice-once when you are at a hospital or clinic, and once when you are not at a hospital or clinic. Record the average of the two measurements. To check your blood pressure when you are not at a hospital or clinic, you can use: ? An automated blood pressure machine at a pharmacy. ? A home blood pressure monitor.  Talk to your health care provider about your target blood  pressure.  If you are between 75-71 years old, ask your health care provider if you should take aspirin to prevent heart disease.  Have regular diabetes screenings by checking your fasting blood sugar level. ? If you are at a normal weight and have a low risk for diabetes, have this test once every three years after the age of 77. ? If you are overweight and have a high risk for diabetes, consider being tested at a younger age or more often.  A one-time screening for abdominal aortic aneurysm (AAA) by ultrasound is recommended for men aged 25-75 years who are current or former smokers. What should I know about preventing infection? Hepatitis B If you have a higher risk for hepatitis B, you should be screened for this virus. Talk with your health care provider to find out if you are at risk for hepatitis B infection. Hepatitis C Blood testing is recommended for:  Everyone born from [birth year] through 1964.  Anyone with known risk factors for hepatitis C.  Sexually Transmitted Diseases (STDs)  You should be screened each year for STDs including gonorrhea and chlamydia if: ? You are sexually active and are younger than 51 years of age. ? You are older than 51 years of age and your health care provider tells you that you are at risk for this type of infection. ? Your sexual activity has changed since you were last screened and you are at an increased risk for chlamydia or gonorrhea. Ask your health care provider if you are at risk.  Talk with your health care provider about whether you are at high risk of being infected with HIV. Your health care provider may recommend a prescription medicine to help prevent HIV infection.  What else can I do?  Schedule regular health, dental, and eye exams.  Stay current with your vaccines (immunizations).  Do not use any tobacco products, such as cigarettes, chewing tobacco, and e-cigarettes. If you need help quitting, ask your health care  provider.  Limit alcohol intake to no more than 2 drinks per day. One drink equals 12 ounces of beer, 5 ounces of wine, or 1 ounces of hard liquor.  Do not use street drugs.  Do not share needles.  Ask your health care provider for help if you need support or information about quitting drugs.  Tell your health care provider if you often feel depressed.  Tell your health care provider if you have ever been abused or do not feel safe at home. This information is not intended to replace advice given to you by your health care provider. Make sure you discuss any questions you have with your health care provider. Document Released: 06/24/2007 Document Revised: 08/25/2015 Document Reviewed: 09/29/2014 Elsevier Interactive Patient Education  [employer].

## 2017-11-21 NOTE — Progress Notes (Signed)
BP 124/80 (BP Location: Right Arm, Patient Position: Sitting, Cuff Size: Large)   Pulse 94   Temp 97.9 F (36.6 C) (Oral)   Ht 5' 10.5" (1.791 m)   Wt 264 lb 12 oz (120.1 kg)   SpO2 96%   BMI 37.45 kg/m    CC: CPE Subjective:    Patient ID: Bruce Wang, male    DOB: 1965-10-31, 52 y.o.   MRN: [id number]  HPI: Bruce Wang is a 52 y.o. male presenting on 11/21/2017 for Annual Exam (Has form to be completed. )   30 lb weight loss since 02/2017. Aiming for 220lbs. Alternating keto diet with weight watcher's diet.   Preventative: COLONOSCOPY WITH PROPOFOL 07/20/2016 - 1 TA, rpt 5 yrs ([place]) Prostate cancer screening - fmhx (paternal uncle). Discussed,  Flu shot - did not receive. Tdap 2013 Seat belt use discussed Sunscreen use discussed. No changing moles on skin. Non smoker Alcohol - 6 pack a week, on weekends Dentist q6 mo Eye exam yearly  Caffeine: 16-24 oz coffee/day Lives with wife, 2 children, 2 dogs Occupation: Web designer - working days  Edu: 2 yrs college Activity: walking 2-3 mi/day Diet: good water, fruits/vegetables daily, red meat 2x/wk, never fish.  Alternates between low carb and eating healthy. doing weight watchers plan using points.   Relevant past medical, surgical, family and social history reviewed and updated as indicated. Interim medical history since our last visit reviewed. Allergies and medications reviewed and updated. Outpatient Medications Prior to Visit  Medication Sig Dispense Refill  . allopurinol (ZYLOPRIM) 300 MG tablet Take 1 tablet (300 mg total) by mouth daily. 90 tablet 1  . cetirizine (ZYRTEC) 10 MG tablet Take 10 mg by mouth daily.    . Cholecalciferol (VITAMIN D) 2000 units CAPS Take 1 capsule by mouth daily.    . fluticasone (FLONASE) 50 MCG/ACT nasal spray USE 2 SPRAYS IN BOTH NOSTRILS DAILY 16 g 5  . tadalafil (ADCIRCA/CIALIS) 20 MG tablet Take 1 tablet (20 mg total) by mouth every other day as needed for erectile  dysfunction. 5 tablet 11  . vitamin C (ASCORBIC ACID) 500 MG tablet Take 500 mg by mouth daily.    . vitamin B-12 (V-R VITAMIN B-12) 500 MCG tablet Take 1 tablet (500 mcg total) by mouth every Monday, Wednesday, and Friday.    Marland Kitchen albuterol (PROVENTIL HFA;VENTOLIN HFA) 108 (90 Base) MCG/ACT inhaler Inhale 2 puffs into the lungs every 4 (four) hours as needed for wheezing or shortness of breath (cough, shortness of breath or wheezing.). 1 Inhaler 1  . amoxicillin (AMOXIL) 875 MG tablet Take 1 tablet (875 mg total) by mouth 2 (two) times daily. 14 tablet 0  . methylPREDNISolone (MEDROL DOSEPAK) 4 MG TBPK tablet 6 day dose pack - take as directed 21 tablet 0  . naproxen (NAPROSYN) 500 MG tablet Take one po bid x 1 week then prn pain, take with food 40 tablet 0   No facility-administered medications prior to visit.      Per HPI unless specifically indicated in ROS section below Review of Systems  Constitutional: Negative for activity change, appetite change, chills, fatigue, fever and unexpected weight change.  HENT: Negative for hearing loss.   Eyes: Negative for visual disturbance.  Respiratory: Positive for cough (finally resolved). Negative for chest tightness, shortness of breath and wheezing.   Cardiovascular: Negative for chest pain, palpitations and leg swelling.  Gastrointestinal: Negative for abdominal distention, abdominal pain, blood in stool, constipation, diarrhea, nausea and  vomiting.  Genitourinary: Negative for difficulty urinating and hematuria.  Musculoskeletal: Negative for arthralgias, myalgias and neck pain.  Skin: Negative for rash.  Neurological: Negative for dizziness, seizures, syncope and headaches.  Hematological: Negative for adenopathy. Does not bruise/bleed easily.  Psychiatric/Behavioral: Negative for dysphoric mood. The patient is not nervous/anxious.        Objective:    BP 124/80 (BP Location: Right Arm, Patient Position: Sitting, Cuff Size: Large)   Pulse  94   Temp 97.9 F (36.6 C) (Oral)   Ht 5' 10.5" (1.791 m)   Wt 264 lb 12 oz (120.1 kg)   SpO2 96%   BMI 37.45 kg/m   Wt Readings from Last 3 Encounters:  11/21/17 264 lb 12 oz (120.1 kg)  10/05/17 263 lb (119.3 kg)  05/09/17 269 lb (122 kg)    Physical Exam  Constitutional: He is oriented to person, place, and time. He appears well-developed and well-nourished. No distress.  HENT:  Head: Normocephalic and atraumatic.  Right Ear: Hearing, tympanic membrane, external ear and ear canal normal.  Left Ear: Hearing, tympanic membrane, external ear and ear canal normal.  Nose: Nose normal.  Mouth/Throat: Uvula is midline, oropharynx is clear and moist and mucous membranes are normal. No oropharyngeal exudate, posterior oropharyngeal edema or posterior oropharyngeal erythema.  Eyes: Pupils are equal, round, and reactive to light. Conjunctivae and EOM are normal. No scleral icterus.  Neck: Normal range of motion. Neck supple.  Cardiovascular: Normal rate, regular rhythm, normal heart sounds and intact distal pulses.  No murmur heard. Pulses:      Radial pulses are 2+ on the right side, and 2+ on the left side.  Pulmonary/Chest: Effort normal and breath sounds normal. No respiratory distress. He has no wheezes. He has no rales.  Abdominal: Soft. Bowel sounds are normal. He exhibits no distension and no mass. There is no tenderness. There is no rebound and no guarding.  Musculoskeletal: Normal range of motion. He exhibits no edema.  Lymphadenopathy:    He has no cervical adenopathy.  Neurological: He is alert and oriented to person, place, and time.  CN grossly intact, station and gait intact  Skin: Skin is warm and dry. No rash noted.  Psychiatric: He has a normal mood and affect. His behavior is normal. Judgment and thought content normal.  Nursing note and vitals reviewed.  Results for orders placed or performed in visit on 11/16/17  PSA  Result Value Ref Range   PSA 0.66 0.10 - 4.00  ng/mL  CBC with Differential/Platelet  Result Value Ref Range   WBC 3.7 (L) 4.0 - 10.5 K/uL   RBC 4.76 4.22 - 5.81 Mil/uL   Hemoglobin 14.4 13.0 - 17.0 g/dL   HCT 43.1 39.0 - 52.0 %   MCV 90.6 78.0 - 100.0 fl   MCHC 33.3 30.0 - 36.0 g/dL   RDW 14.8 11.5 - 15.5 %   Platelets 226.0 150.0 - 400.0 K/uL   Neutrophils Relative % 40.3 (L) 43.0 - 77.0 %   Lymphocytes Relative 44.0 12.0 - 46.0 %   Monocytes Relative 6.8 3.0 - 12.0 %   Eosinophils Relative 8.1 (H) 0.0 - 5.0 %   Basophils Relative 0.8 0.0 - 3.0 %   Neutro Abs 1.5 1.4 - 7.7 K/uL   Lymphs Abs 1.6 0.7 - 4.0 K/uL   Monocytes Absolute 0.3 0.1 - 1.0 K/uL   Eosinophils Absolute 0.3 0.0 - 0.7 K/uL   Basophils Absolute 0.0 0.0 - 0.1 K/uL  Uric  acid  Result Value Ref Range   Uric Acid, Serum 5.3 4.0 - 7.8 mg/dL  TSH  Result Value Ref Range   TSH 1.98 0.35 - 4.50 uIU/mL  Comprehensive metabolic panel  Result Value Ref Range   Sodium 142 135 - 145 mEq/L   Potassium 4.4 3.5 - 5.1 mEq/L   Chloride 105 96 - 112 mEq/L   CO2 31 19 - 32 mEq/L   Glucose, Bld 87 70 - 99 mg/dL   BUN 13 6 - 23 mg/dL   Creatinine, Ser 1.03 0.40 - 1.50 mg/dL   Total Bilirubin 0.3 0.2 - 1.2 mg/dL   Alkaline Phosphatase 53 39 - 117 U/L   AST 20 0 - 37 U/L   ALT 17 0 - 53 U/L   Total Protein 6.5 6.0 - 8.3 g/dL   Albumin 4.3 3.5 - 5.2 g/dL   Calcium 9.5 8.4 - 10.5 mg/dL   GFR 80.84 >60.00 mL/min  Lipid panel  Result Value Ref Range   Cholesterol 123 0 - 200 mg/dL   Triglycerides 108.0 0.0 - 149.0 mg/dL   HDL 50.00 >39.00 mg/dL   VLDL 21.6 0.0 - 40.0 mg/dL   LDL Cholesterol 52 0 - 99 mg/dL   Total CHOL/HDL Ratio 2    NonHDL 73.20   VITAMIN D 25 Hydroxy (Vit-D Deficiency, Fractures)  Result Value Ref Range   VITD 42.28 30.00 - 100.00 ng/mL  IBC panel  Result Value Ref Range   Iron 44 42 - 165 ug/dL   Transferrin 307.0 212.0 - 360.0 mg/dL   Saturation Ratios 10.2 (L) 20.0 - 50.0 %  Ferritin  Result Value Ref Range   Ferritin 19.9 (L) 22.0 -  322.0 ng/mL  Vitamin B12  Result Value Ref Range   Vitamin B-12 244 211 - 911 pg/mL      Assessment & Plan:   Problem List Items Addressed This Visit    Vitamin D deficiency    Chronic, good replacement on 2000 IU daily.       Vitamin B12 deficiency    rec restart 570mcg b12 MWF (daily dosing led to high levels)       Severe obesity (BMI 35.0-39.9) with comorbidity ([place])    Encouraged healthy diet and lifestyle changes to affect sustainable weight loss. Pt motivated to continue weight watcher's point-like APP program.       Iron deficiency    Levels remain low - rec start lower dose iron MWF (Slo-FE)      Health maintenance examination - Primary    Preventative protocols reviewed and updated unless pt declined. Discussed healthy diet and lifestyle.       Gout    Chronic, urate stable. No recent gout flares. Continue allopurinol 300mg  daily.       Gastric bypass status for obesity   Erectile dysfunction    He will check on quantity covered per month or 3 month course by insurance.           Meds ordered this encounter  Medications  . DISCONTD: ferrous sulfate (SLOW IRON) 160 (50 Fe) MG TBCR SR tablet    Sig: Take 1 tablet (160 mg total) by mouth daily.    Dispense:  90 each    Refill:  3  . ferrous sulfate (SLOW IRON) 160 (50 Fe) MG TBCR SR tablet    Sig: Take 1 tablet (160 mg total) by mouth every Monday, Wednesday, and Friday.    Dispense:  45 each    Refill:  3    Use this sig   No orders of the defined types were placed in this encounter.   Follow up plan: Return in about 1 year (around 11/22/2018) for annual exam, prior fasting for blood work.  [name], MD

## 2017-11-21 NOTE — Assessment & Plan Note (Signed)
Encouraged healthy diet and lifestyle changes to affect sustainable weight loss. Pt motivated to continue weight watcher's point-like APP program.

## 2017-11-21 NOTE — Assessment & Plan Note (Signed)
He will check on quantity covered per month or 3 month course by insurance.

## 2017-11-21 NOTE — Assessment & Plan Note (Signed)
Chronic, urate stable. No recent gout flares. Continue allopurinol 300mg  daily.

## 2017-11-21 NOTE — Assessment & Plan Note (Signed)
Levels remain low - rec start lower dose iron MWF (Slo-FE)

## 2017-11-21 NOTE — Assessment & Plan Note (Signed)
Chronic, good replacement on 2000 IU daily.

## 2017-11-22 ENCOUNTER — Encounter: Payer: Self-pay | Admitting: Family Medicine

## 2017-11-26 ENCOUNTER — Other Ambulatory Visit: Payer: Self-pay | Admitting: Family Medicine

## 2017-11-26 MED ORDER — TADALAFIL 20 MG PO TABS: 20.0000 mg | ORAL_TABLET | ORAL | 11 refills | Status: DC | PRN

## 2018-02-14 ENCOUNTER — Ambulatory Visit: Payer: BLUE CROSS/BLUE SHIELD | Admitting: Family Medicine

## 2018-02-14 ENCOUNTER — Encounter: Payer: Self-pay | Admitting: Family Medicine

## 2018-02-14 VITALS — BP 108/76 | HR 127 | Temp 99.0°F | Ht 70.5 in | Wt 263.5 lb

## 2018-02-14 DIAGNOSIS — R509 Fever, unspecified: Secondary | ICD-10-CM

## 2018-02-14 DIAGNOSIS — J101 Influenza due to other identified influenza virus with other respiratory manifestations: Secondary | ICD-10-CM | POA: Diagnosis not present

## 2018-02-14 LAB — POCT INFLUENZA A/B
INFLUENZA A, POC: POSITIVE — AB
Influenza B, POC: NEGATIVE

## 2018-02-14 MED ORDER — OSELTAMIVIR PHOSPHATE 75 MG PO CAPS: 75.0000 mg | ORAL_CAPSULE | Freq: Two times a day (BID) | ORAL | 0 refills | Status: AC

## 2018-02-14 NOTE — Patient Instructions (Signed)
You have the flu.   This is highly contagious -- you are contagious 1-2 days before you develop symptoms and for up to 7 days after becoming sick.   Oseltamivir (Tamiflu) -- is helpful if started within 48 hours of symptoms. Ideally within 24 hours of symptoms. This may decrease the length of illness by 1 day and decrease the severity of your illness.    Based on your symptoms, it looks like you have a virus.   Antibiotics are not need for a viral infection but the following will help:   1. Drink plenty of fluids 2. Get lots of rest  Sinus Congestion 1) Neti Pot (Saline rinse) -- 2 times day -- if tolerated 2) Flonase (Store Brand ok) - once daily 3) Over the counter congestion medications  Cough 1) Cough drops can be helpful 2) Nyquil (or nighttime cough medication) 3) Honey is proven to be one of the best cough medications   Sore Throat 1) Honey as above, cough drops 2) Ibuprofen or Aleve can be helpful 3) Salt water Gargles  If you develop fevers (Temperature >100.4), chills, worsening symptoms or symptoms lasting longer than 10 days return to clinic.

## 2018-02-14 NOTE — Progress Notes (Signed)
Subjective:     Bruce Wang is a 53 y.o. male presenting for Fever (fever off and on since tuesday 02/12/2018-102-102 at highest, body aches, nasal congestion, chest congestion, runny nose, sneezing, headaches. Taking Motrin and Advil.)     Fever   This is a new problem. The current episode started in the past 7 days. The problem has been unchanged. The maximum temperature noted was 102 to 102.9 F. Associated symptoms include congestion, coughing, diarrhea, headaches, muscle aches, a sore throat and wheezing. Pertinent negatives include no abdominal pain, chest pain, ear pain, nausea or vomiting. He has tried acetaminophen and NSAIDs (Nyquil cold and flu) for the symptoms. The treatment provided moderate relief.  Risk factors comment:  Works with a lot of people w/ the flu    Did not get the flu shot this year  Review of Systems  Constitutional: Positive for fever.  HENT: Positive for congestion and sore throat. Negative for ear pain.   Respiratory: Positive for cough and wheezing.   Cardiovascular: Negative for chest pain.  Gastrointestinal: Positive for diarrhea. Negative for abdominal pain, nausea and vomiting.  Neurological: Positive for headaches.     Social History   Tobacco Use  Smoking Status Former Smoker  . Last attempt to quit: 01/09/2010  . Years since quitting: 8.1  Smokeless Tobacco Never Used        Objective:    BP Readings from Last 3 Encounters:  02/14/18 108/76  11/21/17 124/80  10/05/17 100/68   Wt Readings from Last 3 Encounters:  02/14/18 263 lb 8 oz (119.5 kg)  11/21/17 264 lb 12 oz (120.1 kg)  10/05/17 263 lb (119.3 kg)    BP 108/76   Pulse (!) 127   Temp 99 F (37.2 C)   Ht 5' 10.5" (1.791 m)   Wt 263 lb 8 oz (119.5 kg)   SpO2 100%   BMI 37.27 kg/m    Physical Exam Constitutional:      General: He is not in acute distress.    Appearance: He is well-developed. He is not ill-appearing.  HENT:     Head: Normocephalic and  atraumatic.     Right Ear: Tympanic membrane and ear canal normal.     Left Ear: Tympanic membrane and ear canal normal.     Nose: Mucosal edema and rhinorrhea present.     Right Sinus: No maxillary sinus tenderness or frontal sinus tenderness.     Left Sinus: No maxillary sinus tenderness or frontal sinus tenderness.     Mouth/Throat:     Pharynx: Uvula midline. Posterior oropharyngeal erythema present. No oropharyngeal exudate.     Tonsils: Swelling: 0 on the right. 0 on the left.  Neck:     Musculoskeletal: Neck supple.  Cardiovascular:     Rate and Rhythm: Regular rhythm. Tachycardia present.     Heart sounds: No murmur.  Pulmonary:     Effort: Pulmonary effort is normal. No respiratory distress.     Breath sounds: Normal breath sounds. No wheezing or rales.  Lymphadenopathy:     Cervical: No cervical adenopathy.  Skin:    General: Skin is warm and dry.     Capillary Refill: Capillary refill takes less than 2 seconds.  Neurological:     Mental Status: He is alert.      Rapid flu Positive for A     Assessment & Plan:   Problem List Items Addressed This Visit    None    Visit Diagnoses  Fever, unspecified fever cause    -  Primary   Relevant Medications   oseltamivir (TAMIFLU) 75 MG capsule   Other Relevant Orders   POCT Influenza A/B (Completed)   Influenza A       Relevant Medications   oseltamivir (TAMIFLU) 75 MG capsule     Flu positive for A. Will do Tamiflu as symptoms x 36 hours currently Work note provided Advised calling if family becomes sick.   Return if symptoms worsen or fail to improve.  Lesleigh Noe, MD

## 2018-03-13 ENCOUNTER — Encounter: Payer: Self-pay | Admitting: Family Medicine

## 2018-03-13 NOTE — Telephone Encounter (Signed)
Pt asking to get sons in to establish care with me.  Ok to do. Thanks

## 2018-04-22 ENCOUNTER — Other Ambulatory Visit: Payer: Self-pay | Admitting: Family Medicine

## 2018-04-22 DIAGNOSIS — J302 Other seasonal allergic rhinitis: Secondary | ICD-10-CM

## 2018-05-06 ENCOUNTER — Encounter: Payer: Self-pay | Admitting: Family Medicine

## 2018-05-06 DIAGNOSIS — K649 Unspecified hemorrhoids: Secondary | ICD-10-CM

## 2018-05-13 ENCOUNTER — Encounter: Payer: Self-pay | Admitting: Surgery

## 2018-05-13 ENCOUNTER — Other Ambulatory Visit: Payer: Self-pay

## 2018-05-13 ENCOUNTER — Ambulatory Visit (INDEPENDENT_AMBULATORY_CARE_PROVIDER_SITE_OTHER): Payer: BLUE CROSS/BLUE SHIELD | Admitting: Surgery

## 2018-05-13 VITALS — BP 121/85 | HR 96 | Temp 98.1°F | Resp 16 | Ht 73.0 in | Wt 206.0 lb

## 2018-05-13 DIAGNOSIS — K642 Third degree hemorrhoids: Secondary | ICD-10-CM | POA: Diagnosis not present

## 2018-05-13 NOTE — Progress Notes (Signed)
Patient ID: Bruce Wang, male   DOB: July 06, 1965, 53 y.o.   MRN: [id number]  HPI Bruce Wang is a 53 y.o. male seen in consultation at the request of Dr. Danise Mina for internal hemorrhoids.  Reports that he has some symptoms of pressure and discomfort the anorectal area.  It is worse after bowel movements.  No fevers no chills no hematochezia he had a recent colonoscopy showing no evidence of any major pathology.  He did have 2 hemorrhoidal banding about 8 years ago.  He has not tried any medical therapy for his hemorrhoid.  No fevers no chills.  Did have a flu about 2 months ago or so. Did have gastric bypass several years ago and is doing well.  HPI  Past Medical History:  Diagnosis Date  . Dyspnea   . 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 Gout   . History of chicken pox     Past Surgical History:  Procedure Laterality Date  . COLONOSCOPY WITH PROPOFOL N/A 07/20/2016   1 TA, rpt 5 yrs Allen Norris, Darren, MD)  . INGUINAL HERNIA REPAIR  1990s  . POLYPECTOMY  07/20/2016   Procedure: POLYPECTOMY;  Surgeon: Lucilla Lame, MD;  Location: [facility];  Service: Endoscopy;;  . ROUX-EN-Y PROCEDURE  2000    Family History  Problem Relation Age of Onset  . Hypertension Mother   . Hypertension Sister   . Cancer Paternal Uncle 4       colon cancer, prostate cancer  . Dementia Maternal Grandmother   . Parkinsonism Maternal Grandfather   . Parkinson's disease Maternal Grandfather   . Macular degeneration Father   . Macular degeneration Paternal Grandmother   . Coronary artery disease Neg Hx   . Stroke Neg Hx   . Diabetes Neg Hx     Social History Social History   Tobacco Use  . Smoking status: Former Smoker    Last attempt to quit: 01/09/2010    Years since quitting: 8.3  . Smokeless tobacco: Never Used  Substance Use Topics  . Alcohol use: Yes    Alcohol/week: 21.0 standard drinks    Types: 21 Cans of beer per week    Comment: Regular on weekends  . Drug  use: No    No Known Allergies  Current Outpatient Medications  Medication Sig Dispense Refill  . allopurinol (ZYLOPRIM) 300 MG tablet TAKE 1 TABLET BY MOUTH DAILY 90 tablet 1  . cetirizine (ZYRTEC) 10 MG tablet Take 10 mg by mouth daily.    . Cholecalciferol (VITAMIN D) 2000 units CAPS Take 1 capsule by mouth daily.    . ferrous sulfate (SLOW IRON) 160 (50 Fe) MG TBCR SR tablet Take 1 tablet (160 mg total) by mouth every Monday, Wednesday, and Friday. 45 each 3  . fluticasone (FLONASE) 50 MCG/ACT nasal spray INSTILL 2 SPRAYS INTO BOTH NOSTRILS DAILY 78 g 1  . tadalafil (ADCIRCA/CIALIS) 20 MG tablet Take 1 tablet (20 mg total) by mouth every other day as needed for erectile dysfunction. 12 tablet 11  . vitamin B-12 (V-R VITAMIN B-12) 500 MCG tablet Take 1 tablet (500 mcg total) by mouth every Monday, Wednesday, and Friday.    . vitamin C (ASCORBIC ACID) 500 MG tablet Take 500 mg by mouth daily.     No current facility-administered medications for this visit.      Review of Systems Full ROS  was asked and was negative except for the information on the  HPI  Physical Exam Blood pressure 121/85, pulse 96, temperature 98.1 F (36.7 C), temperature source Temporal, resp. rate 16, height 6\' 1"  (1.854 m), weight 206 lb (93.4 kg), SpO2 98 %. CONSTITUTIONAL: NAD EYES: Pupils are equal, round, and reactive to light, Sclera are non-icteric. EARS, NOSE, MOUTH AND THROAT: The oropharynx is clear. The oral mucosa is pink and moist. Hearing is intact to voice. LYMPH NODES:  Lymph nodes in the neck are normal. RESPIRATORY:  Lungs are clear. There is normal respiratory effort, with equal breath sounds bilaterally, and without pathologic use of accessory muscles. CARDIOVASCULAR: Heart is regular without murmurs, gallops, or rubs. GI: The abdomen is  soft, nontender, and nondistended. There are no palpable masses. There is no hepatosplenomegaly. There are normal bowel sounds in all quadrants. Rectal:  Evidence of left and right posterolateral internal hemorrhoids grade 3.  No evidence of anorectal masses. MUSCULOSKELETAL: Normal muscle strength and tone. No cyanosis or edema.   SKIN: Turgor is good and there are no pathologic skin lesions or ulcers. NEUROLOGIC: Motor and sensation is grossly normal. Cranial nerves are grossly intact. PSYCH:  Oriented to person, place and time. Affect is normal.  Data Reviewed  I have personally reviewed the patient's imaging, laboratory findings and medical records.    Assessment/Plan Grade III internal hemorrhoids.  At this time there is no evidence of thrombosis or incarceration.  I do think that he will benefit from aggressive medical therapy in the form of sitz baths, fiber and Vaseline ointment.  I encouraged him against any use of steroids.  Discussed with patient detail about diet modification as well.  I will see him back in a few weeks and if at that time he still persist we may consider hemorrhoidectomy.  He is in agreement with our plan.  A copy of this report was sent to the referring provider    Caroleen Hamman, MD FACS General Surgeon 05/13/2018, 3:41 PM

## 2018-05-13 NOTE — Patient Instructions (Addendum)
Use vasaline daily if needed. Do not use preparation H.     Psyllium Chewable bars or wafers What is this medicine? PSYLLIUM (SIL i yum) is a bulk-forming fiber laxative. This medicine is used to treat constipation. Increasing fiber in the diet may also help lower cholesterol and promote heart health for some people. This medicine may be used for other purposes; ask your health care provider or pharmacist if you have questions. COMMON BRAND NAME(S): Metamucil What should I tell my health care provider before I take this medicine? They need to know if you have any of these conditions: -blockage in your bowel -difficulty swallowing -inflammatory bowel disease -stomach or intestine problems -sudden change in bowel habits lasting more than 2 weeks -an unusual or allergic reaction to psyllium, other medicines, dyes, or preservatives -pregnant or trying or get pregnant -breast-feeding How should I use this medicine? Take this medicine by mouth with a full glass of water. Take this medicine by mouth. Chew it completely before swallowing. Follow the directions on the package labeling, or take as directed by your health care professional. Take your medicine at regular intervals. Do not take your medicine more often than directed. Talk to your pediatrician regarding the use of this medicine in children. While this drug may be prescribed for children as young as 41 years of age for selected conditions, precautions do apply. Overdosage: If you think you have taken too much of this medicine contact a poison control center or emergency room at once. NOTE: This medicine is only for you. Do not share this medicine with others. What if I miss a dose? If you miss a dose, take it as soon as you can. If it is almost time for your next dose, take only that dose. Do not take double or extra doses. What may interact with this medicine? Interactions are not expected. Take this product at least 2 hours before or  after other medicines. This list may not describe all possible interactions. Give your health care provider a list of all the medicines, herbs, non-prescription drugs, or dietary supplements you use. Also tell them if you smoke, drink alcohol, or use illegal drugs. Some items may interact with your medicine. What should I watch for while using this medicine? Check with your doctor or health care professional if your symptoms do not start to get better or if they get worse. Stop using this medicine and contact your doctor or health care professional if you have rectal bleeding or if you have to treat your constipation for more than 1 week. These could be signs of a more serious condition. Drink several glasses of water a day while you are taking this medicine. This will help to relieve constipation and prevent dehydration. What side effects may I notice from receiving this medicine? Side effects that you should report to your doctor or health care professional as soon as possible: -allergic reactions like skin rash, itching or hives, swelling of the face, lips, or tongue -breathing problems -chest pain -nausea, vomiting -rectal bleeding -trouble swallowing Side effects that usually do not require medical attention (report to your doctor or health care professional if they continue or are bothersome): -bloating -gas -stomach cramps This list may not describe all possible side effects. Call your doctor for medical advice about side effects. You may report side effects to FDA at 1-800-FDA-1088. Where should I keep my medicine? Keep out of the reach of children. Store at room temperature between 15 and 30 degrees C (  59 and 86 degrees F). Protect from moisture. Throw away any unused medicine after the expiration date. NOTE: This sheet is a summary. It may not cover all possible information. If you have questions about this medicine, talk to your doctor, pharmacist, or health care provider.  2019  Elsevier/Gold Standard (2017-05-22 14:56:45) Fiber Content in Foods  See the following list for the dietary fiber content of some common foods. High-fiber foods High-fiber foods contain 4 grams or more (4g or more) of fiber per serving. They include:  Artichoke (fresh) - 1 medium has 10.3g of fiber.  Baked beans, plain or vegetarian (canned) -  cup has 5.2g of fiber.  Blackberries or raspberries (fresh) -  cup has 4g of fiber.  Bran cereal -  cup has 8.6g of fiber.  Bulgur (cooked) -  cup has 4g of fiber.  Kidney beans (canned) -  cup has 6.8g of fiber.  Lentils (cooked) -  cup has 7.8g of fiber.  Pear (fresh) - 1 medium has 5.1g of fiber.  Peas (frozen) -  cup has 4.4g of fiber.  Pinto beans (canned) -  cup has 5.5g of fiber.  Pinto beans (dried and cooked) -  cup has 7.7g of fiber.  Potato with skin (baked) - 1 medium has 4.4g of fiber.  Quinoa (cooked) -  cup has 5g of fiber.  Soybeans (canned, frozen, or fresh) -  cup has 5.1g of fiber. Moderate-fiber foods Moderate-fiber foods contain 1-4 grams (1-4g) of fiber per serving. They include:  Almonds - 1 oz. has 3.5g of fiber.  Apple with skin - 1 medium has 3.3g of fiber.  Applesauce, sweetened -  cup has 1.5g of fiber.  Bagel, plain - one 4-inch (10-cm) bagel has 2g of fiber.  Banana - 1 medium has 3.1g of fiber.  Broccoli (cooked) -  cup has 2.5g of fiber.  Carrots (cooked) -  cup has 2.3g of fiber.  Corn (canned or frozen) -  cup has 2.1g of fiber.  Corn tortilla - one 6-inch (15-cm) tortilla has 1.5g of fiber.  Green beans (canned) -  cup has 2g of fiber.  Instant oatmeal -  cup has about 2g of fiber.  Long-grain brown rice (cooked) - 1 cup has 3.5g of fiber.  Macaroni, enriched (cooked) - 1 cup has 2.5g of fiber.  Melon - 1 cup has 1.4g of fiber.  Multigrain cereal -  cup has about 2-4g of fiber.  Orange - 1 small has 3.1g of fiber.  Potatoes, mashed -  cup has 1.6g of  fiber.  Raisins - 1/4 cup has 1.6g of fiber.  Squash -  cup has 2.9g of fiber.  Sunflower seeds -  cup has 1.1g of fiber.  Tomato - 1 medium has 1.5g of fiber.  Vegetable or soy patty - 1 has 3.4g of fiber.  Whole-wheat bread - 1 slice has 2g of fiber.  Whole-wheat spaghetti -  cup has 3.2g of fiber. Low-fiber foods Low-fiber foods contain less than 1 gram (less than 1g) of fiber per serving. They include:  Egg - 1 large.  Flour tortilla - one 6-inch (15-cm) tortilla.  Fruit juice -  cup.  Lettuce - 1 cup.  Meat, poultry, or fish - 1 oz.  Milk - 1 cup.  Spinach (raw) - 1 cup.  White bread - 1 slice.  White rice -  cup.  Yogurt -  cup. Actual amounts of fiber in foods may be different depending on processing. Talk with your  dietitian about how much fiber you need in your diet. This information is not intended to replace advice given to you by your health care provider. Make sure you discuss any questions you have with your health care provider. Document Released: 05/14/2006 Document Revised: 06/03/2015 Document Reviewed: 02/18/2015 Elsevier Interactive Patient Education  2019 [place] A disposable sitz bath is a plastic basin that fits over the toilet. A bag is hung above the toilet, and the bag is connected to a tube that opens into the basin. The bag is filled with warm water that flows into the basin through the tube. A sitz bath can be used to help relieve symptoms, clean, and promote healing in the genital and anal areas, as well as in the lower abdomen and buttocks. What are the risks? Sitz baths are generally very safe. It is possible for the skin between the genitals and the anus (perineum) to become infected, but this is rare. You can avoid this by cleaning your sitz bath supplies thoroughly. How to use a disposable sitz bath 1. Close the clamp on the tube. Make sure the clamp is closed tightly to prevent leakage. 2. Fill  the sitz bath basin and the plastic bag with warm water. The water should be warm enough to be comfortable, but not hot. 3. Raise the toilet seat and place the filled basin on the toilet. Make sure the overflow opening is facing toward the back of the toilet. ? If you prefer, you may place the empty basin on the toilet first, and then use the plastic bag to fill the basin with warm water. 4. Hang the filled plastic bag overhead on a hook or towel rack close to the toilet. The bag should be higher than the toilet so that the water will flow down through the tube. 5. Attach the tube to the opening on the basin. Make sure that the tube is attached to the basin tightly to prevent leakage. 6. Sit on the basin and release the clamp. This will allow warm water to flow into the basin and flush the area around your genitals and anus. 7. Remain sitting on the basin for about 15-20 minutes, or as long as told by your health care provider. 8. Stand up and gently pat your skin dry. If directed, apply clean bandages (dressings) to the affected area as told by your health care provider. 9. Carefully remove the basin from the toilet seat and tip the basin into the toilet to empty any remaining water. Empty any remaining water from the plastic bag into the toilet. Then, flush the toilet. 10. Wash the basin with warm water and soap. Let the basin air dry in the sink. You should also let the plastic bag and the tubing air dry. 11. Store the basin, tubing, and plastic bag in a clean, dry area. 12. Wash your hands with soap and water. If soap and water are not available, use hand sanitizer. Contact a health care provider if:  You have symptoms that get worse instead of better.  You develop new skin irritation, redness, or swelling around your genitals or anus. This information is not intended to replace advice given to you by your health care provider. Make sure you discuss any questions you have with your health care  provider. Document Released: 06/27/2011 Document Revised: 06/03/2015 Document Reviewed: 11/15/2014 Elsevier Interactive Patient Education  2018 [employer].   How to Take a [employer] trader A sitz bath  is a warm water bath that is taken while you are sitting down. The water should only come up to your hips and should cover your buttocks. Your health care provider may recommend a sitz bath to help you:  Clean the lower part of your body, including your genital area.  With itching.  With pain.  With sore muscles or muscles that tighten or spasm.  How to take a sitz bath Take 3-4 sitz baths per day or as told by your health care provider. 1. Partially fill a bathtub with warm water. You will only need the water to be deep enough to cover your hips and buttocks when you are sitting in it. 2. If your health care provider told you to put medicine in the water, follow the directions exactly. 3. Sit in the water and open the tub drain a little. 4. Turn on the warm water again to keep the tub at the correct level. Keep the water running constantly. 5. Soak in the water for 15-20 minutes or as told by your health care provider. 6. After the sitz bath, pat the affected area dry first. Do not rub it. 7. Be careful when you stand up after the sitz bath because you may feel dizzy.  Contact a health care provider if:  Your symptoms get worse. Do not continue with sitz baths if your symptoms get worse.  You have new symptoms. Do not continue with sitz baths until you talk with your health care provider. This information is not intended to replace advice given to you by your health care provider. Make sure you discuss any questions you have with your health care provider. Document Released: 09/18/2003 Document Revised: 05/26/2015 Document Reviewed: 12/24/2013 Elsevier Interactive Patient Education  2018 [employer].        Hemorrhoids Hemorrhoids are swollen veins that may develop:  In the  butt (rectum). These are called internal hemorrhoids.  Around the opening of the butt (anus). These are called external hemorrhoids. Hemorrhoids can cause pain, itching, or bleeding. Most of the time, they do not cause serious problems. They usually get better with diet changes, lifestyle changes, and other home treatments. What are the causes? This condition may be caused by:  Having trouble pooping (constipation).  Pushing hard (straining) to poop.  Watery poop (diarrhea).  Pregnancy.  Being very overweight (obese).  Sitting for long periods of time.  Heavy lifting or other activity that causes you to strain.  Anal sex.  Riding a bike for a long period of time. What are the signs or symptoms? Symptoms of this condition include:  Pain.  Itching or soreness in the butt.  Bleeding from the butt.  Leaking poop.  Swelling in the area.  One or more lumps around the opening of your butt. How is this diagnosed? A doctor can often diagnose this condition by looking at the affected area. The doctor may also:  Do an exam that involves feeling the area with a gloved hand (digital rectal exam).  Examine the area inside your butt using a small tube (anoscope).  Order blood tests. This may be done if you have lost a lot of blood.  Have you get a test that involves looking inside the colon using a flexible tube with a camera on the end (sigmoidoscopy or colonoscopy). How is this treated? This condition can usually be treated at home. Your doctor may tell you to change what you eat, make lifestyle changes, or try home treatments. If these  do not help, procedures can be done to remove the hemorrhoids or make them smaller. These may involve:  Placing rubber bands at the base of the hemorrhoids to cut off their blood supply.  Injecting medicine into the hemorrhoids to shrink them.  Shining a type of light energy onto the hemorrhoids to cause them to fall off.  Doing surgery to  remove the hemorrhoids or cut off their blood supply. Follow these instructions at home: Eating and drinking   Eat foods that have a lot of fiber in them. These include whole grains, beans, nuts, fruits, and vegetables.  Ask your doctor about taking products that have added fiber (fibersupplements).  Reduce the amount of fat in your diet. You can do this by: ? Eating low-fat dairy products. ? Eating less red meat. ? Avoiding processed foods.  Drink enough fluid to keep your pee (urine) pale yellow. Managing pain and swelling   Take a warm-water bath (sitz bath) for 20 minutes to ease pain. Do this 3-4 times a day. You may do this in a bathtub or using a portable sitz bath that fits over the toilet.  If told, put ice on the painful area. It may be helpful to use ice between your warm baths. ? Put ice in a plastic bag. ? Place a towel between your skin and the bag. ? Leave the ice on for 20 minutes, 2-3 times a day. General instructions  Take over-the-counter and prescription medicines only as told by your doctor. ? Medicated creams and medicines may be used as told.  Exercise often. Ask your doctor how much and what kind of exercise is best for you.  Go to the bathroom when you have the urge to poop. Do not wait.  Avoid pushing too hard when you poop.  Keep your butt dry and clean. Use wet toilet paper or moist towelettes after pooping.  Do not sit on the toilet for a long time.  Keep all follow-up visits as told by your doctor. This is important. Contact a doctor if you:  Have pain and swelling that do not get better with treatment or medicine.  Have trouble pooping.  Cannot poop.  Have pain or swelling outside the area of the hemorrhoids. Get help right away if you have:  Bleeding that will not stop. Summary  Hemorrhoids are swollen veins in the butt or around the opening of the butt.  They can cause pain, itching, or bleeding.  Eat foods that have a lot of  fiber in them. These include whole grains, beans, nuts, fruits, and vegetables.  Take a warm-water bath (sitz bath) for 20 minutes to ease pain. Do this 3-4 times a day. This information is not intended to replace advice given to you by your health care provider. Make sure you discuss any questions you have with your health care provider. Document Released: 10/05/2007 Document Revised: 05/17/2017 Document Reviewed: 05/17/2017 Elsevier Interactive Patient Education  2019 [employer].

## 2018-06-17 ENCOUNTER — Encounter: Payer: Self-pay | Admitting: Surgery

## 2018-06-17 ENCOUNTER — Other Ambulatory Visit: Payer: Self-pay

## 2018-06-17 ENCOUNTER — Ambulatory Visit (INDEPENDENT_AMBULATORY_CARE_PROVIDER_SITE_OTHER): Payer: BC Managed Care – PPO | Admitting: Surgery

## 2018-06-17 VITALS — BP 132/74 | HR 72 | Temp 97.7°F | Ht 72.0 in | Wt 267.0 lb

## 2018-06-17 DIAGNOSIS — K642 Third degree hemorrhoids: Secondary | ICD-10-CM | POA: Diagnosis not present

## 2018-06-17 NOTE — H&P (View-Only) (Signed)
Outpatient Surgical Follow Up  06/17/2018  Bruce Wang is an 53 y.o. male.   Chief Complaint  Patient presents with  . Follow-up    hemorrhoids    HPI: Bruce Wang is following up for internal hemorrhoids.  The continue to be symptomatic.  They do itch and the sputum intermittent sharp pains.  There is some occasional blood when he wipes.  No fevers no chills.  He has done sitz bath's, Vaseline, high-fiber diet and stool softener and that has not provided any relief at all. He is able to perform more than 4 METS of activity without any shortness of breath or chest pain  Past Medical History:  Diagnosis Date  . Dyspnea   . 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 Gout   . History of chicken pox     Past Surgical History:  Procedure Laterality Date  . COLONOSCOPY WITH PROPOFOL N/A 07/20/2016   1 TA, rpt 5 yrs Allen Norris, Darren, MD)  . INGUINAL HERNIA REPAIR  1990s  . POLYPECTOMY  07/20/2016   Procedure: POLYPECTOMY;  Surgeon: Lucilla Lame, MD;  Location: [facility];  Service: Endoscopy;;  . ROUX-EN-Y PROCEDURE  2000    Family History  Problem Relation Age of Onset  . Hypertension Mother   . Hypertension Sister   . Cancer Paternal Uncle 13       colon cancer, prostate cancer  . Dementia Maternal Grandmother   . Parkinsonism Maternal Grandfather   . Parkinson's disease Maternal Grandfather   . Macular degeneration Father   . Macular degeneration Paternal Grandmother   . Coronary artery disease Neg Hx   . Stroke Neg Hx   . Diabetes Neg Hx     Social History:  reports that he quit smoking about 8 years ago. He has never used smokeless tobacco. He reports current alcohol use of about 21.0 standard drinks of alcohol per week. He reports that he does not use drugs.  Allergies: No Known Allergies  Medications reviewed.    ROS Full ROS performed and is otherwise negative other than what is stated in HPI   BP 132/74   Pulse 72   Temp 97.7 F (36.5 C)  (Skin)   Ht 6' (1.829 m)   Wt 267 lb (121.1 kg)   SpO2 98%   BMI 36.21 kg/m   Physical Exam Vitals signs and nursing note reviewed. Exam conducted with a chaperone present.  Constitutional:      General: He is not in acute distress.    Appearance: Normal appearance. He is normal weight.  Eyes:     General: No scleral icterus.       Right eye: No discharge.        Left eye: No discharge.  Neck:     Musculoskeletal: Normal range of motion.  Cardiovascular:     Rate and Rhythm: Normal rate and regular rhythm.     Pulses: Normal pulses.  Pulmonary:     Effort: Pulmonary effort is normal. No respiratory distress.     Breath sounds: Normal breath sounds. No stridor.  Abdominal:     General: Abdomen is flat. There is no distension.     Palpations: There is no mass.     Tenderness: There is no abdominal tenderness. There is no guarding or rebound.     Hernia: No hernia is present.  Genitourinary:    Comments: Grade III left posterior lateral hemorrhoid and grade III right anterior lateral  hemorrhoid Skin:    General: Skin is warm and dry.     Capillary Refill: Capillary refill takes less than 2 seconds.  Neurological:     General: No focal deficit present.     Mental Status: He is alert and oriented to person, place, and time.  Psychiatric:        Mood and Affect: Mood normal.        Behavior: Behavior normal.        Thought Content: Thought content normal.        Judgment: Judgment normal.         Assessment/Plan: Grade 3 internal hemorrhoids.  Discussed with patient detail options of medical management versus excision.  Given that he has not felt any relief after initiation of medical management he would like to proceed with hemorrhoidectomy.  Procedure discussed with patient detail.  Risk, benefits and possible occasions including but not limited to: Bleeding, infection, recurrence, stenosis and chronic pain.  He understands and wishes to proceed  Greater than 50% of  the 25 minutes  visit was spent in counseling/coordination of care   Caroleen Hamman, MD [facility]

## 2018-06-17 NOTE — Patient Instructions (Signed)

## 2018-06-17 NOTE — Progress Notes (Signed)
Outpatient Surgical Follow Up  06/17/2018  Bruce Wang is an 53 y.o. male.   Chief Complaint  Patient presents with  . Follow-up    hemorrhoids    HPI: Bruce Wang is following up for internal hemorrhoids.  The continue to be symptomatic.  They do itch and the sputum intermittent sharp pains.  There is some occasional blood when he wipes.  No fevers no chills.  He has done sitz bath's, Vaseline, high-fiber diet and stool softener and that has not provided any relief at all. He is able to perform more than 4 METS of activity without any shortness of breath or chest pain  Past Medical History:  Diagnosis Date  . Dyspnea   . 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 Gout   . History of chicken pox     Past Surgical History:  Procedure Laterality Date  . COLONOSCOPY WITH PROPOFOL N/A 07/20/2016   1 TA, rpt 5 yrs Bruce Wang, Darren, MD)  . INGUINAL HERNIA REPAIR  1990s  . POLYPECTOMY  07/20/2016   Procedure: POLYPECTOMY;  Surgeon: Bruce Lame, MD;  Location: [facility];  Service: Endoscopy;;  . ROUX-EN-Y PROCEDURE  2000    Family History  Problem Relation Age of Onset  . Hypertension Mother   . Hypertension Sister   . Cancer Paternal Uncle 39       colon cancer, prostate cancer  . Dementia Maternal Grandmother   . Parkinsonism Maternal Grandfather   . Parkinson's disease Maternal Grandfather   . Macular degeneration Father   . Macular degeneration Paternal Grandmother   . Coronary artery disease Neg Hx   . Stroke Neg Hx   . Diabetes Neg Hx     Social History:  reports that he quit smoking about 8 years ago. He has never used smokeless tobacco. He reports current alcohol use of about 21.0 standard drinks of alcohol per week. He reports that he does not use drugs.  Allergies: No Known Allergies  Medications reviewed.    ROS Full ROS performed and is otherwise negative other than what is stated in HPI   BP 132/74   Pulse 72   Temp 97.7 F (36.5 C)  (Skin)   Ht 6' (1.829 m)   Wt 267 lb (121.1 kg)   SpO2 98%   BMI 36.21 kg/m   Physical Exam Vitals signs and nursing note reviewed. Exam conducted with a chaperone present.  Constitutional:      General: He is not in acute distress.    Appearance: Normal appearance. He is normal weight.  Eyes:     General: No scleral icterus.       Right eye: No discharge.        Left eye: No discharge.  Neck:     Musculoskeletal: Normal range of motion.  Cardiovascular:     Rate and Rhythm: Normal rate and regular rhythm.     Pulses: Normal pulses.  Pulmonary:     Effort: Pulmonary effort is normal. No respiratory distress.     Breath sounds: Normal breath sounds. No stridor.  Abdominal:     General: Abdomen is flat. There is no distension.     Palpations: There is no mass.     Tenderness: There is no abdominal tenderness. There is no guarding or rebound.     Hernia: No hernia is present.  Genitourinary:    Comments: Grade III left posterior lateral hemorrhoid and grade III right anterior lateral  hemorrhoid Skin:    General: Skin is warm and dry.     Capillary Refill: Capillary refill takes less than 2 seconds.  Neurological:     General: No focal deficit present.     Mental Status: He is alert and oriented to person, place, and time.  Psychiatric:        Mood and Affect: Mood normal.        Behavior: Behavior normal.        Thought Content: Thought content normal.        Judgment: Judgment normal.         Assessment/Plan: Grade 3 internal hemorrhoids.  Discussed with patient detail options of medical management versus excision.  Given that he has not felt any relief after initiation of medical management he would like to proceed with hemorrhoidectomy.  Procedure discussed with patient detail.  Risk, benefits and possible occasions including but not limited to: Bleeding, infection, recurrence, stenosis and chronic pain.  He understands and wishes to proceed  Greater than 50% of  the 25 minutes  visit was spent in counseling/coordination of care   Caroleen Hamman, MD [facility]

## 2018-06-18 ENCOUNTER — Telehealth: Payer: Self-pay | Admitting: *Deleted

## 2018-06-18 NOTE — Telephone Encounter (Signed)
Patient's surgery to be scheduled for 07-02-18 at [facility] with Dr. Dahlia Byes.  The patient is aware to have COVID-19 testing done on 06-28-18 at the [facility] drive thru ([address] [place]) between 10:30 am and 12:30 pm. He is aware to isolate after, have no visitors, wash hands frequently, and avoid touching face.   The patient is aware he will be contacted by the [place] to complete a phone interview sometime in the near future.  Patient aware to be NPO after midnight and have a driver.   He is aware to check in at the [place] entrance where he will be screened for the coronavirus and then sent to Same Day Surgery.   Patient aware that he may have no visitors and driver will need to wait in the car due to COVID-19 restrictions.   The patient verbalizes understanding of the above.   The patient is aware to call the office should he have further questions.

## 2018-06-26 ENCOUNTER — Encounter
Admission: RE | Admit: 2018-06-26 | Discharge: 2018-06-26 | Disposition: A | Discharge status: Home / Self Care | Payer: BC Managed Care – PPO | Source: Ambulatory Visit | Attending: Surgery | Admitting: Surgery

## 2018-06-26 ENCOUNTER — Other Ambulatory Visit: Payer: Self-pay

## 2018-06-26 HISTORY — DX: Anemia, unspecified: D64.9

## 2018-06-26 NOTE — Patient Instructions (Signed)
Your procedure is scheduled on: 07-02-18 TUESDAY Report to Same Day Surgery 2nd floor medical mall [facility] Entrance-take elevator on left to 2nd floor.  Check in with surgery information desk.) To find out your arrival time please call [phone] between 1PM - 3PM on 07-01-18 MONDAY  Remember: Instructions that are not followed completely may result in serious medical risk, up to and including death, or upon the discretion of your surgeon and anesthesiologist your surgery may need to be rescheduled.    _x___ 1. Do not eat food after midnight the night before your procedure. NO GUM OR CANDY AFTER MIDNIGHT. You may drink clear liquids up to 2 hours before you are scheduled to arrive at the hospital for your procedure.  Do not drink clear liquids within 2 hours of your scheduled arrival to the hospital.  Clear liquids include  --Water or Apple juice without pulp  --Clear carbohydrate beverage such as ClearFast or Gatorade  --Black Coffee or Clear Tea (No milk, no creamers, do not add anything to the coffee or Tea   ____Ensure clear carbohydrate drink on the way to the hospital for bariatric patients  ____Ensure clear carbohydrate drink 3 hours before surgery for Dr Dwyane Luo patients if physician instructed.    __x__ 2. No Alcohol for 24 hours before or after surgery.   __x__3. No Smoking or e-cigarettes for 24 prior to surgery.  Do not use any chewable tobacco products for at least 6 hour prior to surgery   ____  4. Bring all medications with you on the day of surgery if instructed.    __x__ 5. Notify your doctor if there is any change in your medical condition     (cold, fever, infections).    x___6. On the morning of surgery brush your teeth with toothpaste and water.  You may rinse your mouth with mouth wash if you wish.  Do not swallow any toothpaste or mouthwash.   Do not wear jewelry, make-up, hairpins, clips or nail polish.  Do not wear lotions, powders, or perfumes. You  may wear deodorant.  Do not shave 48 hours prior to surgery. Men may shave face and neck.  Do not bring valuables to the hospital.    [facility] is not responsible for any belongings or valuables.               Contacts, dentures or bridgework may not be worn into surgery.  Leave your suitcase in the car. After surgery it may be brought to your room.  For patients admitted to the hospital, discharge time is determined by your treatment team.  _  Patients discharged the day of surgery will not be allowed to drive home.  You will need someone to drive you home and stay with you the night of your procedure.    Please read over the following fact sheets that you were given:   [facility] Preparing for Surgery   ____ Take anti-hypertensive listed below, cardiac, seizure, asthma, anti-reflux and psychiatric medicines. These include:  1. NONE  2.  3.  4.  5.  6.  _X___Fleets enema as directed-DO FLEET ENEMA AT HOME THE NIGHT BEFORE YOUR SURGERY AND THE OTHER ENEMA THE MORNING OF SURGERY, 1 HOUR PRIOR TO YOUR ARRIVAL TIME TO HOSPITAL  ____ Use CHG Soap or sage wipes as directed on instruction sheet   ____ Use inhalers on the day of surgery and bring to hospital day of surgery  ____ Stop Metformin and Janumet 2  days prior to surgery.    ____ Take 1/2 of usual insulin dose the night before surgery and none on the morning surgery.   ____ Follow recommendations from Cardiologist, Pulmonologist or PCP regarding stopping Aspirin, Coumadin, Plavix ,Eliquis, Effient, or Pradaxa, and Pletal.  X____Stop Anti-inflammatories such as Advil, Aleve, Ibuprofen, Motrin, Naproxen, Naprosyn, Goodies powders or aspirin products NOW- OK to take Tylenol    ____ Stop supplements until after surgery.     ____ Bring C-Pap to the hospital.

## 2018-06-28 ENCOUNTER — Other Ambulatory Visit: Payer: Self-pay

## 2018-06-28 ENCOUNTER — Other Ambulatory Visit
Admission: RE | Admit: 2018-06-28 | Discharge: 2018-06-28 | Disposition: A | Discharge status: Home / Self Care | Payer: BC Managed Care – PPO | Source: Ambulatory Visit | Attending: Surgery | Admitting: Surgery

## 2018-06-28 DIAGNOSIS — Z1159 Encounter for screening for other viral diseases: Secondary | ICD-10-CM | POA: Diagnosis not present

## 2018-06-28 DIAGNOSIS — Z01812 Encounter for preprocedural laboratory examination: Secondary | ICD-10-CM | POA: Diagnosis not present

## 2018-06-28 LAB — HEMOGLOBIN: Hemoglobin: 14.8 g/dL (ref 13.0–17.0)

## 2018-06-29 LAB — NOVEL CORONAVIRUS, NAA (HOSP ORDER, SEND-OUT TO REF LAB; TAT 18-24 HRS): SARS-CoV-2, NAA: NOT DETECTED

## 2018-07-01 ENCOUNTER — Telehealth: Payer: Self-pay

## 2018-07-01 NOTE — Telephone Encounter (Signed)
Patient called office asking if we could prescribe medication to calm his anxiety tonight due to scheduled surgery tomorrow.  I let him know as soon as he arrives tomorrow for his surgery the nurses will start an IV and he can request something at that time.

## 2018-07-02 ENCOUNTER — Ambulatory Visit
Admission: RE | Admit: 2018-07-02 | Discharge: 2018-07-02 | Disposition: A | Discharge status: Home / Self Care | Payer: BC Managed Care – PPO | Source: Ambulatory Visit | Attending: Surgery | Admitting: Surgery

## 2018-07-02 ENCOUNTER — Ambulatory Visit: Payer: BC Managed Care – PPO | Admitting: Anesthesiology

## 2018-07-02 ENCOUNTER — Encounter
Admission: RE | Disposition: A | Discharge status: Home / Self Care | Payer: Self-pay | Source: Ambulatory Visit | Attending: Surgery

## 2018-07-02 ENCOUNTER — Other Ambulatory Visit: Payer: Self-pay

## 2018-07-02 DIAGNOSIS — D649 Anemia, unspecified: Secondary | ICD-10-CM | POA: Insufficient documentation

## 2018-07-02 DIAGNOSIS — Z9884 Bariatric surgery status: Secondary | ICD-10-CM | POA: Diagnosis not present

## 2018-07-02 DIAGNOSIS — K642 Third degree hemorrhoids: Secondary | ICD-10-CM | POA: Insufficient documentation

## 2018-07-02 DIAGNOSIS — Z79899 Other long term (current) drug therapy: Secondary | ICD-10-CM | POA: Diagnosis not present

## 2018-07-02 DIAGNOSIS — Z87891 Personal history of nicotine dependence: Secondary | ICD-10-CM | POA: Diagnosis not present

## 2018-07-02 DIAGNOSIS — K649 Unspecified hemorrhoids: Secondary | ICD-10-CM | POA: Diagnosis not present

## 2018-07-02 DIAGNOSIS — M109 Gout, unspecified: Secondary | ICD-10-CM | POA: Diagnosis not present

## 2018-07-02 HISTORY — PX: HEMORRHOID SURGERY: SHX153

## 2018-07-02 SURGERY — HEMORRHOIDECTOMY
Anesthesia: General | Site: Rectum

## 2018-07-02 MED ORDER — BUPIVACAINE LIPOSOME 1.3 % IJ SUSP: 20.0000 mL | Freq: Once | INTRAMUSCULAR | Status: DC

## 2018-07-02 MED ORDER — MIDAZOLAM HCL 2 MG/2ML IJ SOLN
INTRAMUSCULAR | Status: AC
  Filled 2018-07-02: qty 2

## 2018-07-02 MED ORDER — FENTANYL CITRATE (PF) 100 MCG/2ML IJ SOLN
INTRAMUSCULAR | Status: AC
  Filled 2018-07-02: qty 2

## 2018-07-02 MED ORDER — CELECOXIB 200 MG PO CAPS
ORAL_CAPSULE | ORAL | Status: AC
  Administered 2018-07-02: 07:00:00 200 mg via ORAL
  Filled 2018-07-02: qty 1

## 2018-07-02 MED ORDER — BUPIVACAINE-EPINEPHRINE 0.25% -1:200000 IJ SOLN
INTRAMUSCULAR | Status: DC | PRN
  Administered 2018-07-02: 10 mL

## 2018-07-02 MED ORDER — OXYCODONE HCL 5 MG PO TABS: 5.0000 mg | ORAL_TABLET | Freq: Once | ORAL | Status: DC | PRN

## 2018-07-02 MED ORDER — FENTANYL CITRATE (PF) 100 MCG/2ML IJ SOLN
INTRAMUSCULAR | Status: DC | PRN
  Administered 2018-07-02 (×2): 50 ug via INTRAVENOUS

## 2018-07-02 MED ORDER — OXYCODONE-ACETAMINOPHEN 5-325 MG PO TABS: 1.0000 | ORAL_TABLET | ORAL | 0 refills | Status: DC | PRN

## 2018-07-02 MED ORDER — PROPOFOL 10 MG/ML IV BOLUS
INTRAVENOUS | Status: AC
  Filled 2018-07-02: qty 20

## 2018-07-02 MED ORDER — PROPOFOL 10 MG/ML IV BOLUS
INTRAVENOUS | Status: DC | PRN
  Administered 2018-07-02: 30 mg via INTRAVENOUS
  Administered 2018-07-02: 200 mg via INTRAVENOUS
  Administered 2018-07-02: 20 mg via INTRAVENOUS

## 2018-07-02 MED ORDER — ACETAMINOPHEN 500 MG PO TABS
ORAL_TABLET | ORAL | Status: AC
  Administered 2018-07-02: 1000 mg via ORAL
  Filled 2018-07-02: qty 2

## 2018-07-02 MED ORDER — OXYCODONE HCL 5 MG/5ML PO SOLN: 5.0000 mg | Freq: Once | ORAL | Status: DC | PRN

## 2018-07-02 MED ORDER — MIDAZOLAM HCL 2 MG/2ML IJ SOLN
INTRAMUSCULAR | Status: DC | PRN
  Administered 2018-07-02: 2 mg via INTRAVENOUS

## 2018-07-02 MED ORDER — FENTANYL CITRATE (PF) 100 MCG/2ML IJ SOLN: 25.0000 ug | INTRAMUSCULAR | Status: DC | PRN

## 2018-07-02 MED ORDER — DEXAMETHASONE SODIUM PHOSPHATE 10 MG/ML IJ SOLN
INTRAMUSCULAR | Status: DC | PRN
  Administered 2018-07-02: 10 mg via INTRAVENOUS

## 2018-07-02 MED ORDER — GABAPENTIN 300 MG PO CAPS
ORAL_CAPSULE | ORAL | Status: AC
  Administered 2018-07-02: 07:00:00 300 mg via ORAL
  Filled 2018-07-02: qty 1

## 2018-07-02 MED ORDER — GABAPENTIN 300 MG PO CAPS
300.0000 mg | ORAL_CAPSULE | ORAL | Status: AC
  Administered 2018-07-02: 300 mg via ORAL

## 2018-07-02 MED ORDER — ACETAMINOPHEN 500 MG PO TABS
1000.0000 mg | ORAL_TABLET | ORAL | Status: AC
  Administered 2018-07-02: 07:00:00 1000 mg via ORAL

## 2018-07-02 MED ORDER — CELECOXIB 200 MG PO CAPS
200.0000 mg | ORAL_CAPSULE | ORAL | Status: AC
  Administered 2018-07-02: 200 mg via ORAL

## 2018-07-02 MED ORDER — 0.9 % SODIUM CHLORIDE (POUR BTL) OPTIME
TOPICAL | Status: DC | PRN
  Administered 2018-07-02: 500 mL

## 2018-07-02 MED ORDER — BUPIVACAINE LIPOSOME 1.3 % IJ SUSP
INTRAMUSCULAR | Status: AC
  Filled 2018-07-02: qty 20

## 2018-07-02 MED ORDER — BUPIVACAINE LIPOSOME 1.3 % IJ SUSP
INTRAMUSCULAR | Status: DC | PRN
  Administered 2018-07-02: 20 mL

## 2018-07-02 MED ORDER — CHLORHEXIDINE GLUCONATE CLOTH 2 % EX PADS
6.0000 | MEDICATED_PAD | Freq: Once | CUTANEOUS | Status: AC
  Administered 2018-07-02: 07:00:00 6 via TOPICAL

## 2018-07-02 MED ORDER — CHLORHEXIDINE GLUCONATE CLOTH 2 % EX PADS: 6.0000 | MEDICATED_PAD | Freq: Once | CUTANEOUS | Status: DC

## 2018-07-02 MED ORDER — LACTATED RINGERS IV SOLN
INTRAVENOUS | Status: DC
  Administered 2018-07-02: 07:00:00 via INTRAVENOUS

## 2018-07-02 MED ORDER — LIDOCAINE HCL (CARDIAC) PF 100 MG/5ML IV SOSY
PREFILLED_SYRINGE | INTRAVENOUS | Status: DC | PRN
  Administered 2018-07-02: 100 mg via INTRAVENOUS

## 2018-07-02 MED ORDER — BUPIVACAINE-EPINEPHRINE (PF) 0.25% -1:200000 IJ SOLN
INTRAMUSCULAR | Status: AC
  Filled 2018-07-02: qty 30

## 2018-07-02 SURGICAL SUPPLY — 17 items
BLADE CLIPPER SURG (BLADE) ×2 IMPLANT
CANISTER SUCT 1200ML W/VALVE (MISCELLANEOUS) ×2 IMPLANT
COVER WAND RF STERILE (DRAPES) ×2 IMPLANT
DRAPE LAPAROTOMY 100X77 ABD (DRAPES) IMPLANT
DRAPE LEGGINS SURG 28X43 STRL (DRAPES) IMPLANT
DRAPE UNDER BUTTOCK W/FLU (DRAPES) IMPLANT
ELECT CAUTERY BLADE 6.4 (BLADE) ×2 IMPLANT
ELECT REM PT RETURN 9FT ADLT (ELECTROSURGICAL) ×2
ELECTRODE REM PT RTRN 9FT ADLT (ELECTROSURGICAL) ×1 IMPLANT
GLOVE BIO SURGEON STRL SZ7 (GLOVE) ×2 IMPLANT
GOWN STRL REUS W/ TWL LRG LVL3 (GOWN DISPOSABLE) ×2 IMPLANT
GOWN STRL REUS W/TWL LRG LVL3 (GOWN DISPOSABLE) ×4
PACK BASIN MINOR ARMC (MISCELLANEOUS) ×2 IMPLANT
PAD PREP 24X41 OB/GYN DISP (PERSONAL CARE ITEMS) ×2 IMPLANT
SOL PREP PVP 2OZ (MISCELLANEOUS) ×2
SOLUTION PREP PVP 2OZ (MISCELLANEOUS) ×1 IMPLANT
SPONGE LAP 18X18 RF (DISPOSABLE) ×2 IMPLANT

## 2018-07-02 NOTE — Anesthesia Preprocedure Evaluation (Signed)
Anesthesia Evaluation  Patient identified by MRN, date of birth, ID band Patient awake    Reviewed: Allergy & Precautions, H&P , NPO status , Patient's Chart, lab work & pertinent test results  History of Anesthesia Complications (+) AWARENESS UNDER ANESTHESIA and history of anesthetic complications  Airway Mallampati: II  TM Distance: <3 FB Neck ROM: full    Dental  (+) Chipped   Pulmonary neg shortness of breath, former smoker,           Cardiovascular Exercise Tolerance: Good (-) angina(-) Past MI and (-) DOE negative cardio ROS       Neuro/Psych  Neuromuscular disease negative psych ROS   GI/Hepatic negative GI ROS, Neg liver ROS, neg GERD  ,  Endo/Other  negative endocrine ROS  Renal/GU      Musculoskeletal  (+) Arthritis ,   Abdominal   Peds  Hematology negative hematology ROS (+)   Anesthesia Other Findings Past Medical History: No date: Anemia 4481: Complication of anesthesia     Comment:  WOKE UP DURING INGUINAL HERNIA SURGERY 2000: Gastric bypass status for obesity     Comment:  roux en y No date: Gout No date: History of chicken pox  Past Surgical History: 07/20/2016: COLONOSCOPY WITH PROPOFOL; N/A     Comment:  1 TA, rpt 5 yrs Allen Norris, Darren, MD) 1990s: INGUINAL HERNIA REPAIR     Comment:  X2 07/20/2016: POLYPECTOMY     Comment:  Procedure: POLYPECTOMY;  Surgeon: Lucilla Lame, MD;                Location: [place];  Service: Endoscopy;; 2000: ROUX-EN-Y PROCEDURE  BMI    Body Mass Index: 35.71 kg/m      Reproductive/Obstetrics negative OB ROS                             Anesthesia Physical Anesthesia Plan  ASA: II  Anesthesia Plan: General LMA   Post-op Pain Management:    Induction: Intravenous  PONV Risk Score and Plan: Dexamethasone, Ondansetron, Midazolam and Treatment may vary due to age or medical condition  Airway Management Planned:  LMA  Additional Equipment:   Intra-op Plan:   Post-operative Plan: Extubation in OR  Informed Consent: I have reviewed the patients History and Physical, chart, labs and discussed the procedure including the risks, benefits and alternatives for the proposed anesthesia with the patient or authorized representative who has indicated his/her understanding and acceptance.     Dental Advisory Given  Plan Discussed with: Anesthesiologist, CRNA and Surgeon  Anesthesia Plan Comments: (Patient consented for risks of anesthesia including but not limited to:  - adverse reactions to medications - damage to teeth, lips or other oral mucosa - sore throat or hoarseness - Damage to heart, brain, lungs or loss of life  Patient voiced understanding.)        Anesthesia Quick Evaluation

## 2018-07-02 NOTE — Interval H&P Note (Signed)
History and Physical Interval Note:  07/02/2018 7:17 AM  Bruce Wang  has presented today for surgery, with the diagnosis of HEMORRHOID.  The various methods of treatment have been discussed with the patient and family. After consideration of risks, benefits and other options for treatment, the patient has consented to  Procedure(s): HEMORRHOIDECTOMY (N/A) as a surgical intervention.  The patient's history has been reviewed, patient examined, no change in status, stable for surgery.  I have reviewed the patient's chart and labs.  Questions were answered to the patient's satisfaction.     [place]

## 2018-07-02 NOTE — Anesthesia Procedure Notes (Signed)
Procedure Name: LMA Insertion Performed by: Philbert Riser, CRNA Pre-anesthesia Checklist: Patient identified, Emergency Drugs available, Suction available, Patient being monitored and Timeout performed Patient Re-evaluated:Patient Re-evaluated prior to induction Oxygen Delivery Method: Circle system utilized and Simple face mask Preoxygenation: Pre-oxygenation with 100% oxygen Induction Type: IV induction Ventilation: Mask ventilation without difficulty LMA: LMA inserted LMA Size: 4.5 Number of attempts: 1 Placement Confirmation: positive ETCO2 and breath sounds checked- equal and bilateral

## 2018-07-02 NOTE — Transfer of Care (Signed)
Immediate Anesthesia Transfer of Care Note  Patient: Bruce Wang  Procedure(s) Performed: HEMORRHOIDECTOMY (N/A Rectum)  Patient Location: PACU  Anesthesia Type:General  Level of Consciousness: awake, alert  and oriented  Airway & Oxygen Therapy: Patient Spontanous Breathing and Patient connected to face mask oxygen  Post-op Assessment: Report given to RN and Post -op Vital signs reviewed and stable  Post vital signs: Reviewed and stable  Last Vitals:  Vitals Value Taken Time  BP    Temp    Pulse 70 07/02/18 0828  Resp    SpO2 99 % 07/02/18 0828  Vitals shown include unvalidated device data.  Last Pain:  Vitals:   07/02/18 0641  TempSrc: Tympanic  PainSc: 0-No pain         Complications: No apparent anesthesia complications

## 2018-07-02 NOTE — Anesthesia Post-op Follow-up Note (Signed)
Anesthesia QCDR form completed.        

## 2018-07-02 NOTE — Discharge Instructions (Addendum)
Surgical Procedures for Hemorrhoids Surgical procedures can be used to treat hemorrhoids. Hemorrhoids are swollen veins that are inside the rectum (internal hemorrhoids) or around the anus (external hemorrhoids). They are caused by increased pressure in the anal area. This pressure may result from straining to have a bowel movement (constipation), diarrhea, pregnancy, obesity, anal sex, or sitting for long periods of time. Hemorrhoids can cause symptoms such as pain and bleeding. Surgery may be needed if diet changes, lifestyle changes, and other treatments do not help your symptoms. Various surgical methods may be used. Three common methods are:  Closed hemorrhoidectomy. The hemorrhoids are surgically removed, and the surgical cuts (incisions) are closed with stitches (sutures).  Open hemorrhoidectomy. The hemorrhoids are surgically removed, but the incisions are allowed to heal without sutures.  Stapled hemorrhoidopexy. The hemorrhoids are removed using a device that takes out a ring of excess tissue. Tell a health care provider about:  Any allergies you have.  All medicines you are taking, including vitamins, herbs, eye drops, creams, and over-the-counter medicines.  Any problems you or family members have had with anesthetic medicines.  Any blood disorders you have.  Any surgeries you have had.  Any medical conditions you have.  Whether you are pregnant or may be pregnant. What are the risks? Generally, this is a safe procedure. However, problems may occur, including:  Infection.  Bleeding.  Allergic reactions to medicines.  Damage to other structures or organs.  Pain.  Constipation.  Difficulty passing urine.  Narrowing of the anal canal (stenosis).  Difficulty controlling bowel movements (incontinence). What happens before the procedure?  Ask your health care provider about: ? Changing or stopping your regular medicines. This is especially important if you are  taking diabetes medicines or blood thinners. ? Taking medicines such as aspirin and ibuprofen. These medicines can thin your blood. Do not take these medicines before your procedure if your health care provider instructs you not to.  You may need to have a procedure to examine the inside of your colon with a scope (colonoscopy). Your health care provider may do this to make sure that there are no other causes for your bleeding or pain.  Follow instructions from your health care provider about eating or drinking restrictions.  You may be instructed to take a laxative and an enema to clean out your colon before surgery (bowel prep). Carefully follow instructions from your health care provider about bowel prep.  Ask your health care provider how your surgical site will be marked or identified.  You may be given antibiotic medicine to help prevent infection.  Plan to have someone take you home after the procedure. What happens during the procedure?  To reduce your risk of infection: ? Your health care team will wash or sanitize their hands. ? Your skin will be washed with soap.  An IV tube will be inserted into one of your veins.  You will be given one or more of the following: ? A medicine to help you relax (sedative). ? A medicine to numb the area (local anesthetic). ? A medicine to make you fall asleep (general anesthetic). ? A medicine that is injected into an area of your body to numb everything below the injection site (regional anesthetic).  A lubricating jelly may be placed into your rectum.  Your surgeon will insert a short scope (anoscope) into your rectum to examine the hemorrhoids.  One of the following hemorrhoid procedures will be performed. Closed Hemorrhoidectomy  Your surgeon will use  surgical instruments to open the tissue around the hemorrhoids.  The veins that supply the hemorrhoids will be tied off with a suture.  The hemorrhoids will be removed.  The tissue  that surrounds the hemorrhoids will be closed with sutures that your body can absorb (absorbable sutures). Open Hemorrhoidectomy  The hemorrhoids will be removed with surgical instruments.  The incisions will be left open to heal without sutures. Stapled Hemorrhoidopexy  Your surgeon will use a circular stapling device to remove the hemorrhoids.  The device will be inserted into your anus. It will remove a circular ring of tissue that includes hemorrhoid tissue and some tissue above the hemorrhoids.  The staples in the device will close the edges of removed tissue. This will cut off the blood supply to the hemorrhoids and will pull any remaining hemorrhoids back into place. Each of these procedures may vary among health care providers and hospitals. What happens after the procedure?  Your blood pressure, heart rate, breathing rate, and blood oxygen level will be monitored often until the medicines you were given have worn off.  You will be given pain medicine as needed. This information is not intended to replace advice given to you by your health care provider. Make sure you discuss any questions you have with your health care provider.  AMBULATORY SURGERY  DISCHARGE INSTRUCTIONS   1) The drugs that you were given will stay in your system until tomorrow so for the next 24 hours you should not:  A) Drive an automobile B) Make any legal decisions C) Drink any alcoholic beverage   2) You may resume regular meals tomorrow.  Today it is better to start with liquids and gradually work up to solid foods.  You may eat anything you prefer, but it is better to start with liquids, then soup and crackers, and gradually work up to solid foods.   3) Please notify your doctor immediately if you have any unusual bleeding, trouble breathing, redness and pain at the surgery site, drainage, fever, or pain not relieved by medication.    4) Additional Instructions:        Please contact  your physician with any problems or Same Day Surgery at [phone], Monday through Friday 6 am to 4 pm, or [place] [facility] number at [phone].   How to Take a [employer] A sitz bath is a warm water bath that may be used to care for your rectum, genital area, or the area between your rectum and genitals (perineum). For a sitz bath, the water only comes up to your hips and covers your buttocks. A sitz bath may done at home in a bathtub or with a portable sitz bath that fits over the toilet. Your health care provider may recommend a sitz bath to help:  Relieve pain and discomfort after delivering a baby.  Relieve pain and itching from hemorrhoids or anal fissures.  Relieve pain after certain surgeries.  Relax muscles that are sore or tight. How to take a sitz bath Take 3-4 sitz baths a day, or as many as told by your health care provider. Bathtub sitz bath To take a sitz bath in a bathtub: 1. Partially fill a bathtub with warm water. The water should be deep enough to cover your hips and buttocks when you are sitting in the tub. 2. If your health care provider told you to put medicine in the water, follow his or her instructions. 3. Sit in the water. 4. Open  the tub drain a little, and leave it open during your bath. 5. Turn on the warm water again, enough to replace the water that is draining out. Keep the water running throughout your bath. This helps keep the water at the right level and the right temperature. 6. Soak in the water for 15-20 minutes, or as long as told by your health care provider. 7. When you are done, be careful when you stand up. You may feel dizzy. 8. After the sitz bath, pat yourself dry. Do not rub your skin to dry it.  Over-the-toilet sitz bath To take a sitz bath with an over-the-toilet basin: 1. Follow the manufacturer's instructions. 2. Fill the basin with warm water. 3. If your health care provider told you to put medicine in the water,  follow his or her instructions. 4. Sit on the seat. Make sure the water covers your buttocks and perineum. 5. Soak in the water for 15-20 minutes, or as long as told by your health care provider. 6. After the sitz bath, pat yourself dry. Do not rub your skin to dry it. 7. Clean and dry the basin between uses. 8. Discard the basin if it cracks, or according to the manufacturer's instructions. Contact a health care provider if:  Your symptoms get worse. Do not continue with sitz baths if your symptoms get worse.  You have new symptoms. If this happens, do not continue with sitz baths until you talk with your health care provider. Summary  A sitz bath is a warm water bath in which the water only comes up to your hips and covers your buttocks.  A sitz bath may help relieve itching, relieve pain, and relax muscles that are sore or tight in the lower part of your body, including your genital area.  Take 3-4 sitz baths a day, or as many as told by your health care provider. Soak in the water for 15-20 minutes.  Do not continue with sitz baths if your symptoms get worse. This information is not intended to replace advice given to you by your health care provider. Make sure you discuss any questions you have with your health care provider. Document Released: 09/18/2003 Document Revised: 12/28/2016 Document Reviewed: 12/28/2016 Elsevier Interactive Patient Education  2019 [place] Released: 10/23/2008 Document Revised: 06/03/2015 Document Reviewed: 03/23/2014 Elsevier Interactive Patient Education  2019 [employer].

## 2018-07-02 NOTE — Anesthesia Postprocedure Evaluation (Signed)
Anesthesia Post Note  Patient: Bruce Wang  Procedure(s) Performed: HEMORRHOIDECTOMY (N/A Rectum)  Patient location during evaluation: PACU Anesthesia Type: General Level of consciousness: awake and alert Pain management: pain level controlled Vital Signs Assessment: post-procedure vital signs reviewed and stable Respiratory status: spontaneous breathing, nonlabored ventilation, respiratory function stable and patient connected to nasal cannula oxygen Cardiovascular status: blood pressure returned to baseline and stable Postop Assessment: no apparent nausea or vomiting Anesthetic complications: no     Last Vitals:  Vitals:   07/02/18 0854 07/02/18 0901  BP: (!) 131/97 (!) 134/95  Pulse: 66 61  Resp: 16 16  Temp: (!) 36.4 C (!) 36.2 C  SpO2: 95% 96%    Last Pain:  Vitals:   07/02/18 0901  TempSrc: Temporal  PainSc: 0-No pain                 Precious Haws Piscitello

## 2018-07-02 NOTE — Op Note (Signed)
  07/02/2018  8:17 AM  PATIENT:  Bruce Wang  53 y.o. male  PRE-OPERATIVE DIAGNOSIS:  Symptomatic hemorrhoids  POST-OPERATIVE DIAGNOSIS:  Same  PROCEDURE:  Excision of internal hemorrhoids x 2. Left lateral and posterior midline  FINDINGS: Grade IV Left lateral internal hemorrhoids with thrombosis, grade III posterior midline Internal hemorrhoids with thrombosis. Right lateral great III Internal hemorrhoids   SURGEON:  Surgeon(s) and Role:    * Emmakate Hypes F, MD - Primary   ANESTHESIA:  General LMA    DICTATION:  Patient was explained  About the  Procedure in detail. Risks, benefits, possible complications and a consent was obtained. The patient taken to the operating room and placed in the lithotomy position.   Sam under anesthesia revealed thrombosis of both left lateral and posterior midline hemorrhoidal cushions that we are significantly edematous.  I decided to only perform hemorrhoidectomies of these 2 sites.  We elevated the hemorrhoidal cushions and using the harmonic device we excised them in the standard fashion.  Was good hemostasis.  Because of significant hemorrhoidal disease I only wanted to do 2 hemorrhoidal cushions that were the largest and the more symptomatic.  He may need another hemorrhoidectomy in the future if the right side he wants start becoming symptomatic.  Liposomal Marcaine injected around the wound site. Needle and laparotomy counts were correct and there were no immediate complications.  Joslyn Ramos Sarita Haver, MD

## 2018-07-03 LAB — SURGICAL PATHOLOGY

## 2018-07-05 NOTE — Addendum Note (Signed)
Addendum  created 07/05/18 1001 by Doreen Salvage, CRNA   Charge Capture section accepted

## 2018-07-11 ENCOUNTER — Telehealth: Payer: Self-pay | Admitting: *Deleted

## 2018-07-11 NOTE — Telephone Encounter (Signed)
Hemorrhoidectomy 07/02/18. No pain, however with every daily bowel movement he is passing blood. 1/2 to 1 cup dark red to pinkish. Color varies daily. Since surgery.stool is soft. Denies weakness. Staying hydrated.   Left message with Dr.Pabon to call office.   Stop all aspirin related products and use tylenol for pain if needed.  Patient added to schedule 07/15/2018 @ 9 am with Dr.Pabon.

## 2018-07-11 NOTE — Telephone Encounter (Signed)
Patient called and stated that he had surgery on 07/02/18, hemorrhoidectomy and every time he has a Bowel Movement lots of blood, 1/2 cup to 1 cup of blood. Please call and advise

## 2018-07-15 ENCOUNTER — Encounter: Payer: Self-pay | Admitting: Surgery

## 2018-07-15 ENCOUNTER — Other Ambulatory Visit: Payer: Self-pay

## 2018-07-15 ENCOUNTER — Ambulatory Visit (INDEPENDENT_AMBULATORY_CARE_PROVIDER_SITE_OTHER): Payer: Self-pay | Admitting: Surgery

## 2018-07-15 VITALS — BP 140/78 | HR 82 | Temp 98.1°F | Ht 72.0 in | Wt 266.0 lb

## 2018-07-15 DIAGNOSIS — K642 Third degree hemorrhoids: Secondary | ICD-10-CM

## 2018-07-15 MED ORDER — OXYCODONE-ACETAMINOPHEN 5-325 MG PO TABS: 1.0000 | ORAL_TABLET | ORAL | 0 refills | Status: DC | PRN

## 2018-07-15 NOTE — Patient Instructions (Signed)
Return in ine month. Pain medication refilled and cream sent to [facility] in [place].

## 2018-07-15 NOTE — Progress Notes (Signed)
S/p hemorrhoidectomy a couple weeks ago. Persistent pain and some hematochezia.  No fevers no chills no other complaints.   PE: NAD Abd: soft, nt Rectal: Hemorrhoidectomy sites healing well, no evidence perineal sepsis or active bleeding  A/p overall doing well.  We will add nifedipine and lidocaine cream for adjuvant pain management.  We will do only 1 last refill of the Percocet for 20 pills. We will follow-up in about 3 to 4 weeks.  No evidence of surgical complications at this time

## 2018-07-17 ENCOUNTER — Encounter: Payer: BC Managed Care – PPO | Admitting: Surgery

## 2018-07-19 ENCOUNTER — Other Ambulatory Visit: Payer: Self-pay | Admitting: Family Medicine

## 2018-08-19 ENCOUNTER — Encounter: Payer: BC Managed Care – PPO | Admitting: Surgery

## 2018-08-21 ENCOUNTER — Encounter: Payer: BC Managed Care – PPO | Admitting: Surgery

## 2018-08-28 ENCOUNTER — Other Ambulatory Visit: Payer: Self-pay

## 2018-08-28 ENCOUNTER — Encounter: Payer: Self-pay | Admitting: Surgery

## 2018-08-28 ENCOUNTER — Ambulatory Visit (INDEPENDENT_AMBULATORY_CARE_PROVIDER_SITE_OTHER): Payer: BC Managed Care – PPO | Admitting: Surgery

## 2018-08-28 VITALS — BP 129/88 | HR 97 | Temp 97.3°F | Ht 72.0 in | Wt 269.4 lb

## 2018-08-28 DIAGNOSIS — K642 Third degree hemorrhoids: Secondary | ICD-10-CM

## 2018-08-28 NOTE — Patient Instructions (Signed)
   Follow-up with our office as needed.  Please call and ask to speak with a nurse if you develop questions or concerns.  

## 2018-08-28 NOTE — Progress Notes (Signed)
Outpatient Surgical Follow Up  08/28/2018  Bruce Wang is an 53 y.o. male.   Chief Complaint  Patient presents with  . Routine Post Op    Hemorrhoidectomy    HPI: s/p hemorrhoidectomy 6/23. Doing well, no pain, no hematochezia. Sees Dr. Allen Norris for colonoscopy. No fevers or chills. Nml Bms.  Past Medical History:  Diagnosis Date  . Anemia   . Complication of anesthesia 2005   WOKE UP DURING INGUINAL HERNIA SURGERY  . 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 Gout   . History of chicken pox     Past Surgical History:  Procedure Laterality Date  . COLONOSCOPY WITH PROPOFOL N/A 07/20/2016   1 TA, rpt 5 yrs Allen Norris, Darren, MD)  . HEMORRHOID SURGERY N/A 07/02/2018   Procedure: HEMORRHOIDECTOMY;  Surgeon: Jules Husbands, MD;  Location: [facility];  Service: General;  Laterality: N/A;  . INGUINAL HERNIA REPAIR  1990s   X2  . POLYPECTOMY  07/20/2016   Procedure: POLYPECTOMY;  Surgeon: Lucilla Lame, MD;  Location: [facility];  Service: Endoscopy;;  . ROUX-EN-Y PROCEDURE  2000    Family History  Problem Relation Age of Onset  . Hypertension Mother   . Hypertension Sister   . Cancer Paternal Uncle 12       colon cancer, prostate cancer  . Dementia Maternal Grandmother   . Parkinsonism Maternal Grandfather   . Parkinson's disease Maternal Grandfather   . Macular degeneration Father   . Macular degeneration Paternal Grandmother   . Coronary artery disease Neg Hx   . Stroke Neg Hx   . Diabetes Neg Hx     Social History:  reports that he quit smoking about 8 years ago. His smoking use included cigarettes. He has a 8.00 pack-year smoking history. He has never used smokeless tobacco. He reports current alcohol use of about 21.0 standard drinks of alcohol per week. He reports that he does not use drugs.  Allergies: No Known Allergies  Medications reviewed.    ROS Full ROS performed and is otherwise negative other than what is stated in HPI   BP 129/88    Pulse 97   Temp (!) 97.3 F (36.3 C)   Ht 6' (1.829 m)   Wt 269 lb 6.4 oz (122.2 kg)   SpO2 95%   BMI 36.54 kg/m   Physical Exam Vitals signs and nursing note reviewed. Exam conducted with a chaperone present.  Constitutional:      General: He is not in acute distress.    Appearance: Normal appearance. He is normal weight.  Pulmonary:     Effort: Pulmonary effort is normal. No respiratory distress.  Abdominal:     General: There is no distension.     Palpations: There is no mass.     Tenderness: There is no abdominal tenderness. There is no guarding or rebound.     Hernia: No hernia is present.  Genitourinary:    Comments: Completely healed hemorrhoidectomy site, no evidence of infection Skin:    General: Skin is warm and dry.     Capillary Refill: Capillary refill takes less than 2 seconds.  Neurological:     General: No focal deficit present.     Mental Status: He is alert and oriented to person, place, and time.  Psychiatric:        Mood and Affect: Mood normal.        Behavior: Behavior normal.  Thought Content: Thought content normal.        Judgment: Judgment normal.       Assessment/Plan: Doing very well after hemorrhoidectomy F//U prn  Greater than 50% of the 15 minutes  visit was spent in counseling/coordination of care   Caroleen Hamman, MD [facility]

## 2018-11-24 ENCOUNTER — Encounter: Payer: Self-pay | Admitting: Family Medicine

## 2018-11-25 ENCOUNTER — Other Ambulatory Visit: Payer: Self-pay

## 2018-11-25 ENCOUNTER — Encounter: Payer: Self-pay | Admitting: Family Medicine

## 2018-11-25 ENCOUNTER — Ambulatory Visit (INDEPENDENT_AMBULATORY_CARE_PROVIDER_SITE_OTHER): Payer: BC Managed Care – PPO | Admitting: Family Medicine

## 2018-11-25 DIAGNOSIS — J301 Allergic rhinitis due to pollen: Secondary | ICD-10-CM

## 2018-11-25 DIAGNOSIS — J309 Allergic rhinitis, unspecified: Secondary | ICD-10-CM | POA: Insufficient documentation

## 2018-11-25 MED ORDER — PREDNISONE 20 MG PO TABS: ORAL_TABLET | ORAL | 0 refills | Status: DC

## 2018-11-25 MED ORDER — AZELASTINE HCL 0.1 % NA SOLN: 2.0000 | Freq: Two times a day (BID) | NASAL | 5 refills | Status: DC

## 2018-11-25 NOTE — Telephone Encounter (Signed)
Attempted to contact pt.  No answer.  Line went dead.  Pt will need to call the office and schedule a virtual visit before antibiotics can be prescribed.  Marcelina Morel, sent this message to pt via MyChart.]

## 2018-11-25 NOTE — Progress Notes (Signed)
Virtual visit completed through [facility]. Due to national recommendations of social distancing due to COVID-19, a virtual visit is felt to be most appropriate for this patient at this time. Reviewed limitations of a virtual visit.   Patient location: home Provider location: [place] [facility], office If any vitals were documented, they were collected by patient at home unless specified below.    Temp 98 F (36.7 C)   Ht 6' (1.829 m)   Wt 260 lb (117.9 kg)   BMI 35.26 kg/m   BP Readings from Last 3 Encounters:  08/28/18 129/88  07/15/18 140/78  07/02/18 (!) 134/95    CC: sinus problems Subjective:    Patient ID: Bruce Wang, male    DOB: [birth year], 53 y.o.   MRN: [id number]  HPI: Bruce Wang is a 53 y.o. male presenting on 11/25/2018 for Sinus Problem (C/o sinus congestion.  Started mos ago.  Tried Zyrtec, Flonase and Afrin, barely helpful. )   Chronic sinus congestion for the past 3-4 months despite using zyrtec, flonase, pseudophed, and afrin nasal spray. Only afrin nasal spray was effective, until recently losing effect. Constant maxillary sinus pressure and sinus pressure headaches.   No fevers/chills, ear or tooth pain, facial pain, ST, PNdrainage, cough.  Blowing nose with dark white thick mucous.  No new pets.  Feels mask contributes to nasal congestion.   H/o perennial allergic rhinitis worse in fall.      Relevant past medical, surgical, family and social history reviewed and updated as indicated. Interim medical history since our last visit reviewed. Allergies and medications reviewed and updated. Outpatient Medications Prior to Visit  Medication Sig Dispense Refill  . allopurinol (ZYLOPRIM) 300 MG tablet TAKE 1 TABLET BY MOUTH DAILY (Patient taking differently: Take 300 mg by mouth at bedtime. ) 90 tablet 1  . cetirizine (ZYRTEC) 10 MG tablet Take 10 mg by mouth at bedtime.     . Cholecalciferol (VITAMIN D) 2000 units CAPS Take 2,000 Units by mouth daily.      . fluticasone (FLONASE) 50 MCG/ACT nasal spray INSTILL 2 SPRAYS INTO BOTH NOSTRILS DAILY 48 mL 1  . tadalafil (ADCIRCA/CIALIS) 20 MG tablet Take 1 tablet (20 mg total) by mouth every other day as needed for erectile dysfunction. 12 tablet 11  . vitamin B-12 (V-R VITAMIN B-12) 500 MCG tablet Take 1 tablet (500 mcg total) by mouth every Monday, Wednesday, and Friday.    . vitamin C (ASCORBIC ACID) 500 MG tablet Take 500 mg by mouth daily.     No facility-administered medications prior to visit.      Per HPI unless specifically indicated in ROS section below Review of Systems Objective:    Temp 98 F (36.7 C)   Ht 6' (1.829 m)   Wt 260 lb (117.9 kg)   BMI 35.26 kg/m   Wt Readings from Last 3 Encounters:  11/25/18 260 lb (117.9 kg)  08/28/18 269 lb 6.4 oz (122.2 kg)  07/15/18 266 lb (120.7 kg)     Physical exam: Gen: alert, NAD, not ill appearing HEENT: nasally congested voice Pulm: speaks in complete sentences without increased work of breathing Psych: normal mood, normal thought content      Assessment & Plan:   Problem List Items Addressed This Visit    Allergic rhinitis    Anticipate nasal congestion stemming from allergies + rhinitis medicamentosa from afrin overuse. Advised stop afrin, start astelin nasal spray in addition to his flonase, will Rx systemic prednisone taper  while he comes off afrin. Update in 2 wks with effect. Discussed possible singulair.  No clear signs of bacterial sinusitis at this time.          Meds ordered this encounter  Medications  . predniSONE (DELTASONE) 20 MG tablet    Sig: Take two tablets daily for 3 days followed by one tablet daily for 4 days    Dispense:  10 tablet    Refill:  0  . azelastine (ASTELIN) 0.1 % nasal spray    Sig: Place 2 sprays into both nostrils 2 (two) times daily. Use in each nostril as directed    Dispense:  30 mL    Refill:  5   No orders of the defined types were placed in this encounter.   I  discussed the assessment and treatment plan with the patient. The patient was provided an opportunity to ask questions and all were answered. The patient agreed with the plan and demonstrated an understanding of the instructions. The patient was advised to call back or seek an in-person evaluation if the symptoms worsen or if the condition fails to improve as anticipated.  Follow up plan: Return if symptoms worsen or fail to improve.  [name], MD

## 2018-11-25 NOTE — Assessment & Plan Note (Signed)
Anticipate nasal congestion stemming from allergies + rhinitis medicamentosa from afrin overuse. Advised stop afrin, start astelin nasal spray in addition to his flonase, will Rx systemic prednisone taper while he comes off afrin. Update in 2 wks with effect. Discussed possible singulair.  No clear signs of bacterial sinusitis at this time.

## 2018-11-26 ENCOUNTER — Encounter: Payer: Self-pay | Admitting: Family Medicine

## 2018-11-26 DIAGNOSIS — H524 Presbyopia: Secondary | ICD-10-CM | POA: Diagnosis not present

## 2018-11-26 DIAGNOSIS — Z135 Encounter for screening for eye and ear disorders: Secondary | ICD-10-CM | POA: Diagnosis not present

## 2018-11-26 DIAGNOSIS — J302 Other seasonal allergic rhinitis: Secondary | ICD-10-CM

## 2018-11-26 MED ORDER — ALLOPURINOL 300 MG PO TABS: 300.0000 mg | ORAL_TABLET | Freq: Every day | ORAL | 0 refills | Status: DC

## 2018-11-26 NOTE — Telephone Encounter (Signed)
E-scribed refill.  Notified pt via MyChart.  ?

## 2018-12-26 ENCOUNTER — Other Ambulatory Visit: Payer: Self-pay | Admitting: Family Medicine

## 2018-12-28 IMAGING — US US ABDOMEN COMPLETE
1 series · 14 of 25 positions shown · non-contrast
Comparison: None.

CLINICAL DATA: Abdominal discomfort, pressure and bloating

EXAM:
ABDOMEN ULTRASOUND COMPLETE

[Series 1: us abdomen complete · 0.31mm/px · 14 of 76 slices shown]
[im 1/76]
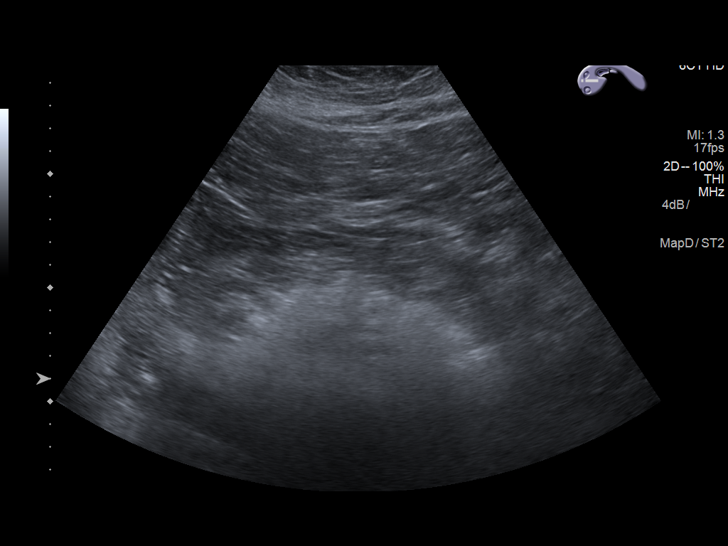
[im 7/76]
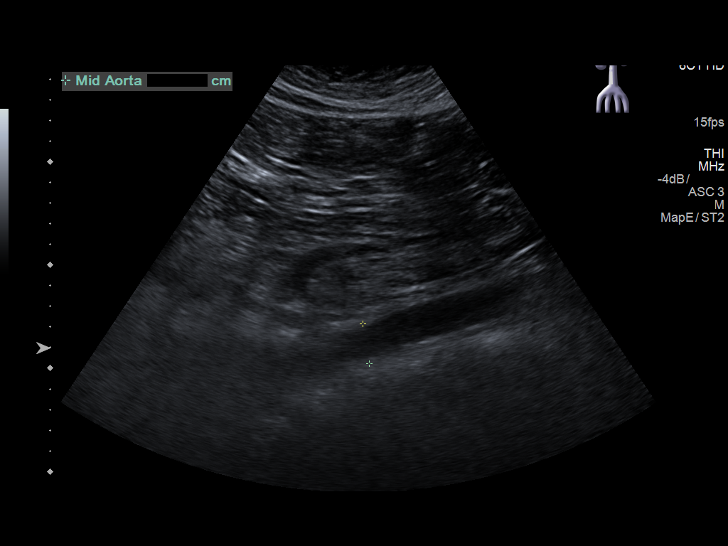
[im 13/76]
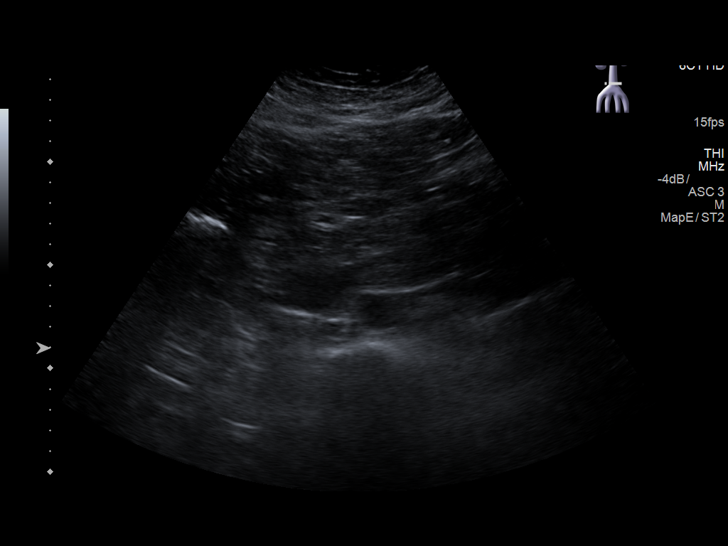
[im 19/76]
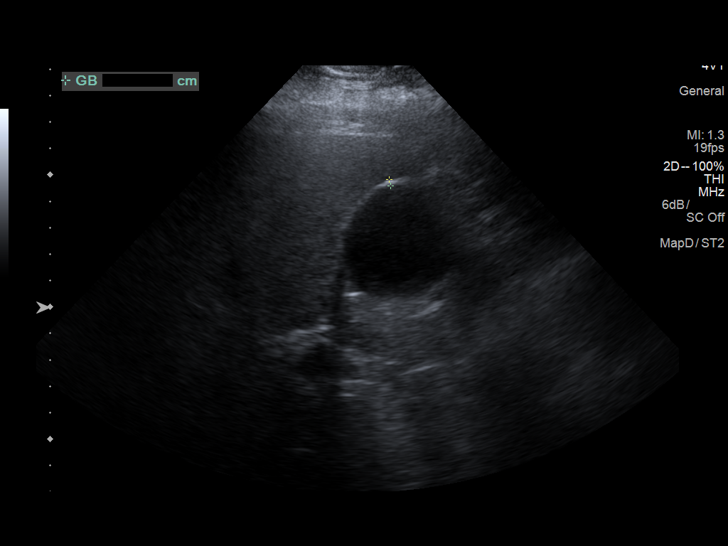
[im 26/76]
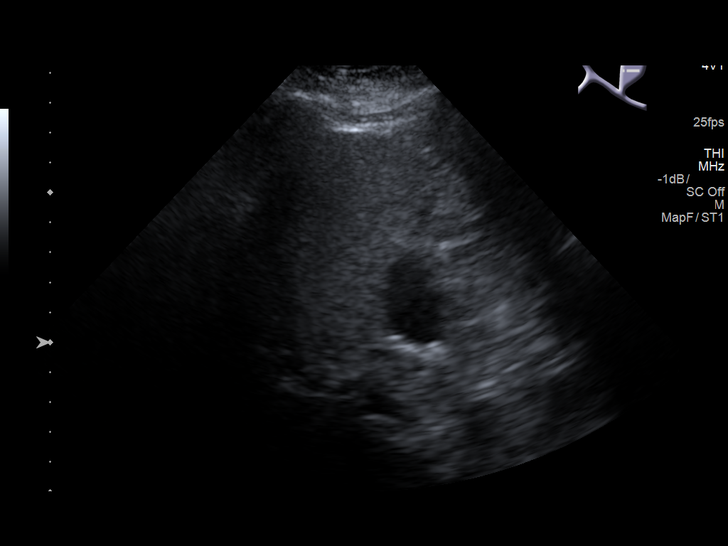
[im 29/76]
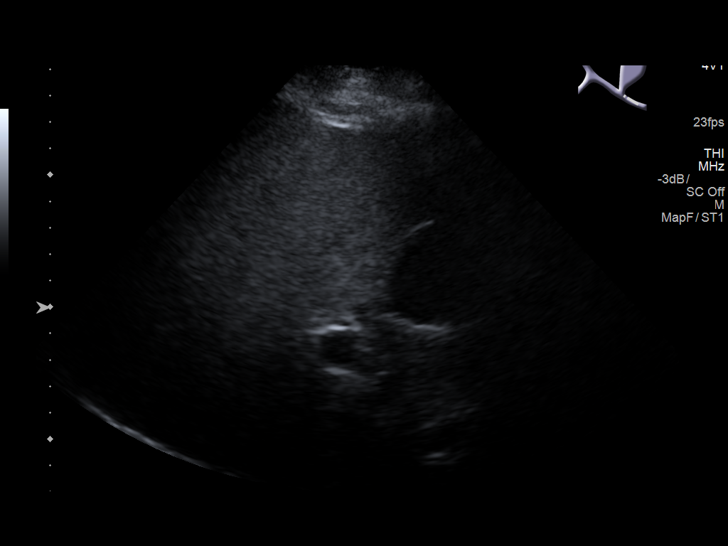
[im 35/76]
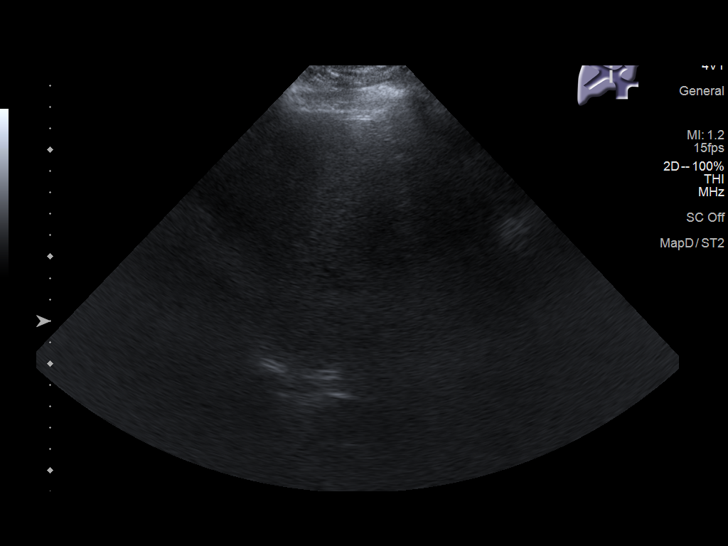
[im 41/76]
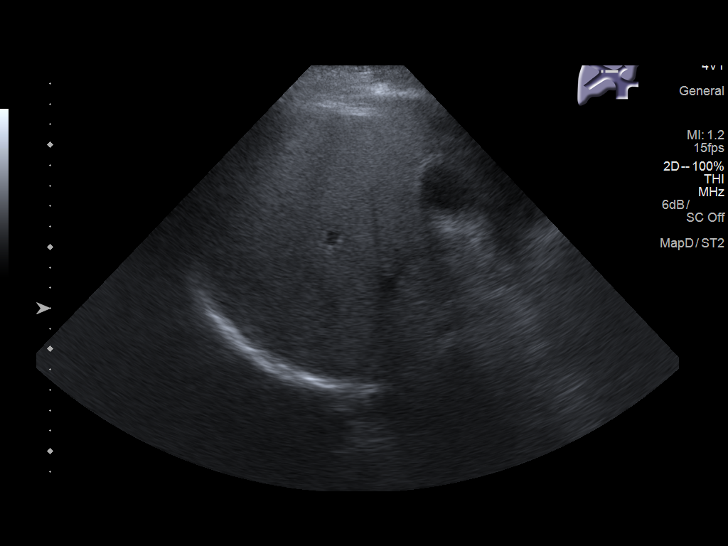
[im 47/76]
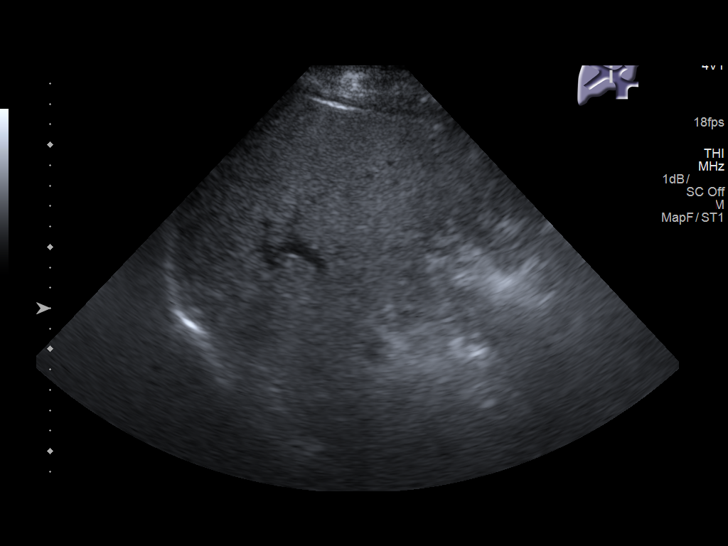
[im 51/76]
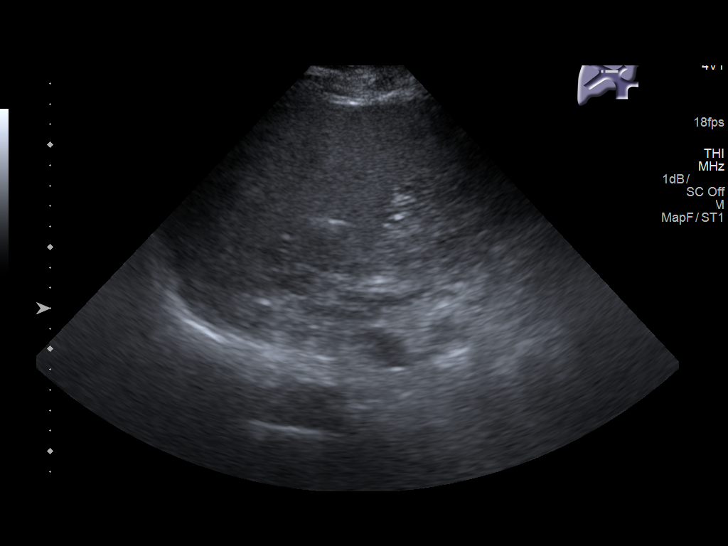
[im 57/76]
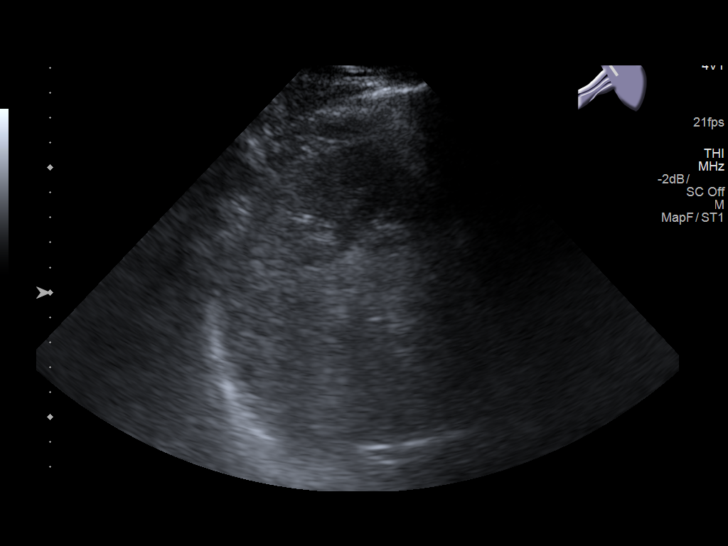
[im 63/76]
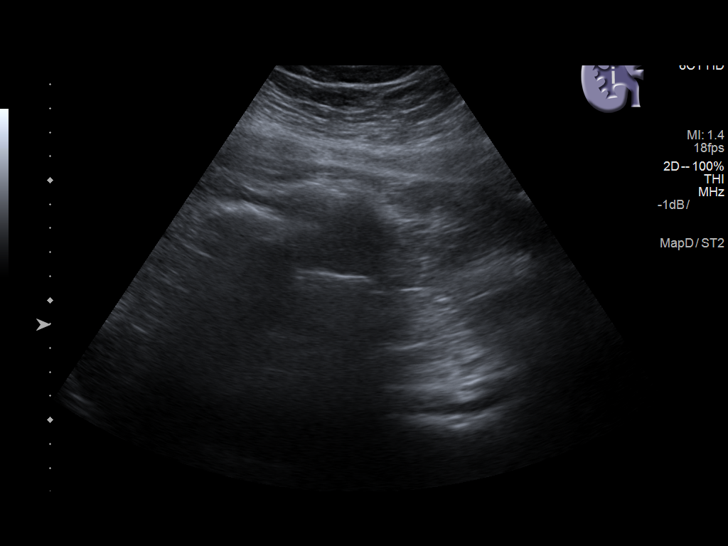
[im 69/76]
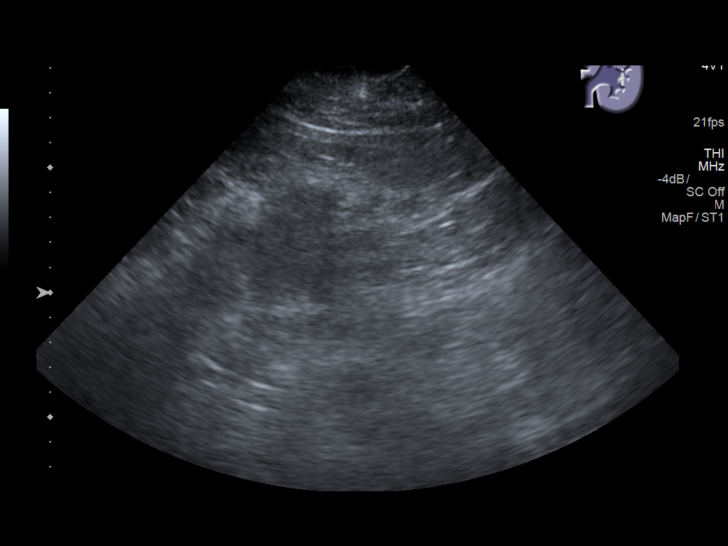
[im 76/76]
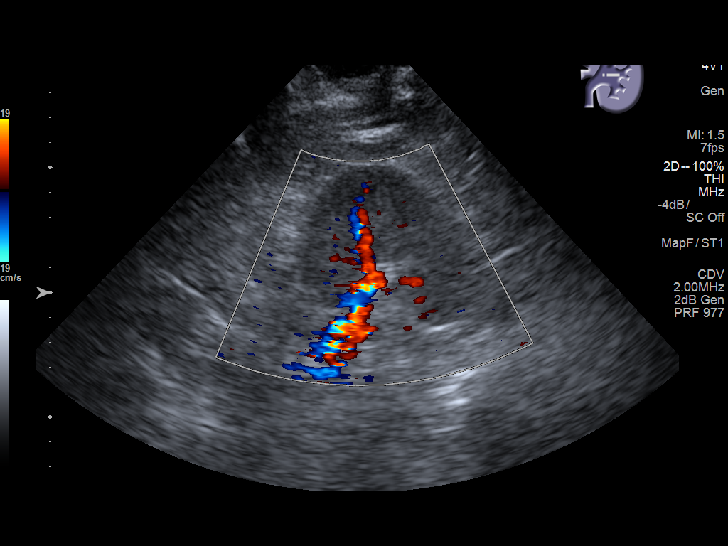

[14 of 25 positions shown; findings below may reference images not displayed]

FINDINGS: Gallbladder: Small stones in the gallbladder. No gallbladder wall
thickening pericholecystic fluid. Negative sonographic Murphy's
sign.

Common bile duct: Diameter:

Liver: No focal lesion identified. Uniform increase in hepatic
echogenicity. Portal vein is patent on color Doppler imaging with
normal direction of blood flow towards the liver.

IVC: No abnormality visualized.

Pancreas: Visualized portion unremarkable.

Spleen: Size and appearance within normal limits.

Right Kidney: Length: Not identified.

Left Kidney: Length: 13.6 cm.  No hydronephrosis

Abdominal aorta: No aneurysm visualized.

Other findings: None.
IMPRESSION: 1. No acute abdominal findings.
2. Cholelithiasis without cholecystitis.
3. Echogenic liver commonly represents hepatic steatosis. Cirrhosis
is less common consideration. Recommend clinical correlation.

## 2019-01-24 ENCOUNTER — Other Ambulatory Visit: Payer: Self-pay | Admitting: Family Medicine

## 2019-03-17 ENCOUNTER — Other Ambulatory Visit: Payer: Self-pay | Admitting: Family Medicine

## 2019-03-17 NOTE — Telephone Encounter (Signed)
Electronic refill request Cialis Last office visit 11/25/18 Last refill 12/26/18 #6/2

## 2019-05-14 ENCOUNTER — Encounter: Payer: Self-pay | Admitting: Family Medicine

## 2019-05-14 ENCOUNTER — Telehealth (INDEPENDENT_AMBULATORY_CARE_PROVIDER_SITE_OTHER): Payer: BC Managed Care – PPO | Admitting: Family Medicine

## 2019-05-14 VITALS — Temp 99.0°F | Wt 260.0 lb

## 2019-05-14 DIAGNOSIS — J301 Allergic rhinitis due to pollen: Secondary | ICD-10-CM

## 2019-05-14 DIAGNOSIS — J0140 Acute pansinusitis, unspecified: Secondary | ICD-10-CM

## 2019-05-14 MED ORDER — AMOXICILLIN-POT CLAVULANATE 875-125 MG PO TABS: 1.0000 | ORAL_TABLET | Freq: Two times a day (BID) | ORAL | 0 refills | Status: AC

## 2019-05-14 NOTE — Assessment & Plan Note (Signed)
Pt notes persistent symptoms x several months. Trial of prednisone in November with symptom improvement while taking, but returned immediately. Step 1: switch to different 24 hour allergy medication. Step 2: if no improvement trial of antibiotics given persistent symptoms and face pain. If not resolved, will consider ENT referral.

## 2019-05-14 NOTE — Progress Notes (Signed)
I connected with Bruce Wang on 05/14/19 at  9:20 AM EDT by video and verified that I am speaking with the correct person using two identifiers.   I discussed the limitations, risks, security and privacy concerns of performing an evaluation and management service by video and the availability of in person appointments. I also discussed with the patient that there may be a patient responsible charge related to this service. The patient expressed understanding and agreed to proceed.  Patient location: work Provider Location: [facility] Participants: Bruce Wang and Bruce Wang   Subjective:     Bruce Wang is a 54 y.o. male presenting for Nasal Congestion (for a while has had sinus congestion, post nasal drip, temp 99-99.5, chills, slight cough.)     Sinus Problem This is a recurrent problem. The current episode started more than 1 month ago. There has been no fever (99-99.5). Associated symptoms include congestion, coughing (at night from drainage), headaches and sinus pressure (maxillary). Pertinent negatives include no chills, ear pain or sore throat. Past treatments include spray decongestants (daily zyrtec, flonase, nose spray).   Has a nasal lavage that he uses every other day  Had a course of prednisone - but only noted symptom improvement when taking No dental pain  Review of Systems  Constitutional: Negative for chills and fever.  HENT: Positive for congestion, rhinorrhea and sinus pressure (maxillary). Negative for ear pain and sore throat.   Respiratory: Positive for cough (at night from drainage).        Difficulty breathing through the nose at night  Gastrointestinal: Negative for diarrhea, nausea and vomiting.  Musculoskeletal: Negative for arthralgias and myalgias.  Neurological: Positive for headaches.     Social History   Tobacco Use  Smoking Status Former Smoker  . Packs/day: 1.00  . Years: 8.00  . Pack years: 8.00  . Types: Cigarettes    . Quit date: 01/09/2010  . Years since quitting: 9.3  Smokeless Tobacco Never Used        Objective:   BP Readings from Last 3 Encounters:  08/28/18 129/88  07/15/18 140/78  07/02/18 (!) 134/95   Wt Readings from Last 3 Encounters:  05/14/19 260 lb (117.9 kg)  11/25/18 260 lb (117.9 kg)  08/28/18 269 lb 6.4 oz (122.2 kg)    Temp 99 F (37.2 C) Comment: per patient  Wt 260 lb (117.9 kg) Comment: per patient  BMI 35.26 kg/m    Physical Exam Constitutional:      Appearance: Normal appearance. He is not ill-appearing.  HENT:     Head: Normocephalic and atraumatic.     Right Ear: External ear normal.     Left Ear: External ear normal.     Nose: Congestion present.  Eyes:     Conjunctiva/sclera: Conjunctivae normal.  Pulmonary:     Effort: Pulmonary effort is normal. No respiratory distress.  Neurological:     Mental Status: He is alert. Mental status is at baseline.  Psychiatric:        Mood and Affect: Mood normal.        Behavior: Behavior normal.        Thought Content: Thought content normal.        Judgment: Judgment normal.         Assessment & Plan:   Problem List Items Addressed This Visit      Respiratory   Allergic rhinitis    Pt notes persistent symptoms x several months.  Trial of prednisone in November with symptom improvement while taking, but returned immediately. Step 1: switch to different 24 hour allergy medication. Step 2: if no improvement trial of antibiotics given persistent symptoms and face pain. If not resolved, will consider ENT referral.        Other Visit Diagnoses    Subacute pansinusitis    -  Primary   Relevant Medications   amoxicillin-clavulanate (AUGMENTIN) 875-125 MG tablet       Return if symptoms worsen or fail to improve.  Bruce Noe, MD

## 2019-05-20 ENCOUNTER — Encounter: Payer: Self-pay | Admitting: Family Medicine

## 2019-05-20 DIAGNOSIS — J301 Allergic rhinitis due to pollen: Secondary | ICD-10-CM

## 2019-05-20 DIAGNOSIS — J0140 Acute pansinusitis, unspecified: Secondary | ICD-10-CM

## 2019-05-22 DIAGNOSIS — J3489 Other specified disorders of nose and nasal sinuses: Secondary | ICD-10-CM | POA: Diagnosis not present

## 2019-05-22 DIAGNOSIS — T444X5A Adverse effect of predominantly alpha-adrenoreceptor agonists, initial encounter: Secondary | ICD-10-CM | POA: Diagnosis not present

## 2019-05-22 DIAGNOSIS — J301 Allergic rhinitis due to pollen: Secondary | ICD-10-CM | POA: Diagnosis not present

## 2019-05-22 DIAGNOSIS — J342 Deviated nasal septum: Secondary | ICD-10-CM | POA: Diagnosis not present

## 2019-05-24 ENCOUNTER — Other Ambulatory Visit: Payer: Self-pay | Admitting: Family Medicine

## 2019-05-24 DIAGNOSIS — J302 Other seasonal allergic rhinitis: Secondary | ICD-10-CM

## 2019-06-05 DIAGNOSIS — J3489 Other specified disorders of nose and nasal sinuses: Secondary | ICD-10-CM | POA: Diagnosis not present

## 2019-06-05 DIAGNOSIS — J301 Allergic rhinitis due to pollen: Secondary | ICD-10-CM | POA: Diagnosis not present

## 2019-07-10 DIAGNOSIS — U071 COVID-19: Secondary | ICD-10-CM

## 2019-07-10 DIAGNOSIS — J1282 Pneumonia due to coronavirus disease 2019: Secondary | ICD-10-CM

## 2019-07-10 HISTORY — DX: Pneumonia due to coronavirus disease 2019: J12.82

## 2019-07-10 HISTORY — DX: COVID-19: U07.1

## 2019-07-29 ENCOUNTER — Encounter: Payer: Self-pay | Admitting: Family Medicine

## 2019-07-29 ENCOUNTER — Ambulatory Visit
Admission: EM | Admit: 2019-07-29 | Discharge: 2019-07-29 | Disposition: A | Discharge status: Home / Self Care | Payer: BC Managed Care – PPO

## 2019-07-29 ENCOUNTER — Other Ambulatory Visit: Payer: Self-pay

## 2019-07-29 DIAGNOSIS — Z20822 Contact with and (suspected) exposure to covid-19: Secondary | ICD-10-CM | POA: Diagnosis not present

## 2019-07-29 DIAGNOSIS — R5383 Other fatigue: Secondary | ICD-10-CM | POA: Diagnosis not present

## 2019-07-29 DIAGNOSIS — R509 Fever, unspecified: Secondary | ICD-10-CM

## 2019-07-29 NOTE — Discharge Instructions (Signed)
Your COVID test is pending.  You should self quarantine until the test result is back.    Take Tylenol as needed for fever or discomfort.  Rest and keep yourself hydrated.    Go to the emergency department if you develop shortness of breath, severe diarrhea, high fever not relieved by Tylenol or ibuprofen, or other concerning symptoms.    

## 2019-07-29 NOTE — ED Triage Notes (Signed)
[facility] presents to [facility] for covid test, step daughter covid postive as of today. Pt eperiencing cough, and fever. Pt has been treating fever with ibuprofen. Pt also endorsing "scratcy throat", nasal congestion. Pt denies n/v/d, loss of taste or smell.

## 2019-07-29 NOTE — ED Provider Notes (Signed)
[place]   [id number] 07/29/19 Arrival Time: 0086   CC: COVID symptoms  SUBJECTIVE: History from: patient.  Bruce Wang is a 54 y.o. male who presents with abrupt onset of fever and fatigue earlier today. Reports that his step daughter tested positive for Covid. Denies recent travel. Took tylenol for his fever with relief. There are no aggravating factors. Denies previous symptoms in the past. Reports that he did not receive Covid vaccines. Denies chills, sinus pain, rhinorrhea, sore throat, SOB, wheezing, chest pain, nausea, changes in bowel or bladder habits.    ROS: As per HPI.  All other pertinent ROS negative.     Past Medical History:  Diagnosis Date  . Anemia   . Complication of anesthesia 2005   WOKE UP DURING INGUINAL HERNIA SURGERY  . 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 Gout   . History of chicken pox    Past Surgical History:  Procedure Laterality Date  . COLONOSCOPY WITH PROPOFOL N/A 07/20/2016   1 TA, rpt 5 yrs Allen Norris, Darren, MD)  . HEMORRHOID SURGERY N/A 07/02/2018   Procedure: HEMORRHOIDECTOMY;  Surgeon: Jules Husbands, MD;  Location: [facility];  Service: General;  Laterality: N/A;  . INGUINAL HERNIA REPAIR  1990s   X2  . POLYPECTOMY  07/20/2016   Procedure: POLYPECTOMY;  Surgeon: Lucilla Lame, MD;  Location: [facility];  Service: Endoscopy;;  . ROUX-EN-Y PROCEDURE  2000   No Known Allergies No current facility-administered medications on file prior to encounter.   Current Outpatient Medications on File Prior to Encounter  Medication Sig Dispense Refill  . allopurinol (ZYLOPRIM) 300 MG tablet Take 1 tablet (300 mg total) by mouth daily. NEEDS OFFICE VISIT 90 tablet 0  . azelastine (ASTELIN) 0.1 % nasal spray Place 2 sprays into both nostrils 2 (two) times daily. Use in each nostril as directed 30 mL 5  . cetirizine (ZYRTEC) 10 MG tablet Take 10 mg by mouth at bedtime.     . Cholecalciferol (VITAMIN D) 2000 units CAPS  Take 2,000 Units by mouth daily.     . fluticasone (FLONASE) 50 MCG/ACT nasal spray INSTILL 2 SPRAYS INTO BOTH NOSTRILS DAILY 48 mL 1  . ipratropium (ATROVENT) 0.03 % nasal spray SMARTSIG:1 Both Nares Every Night    . tadalafil (CIALIS) 20 MG tablet TAKE 1 TABLET BY MOUTH EVERY OTHER DAY AS NEEDED FOR ERECTILE DYSFUNCTION. *INSUR 6 TABS/25 DAYS* 6 tablet 2  . vitamin B-12 (V-R VITAMIN B-12) 500 MCG tablet Take 1 tablet (500 mcg total) by mouth every Monday, Wednesday, and Friday.    . vitamin C (ASCORBIC ACID) 500 MG tablet Take 500 mg by mouth daily.     Social History   Socioeconomic History  . Marital status: Divorced    Spouse name: Not on file  . Number of children: Not on file  . Years of education: Not on file  . Highest education level: Not on file  Occupational History  . Not on file  Tobacco Use  . Smoking status: Former Smoker    Packs/day: 1.00    Years: 8.00    Pack years: 8.00    Types: Cigarettes    Quit date: 01/09/2010    Years since quitting: 9.5  . Smokeless tobacco: Never Used  Vaping Use  . Vaping Use: Never used  Substance and Sexual Activity  . Alcohol use: Yes    Alcohol/week: 21.0 standard drinks    Types: 21 Cans  of beer per week    Comment: BEER OCC  . Drug use: No  . Sexual activity: Yes    Comment: wife  Other Topics Concern  . Not on file  Social History Narrative   Caffeine: 1 pot coffee/day   Lives with wife, 2 children, 2 dogs   Occupation: Editor, commissioning for [employer]   Edu: 2 yrs college   Activity: walking 2 mi/day   Diet: good water, fruits/vegetables daily, red meat 2x/wk, never fish.     alternates between low carb and eating healthy.     Social Determinants of Health   Financial Resource Strain:   . Difficulty of Paying Living Expenses:   Food Insecurity:   . Worried About Charity fundraiser in the Last Year:   . Arboriculturist in the Last Year:   Transportation Needs:   . Film/video editor (Medical):   Marland Kitchen Lack of  Transportation (Non-Medical):   Physical Activity:   . Days of Exercise per Week:   . Minutes of Exercise per Session:   Stress:   . Feeling of Stress :   Social Connections:   . Frequency of Communication with Friends and Family:   . Frequency of Social Gatherings with Friends and Family:   . Attends Religious Services:   . Active Member of Clubs or Organizations:   . Attends Archivist Meetings:   Marland Kitchen Marital Status:   Intimate Partner Violence:   . Fear of Current or Ex-Partner:   . Emotionally Abused:   Marland Kitchen Physically Abused:   . Sexually Abused:    Family History  Problem Relation Age of Onset  . Hypertension Mother   . Hypertension Sister   . Cancer Paternal Uncle 87       colon cancer, prostate cancer  . Dementia Maternal Grandmother   . Parkinsonism Maternal Grandfather   . Parkinson's disease Maternal Grandfather   . Macular degeneration Father   . Macular degeneration Paternal Grandmother   . Coronary artery disease Neg Hx   . Stroke Neg Hx   . Diabetes Neg Hx     OBJECTIVE:  Vitals:   07/29/19 1706 07/29/19 1709  BP:  133/80  Resp: 16   Temp: 99.1 F (37.3 C)   TempSrc: Oral   SpO2: 97%      General appearance: alert; appears fatigued, but nontoxic; speaking in full sentences and tolerating own secretions HEENT: NCAT; Ears: EACs clear, TMs pearly gray; Eyes: PERRL.  EOM grossly intact. Sinuses: nontender; Nose: nares patent without rhinorrhea, Throat: oropharynx clear, tonsils non erythematous or enlarged, uvula midline  Neck: supple without LAD Lungs: unlabored respirations, symmetrical air entry; cough: absent; no respiratory distress; CTAB Heart: regular rate and rhythm.  Radial pulses 2+ symmetrical bilaterally Skin: warm and dry Psychological: alert and cooperative; normal mood and affect  LABS:  No results found for this or any previous visit (from the past 24 hour(s)).   ASSESSMENT & PLAN:  1. Fever, unspecified fever cause   2.  Other fatigue   3. Exposure to COVID-19 virus     COVID testing ordered.  It will take between 1-2 days for test results.  Someone will contact you regarding abnormal results.    Patient should remain in quarantine until they have received Covid results.  If negative you may resume normal activities (go back to work/school) while practicing hand hygiene, social distance, and mask wearing.  If positive, patient should remain in quarantine for 10 days  from symptom onset AND greater than 72 hours after symptoms resolution (absence of fever without the use of fever-reducing medication and improvement in respiratory symptoms), whichever is longer Get plenty of rest and push fluids Use OTC zyrtec for nasal congestion, runny nose, and/or sore throat Use OTC flonase for nasal congestion and runny nose Use medications daily for symptom relief Use OTC medications like ibuprofen or tylenol as needed fever or pain Call or go to the ED if you have any new or worsening symptoms such as fever, worsening cough, shortness of breath, chest tightness, chest pain, turning blue, changes in mental status.  Reviewed expectations re: course of current medical issues. Questions answered. Outlined signs and symptoms indicating need for more acute intervention. Patient verbalized understanding. After Visit Summary given.         Faustino Congress, NP 07/29/19 1725

## 2019-07-30 LAB — SARS-COV-2, NAA 2 DAY TAT

## 2019-07-30 LAB — NOVEL CORONAVIRUS, NAA: SARS-CoV-2, NAA: DETECTED — AB

## 2019-07-31 ENCOUNTER — Telehealth: Payer: Self-pay | Admitting: Nurse Practitioner

## 2019-07-31 NOTE — Telephone Encounter (Signed)
Called to Discuss with patient about Covid symptoms and the use of bamlanivimab, a monoclonal antibody infusion for those with mild to moderate Covid symptoms and at a high risk of hospitalization.     Pt is qualified for this infusion at the [facility] infusion center due to co-morbid conditions and/or a member of an at-risk group.     Unable to reach pt - left message for patient to call back

## 2019-08-03 ENCOUNTER — Other Ambulatory Visit: Payer: Self-pay | Admitting: Adult Health

## 2019-08-03 DIAGNOSIS — U071 COVID-19: Secondary | ICD-10-CM

## 2019-08-03 MED ORDER — SODIUM CHLORIDE 0.9 % IV SOLN
Freq: Once | INTRAVENOUS | Status: AC
  Filled 2019-08-03: qty 5

## 2019-08-03 NOTE — Progress Notes (Signed)
I connected by phone with Bruce Wang on 08/03/2019 at 4:58 PM to discuss the potential use of a new treatment for mild to moderate COVID-19 viral infection in non-hospitalized patients.  This patient is a 54 y.o. male that meets the FDA criteria for Emergency Use Authorization of COVID monoclonal antibody casirivimab/imdevimab.  Has a (+) direct SARS-CoV-2 viral test result  Has mild or moderate COVID-19   Is NOT hospitalized due to COVID-19  Is within 10 days of symptom onset  Has at least one of the high risk factor(s) for progression to severe COVID-19 and/or hospitalization as defined in EUA.  Specific high risk criteria : BMI > 25   I have spoken and communicated the following to the patient or parent/caregiver regarding COVID monoclonal antibody treatment:  1. FDA has authorized the emergency use for the treatment of mild to moderate COVID-19 in adults and pediatric patients with positive results of direct SARS-CoV-2 viral testing who are 48 years of age and older weighing at least 40 kg, and who are at high risk for progressing to severe COVID-19 and/or hospitalization.  2. The significant known and potential risks and benefits of COVID monoclonal antibody, and the extent to which such potential risks and benefits are unknown.  3. Information on available alternative treatments and the risks and benefits of those alternatives, including clinical trials.  4. Patients treated with COVID monoclonal antibody should continue to self-isolate and use infection control measures (e.g., wear mask, isolate, social distance, avoid sharing personal items, clean and disinfect "high touch" surfaces, and frequent handwashing) according to CDC guidelines.   5. The patient or parent/caregiver has the option to accept or refuse COVID monoclonal antibody treatment.  After reviewing this information with the patient, The patient agreed to proceed with receiving casirivimab\imdevimab infusion and will  be provided a copy of the Fact sheet prior to receiving the infusion. Scot Dock 08/03/2019 4:58 PM

## 2019-08-04 ENCOUNTER — Ambulatory Visit (HOSPITAL_COMMUNITY)
Admission: RE | Admit: 2019-08-04 | Discharge: 2019-08-04 | Disposition: A | Discharge status: Home / Self Care | Payer: BC Managed Care – PPO | Source: Ambulatory Visit | Attending: Pulmonary Disease | Admitting: Pulmonary Disease

## 2019-08-04 DIAGNOSIS — U071 COVID-19: Secondary | ICD-10-CM | POA: Insufficient documentation

## 2019-08-04 LAB — GLUCOSE, CAPILLARY: Glucose-Capillary: 140 mg/dL — ABNORMAL HIGH (ref 70–99)

## 2019-08-04 MED ORDER — DIPHENHYDRAMINE HCL 50 MG/ML IJ SOLN: 50.0000 mg | Freq: Once | INTRAMUSCULAR | Status: DC | PRN

## 2019-08-04 MED ORDER — METHYLPREDNISOLONE SODIUM SUCC 125 MG IJ SOLR: 125.0000 mg | Freq: Once | INTRAMUSCULAR | Status: DC | PRN

## 2019-08-04 MED ORDER — ALBUTEROL SULFATE HFA 108 (90 BASE) MCG/ACT IN AERS: 2.0000 | INHALATION_SPRAY | Freq: Once | RESPIRATORY_TRACT | Status: DC | PRN

## 2019-08-04 MED ORDER — EPINEPHRINE 0.3 MG/0.3ML IJ SOAJ: 0.3000 mg | Freq: Once | INTRAMUSCULAR | Status: DC | PRN

## 2019-08-04 MED ORDER — FAMOTIDINE IN NACL 20-0.9 MG/50ML-% IV SOLN: 20.0000 mg | Freq: Once | INTRAVENOUS | Status: DC | PRN

## 2019-08-04 MED ORDER — SODIUM CHLORIDE 0.9 % IV SOLN: INTRAVENOUS | Status: DC | PRN

## 2019-08-04 NOTE — Discharge Instructions (Signed)

## 2019-08-04 NOTE — Progress Notes (Signed)
  Diagnosis: COVID-19  Physician:Dr Joya Gaskins   Procedure: [facility] Med: casirivimab\imdevimab infusion - Provided patient with casirivimab\imdevimab fact sheet for patients, parents and caregivers prior to infusion.  Complications: No immediate complications noted.  Discharge: Discharged home   Bruce Wang 08/04/2019

## 2019-08-05 ENCOUNTER — Inpatient Hospital Stay
Admission: EM | Admit: 2019-08-05 | Discharge: 2019-08-12 | DRG: 177 | Disposition: A | Discharge status: Home / Self Care | Payer: BC Managed Care – PPO | Attending: Internal Medicine | Admitting: Internal Medicine

## 2019-08-05 ENCOUNTER — Encounter: Payer: Self-pay | Admitting: Emergency Medicine

## 2019-08-05 ENCOUNTER — Inpatient Hospital Stay: Payer: BC Managed Care – PPO

## 2019-08-05 ENCOUNTER — Emergency Department: Payer: BC Managed Care – PPO

## 2019-08-05 ENCOUNTER — Other Ambulatory Visit: Payer: Self-pay

## 2019-08-05 DIAGNOSIS — R531 Weakness: Secondary | ICD-10-CM | POA: Diagnosis not present

## 2019-08-05 DIAGNOSIS — Z6837 Body mass index (BMI) 37.0-37.9, adult: Secondary | ICD-10-CM

## 2019-08-05 DIAGNOSIS — M109 Gout, unspecified: Secondary | ICD-10-CM | POA: Diagnosis not present

## 2019-08-05 DIAGNOSIS — E860 Dehydration: Secondary | ICD-10-CM | POA: Diagnosis present

## 2019-08-05 DIAGNOSIS — J9601 Acute respiratory failure with hypoxia: Secondary | ICD-10-CM | POA: Diagnosis not present

## 2019-08-05 DIAGNOSIS — Z87891 Personal history of nicotine dependence: Secondary | ICD-10-CM | POA: Diagnosis not present

## 2019-08-05 DIAGNOSIS — M79662 Pain in left lower leg: Secondary | ICD-10-CM | POA: Diagnosis not present

## 2019-08-05 DIAGNOSIS — J90 Pleural effusion, not elsewhere classified: Secondary | ICD-10-CM | POA: Diagnosis not present

## 2019-08-05 DIAGNOSIS — J9600 Acute respiratory failure, unspecified whether with hypoxia or hypercapnia: Secondary | ICD-10-CM | POA: Diagnosis not present

## 2019-08-05 DIAGNOSIS — J309 Allergic rhinitis, unspecified: Secondary | ICD-10-CM | POA: Diagnosis present

## 2019-08-05 DIAGNOSIS — Z8739 Personal history of other diseases of the musculoskeletal system and connective tissue: Secondary | ICD-10-CM | POA: Diagnosis not present

## 2019-08-05 DIAGNOSIS — R233 Spontaneous ecchymoses: Secondary | ICD-10-CM | POA: Diagnosis present

## 2019-08-05 DIAGNOSIS — R9389 Abnormal findings on diagnostic imaging of other specified body structures: Secondary | ICD-10-CM

## 2019-08-05 DIAGNOSIS — Z79899 Other long term (current) drug therapy: Secondary | ICD-10-CM | POA: Diagnosis not present

## 2019-08-05 DIAGNOSIS — R0602 Shortness of breath: Secondary | ICD-10-CM

## 2019-08-05 DIAGNOSIS — U071 COVID-19: Secondary | ICD-10-CM | POA: Diagnosis not present

## 2019-08-05 DIAGNOSIS — Z86010 Personal history of colonic polyps: Secondary | ICD-10-CM

## 2019-08-05 DIAGNOSIS — M79661 Pain in right lower leg: Secondary | ICD-10-CM | POA: Diagnosis not present

## 2019-08-05 DIAGNOSIS — Z9884 Bariatric surgery status: Secondary | ICD-10-CM

## 2019-08-05 DIAGNOSIS — J1282 Pneumonia due to coronavirus disease 2019: Secondary | ICD-10-CM

## 2019-08-05 DIAGNOSIS — J189 Pneumonia, unspecified organism: Secondary | ICD-10-CM | POA: Diagnosis not present

## 2019-08-05 LAB — BASIC METABOLIC PANEL
Anion gap: 11 (ref 5–15)
BUN: 17 mg/dL (ref 6–20)
CO2: 23 mmol/L (ref 22–32)
Calcium: 8.5 mg/dL — ABNORMAL LOW (ref 8.9–10.3)
Chloride: 106 mmol/L (ref 98–111)
Creatinine, Ser: 1.23 mg/dL (ref 0.61–1.24)
GFR calc Af Amer: >60 mL/min (ref >60)
GFR calc non Af Amer: >60 mL/min (ref >60)
Glucose, Bld: 122 mg/dL — ABNORMAL HIGH (ref 70–99)
Potassium: 3.6 mmol/L (ref 3.5–5.1)
Sodium: 140 mmol/L (ref 135–145)

## 2019-08-05 LAB — ABO/RH: ABO/RH(D): O POS

## 2019-08-05 LAB — CBC
HCT: 43.1 % (ref 39.0–52.0)
Hemoglobin: 13.9 g/dL (ref 13.0–17.0)
MCH: 29.7 pg (ref 26.0–34.0)
MCHC: 32.3 g/dL (ref 30.0–36.0)
MCV: 92.1 fL (ref 80.0–100.0)
Platelets: 166 10*3/uL (ref 150–400)
RBC: 4.68 MIL/uL (ref 4.22–5.81)
RDW: 13.1 % (ref 11.5–15.5)
WBC: 4 10*3/uL (ref 4.0–10.5)
nRBC: 0 % (ref 0.0–0.2)

## 2019-08-05 LAB — PROCALCITONIN: Procalcitonin: 0.42 ng/mL

## 2019-08-05 LAB — PROTIME-INR
INR: 0.9 (ref 0.8–1.2)
Prothrombin Time: 11.5 seconds (ref 11.4–15.2)

## 2019-08-05 LAB — APTT: aPTT: 34 seconds (ref 24–36)

## 2019-08-05 LAB — FIBRIN DERIVATIVES D-DIMER (ARMC ONLY): Fibrin derivatives D-dimer (ARMC): 886.34 ng/mL (FEU) — ABNORMAL HIGH (ref 0.00–499.00)

## 2019-08-05 LAB — FERRITIN: Ferritin: 346 ng/mL — ABNORMAL HIGH (ref 24–336)

## 2019-08-05 MED ORDER — FLUTICASONE PROPIONATE 50 MCG/ACT NA SUSP
1.0000 | Freq: Every day | NASAL | Status: DC
  Administered 2019-08-06 – 2019-08-12 (×6): 1 via NASAL
  Filled 2019-08-05 (×3): qty 16

## 2019-08-05 MED ORDER — AZELASTINE HCL 0.1 % NA SOLN
2.0000 | Freq: Two times a day (BID) | NASAL | Status: DC
  Administered 2019-08-06 – 2019-08-11 (×5): 2 via NASAL
  Filled 2019-08-05 (×3): qty 30

## 2019-08-05 MED ORDER — IPRATROPIUM BROMIDE 0.03 % NA SOLN
1.0000 | Freq: Every day | NASAL | Status: DC
  Administered 2019-08-07: 22:00:00 1 via NASAL
  Filled 2019-08-05: qty 30

## 2019-08-05 MED ORDER — CYANOCOBALAMIN 500 MCG PO TABS
500.0000 ug | ORAL_TABLET | ORAL | Status: DC
  Administered 2019-08-06 – 2019-08-11 (×3): 500 ug via ORAL
  Filled 2019-08-05 (×6): qty 1

## 2019-08-05 MED ORDER — ASCORBIC ACID 500 MG PO TABS
500.0000 mg | ORAL_TABLET | Freq: Every day | ORAL | Status: DC
  Administered 2019-08-05 – 2019-08-12 (×8): 500 mg via ORAL
  Filled 2019-08-05 (×8): qty 1

## 2019-08-05 MED ORDER — SODIUM CHLORIDE 0.9 % IV SOLN
100.0000 mg | Freq: Every day | INTRAVENOUS | Status: AC
  Administered 2019-08-06 – 2019-08-09 (×4): 100 mg via INTRAVENOUS
  Filled 2019-08-05 (×4): qty 100

## 2019-08-05 MED ORDER — AZELASTINE HCL 0.1 % NA SOLN: 2.0000 | Freq: Two times a day (BID) | NASAL | Status: DC

## 2019-08-05 MED ORDER — ALLOPURINOL 300 MG PO TABS
300.0000 mg | ORAL_TABLET | Freq: Every day | ORAL | Status: DC
  Administered 2019-08-06 – 2019-08-12 (×7): 300 mg via ORAL
  Filled 2019-08-05 (×4): qty 3
  Filled 2019-08-05: qty 1
  Filled 2019-08-05 (×2): qty 3
  Filled 2019-08-05 (×3): qty 1
  Filled 2019-08-05: qty 3
  Filled 2019-08-05 (×3): qty 1

## 2019-08-05 MED ORDER — LACTATED RINGERS IV BOLUS
1000.0000 mL | Freq: Once | INTRAVENOUS | Status: AC
  Administered 2019-08-05: 1000 mL via INTRAVENOUS

## 2019-08-05 MED ORDER — SODIUM CHLORIDE 0.9 % IV SOLN
200.0000 mg | Freq: Once | INTRAVENOUS | Status: AC
  Administered 2019-08-05: 200 mg via INTRAVENOUS
  Filled 2019-08-05: qty 40

## 2019-08-05 MED ORDER — DEXAMETHASONE 4 MG PO TABS: 6.0000 mg | ORAL_TABLET | ORAL | Status: DC

## 2019-08-05 MED ORDER — ONDANSETRON HCL 4 MG PO TABS: 4.0000 mg | ORAL_TABLET | Freq: Four times a day (QID) | ORAL | Status: DC | PRN

## 2019-08-05 MED ORDER — ZINC SULFATE 220 (50 ZN) MG PO CAPS
220.0000 mg | ORAL_CAPSULE | Freq: Every day | ORAL | Status: DC
  Administered 2019-08-05 – 2019-08-12 (×8): 220 mg via ORAL
  Filled 2019-08-05 (×8): qty 1

## 2019-08-05 MED ORDER — ACETAMINOPHEN 325 MG PO TABS: 650.0000 mg | ORAL_TABLET | Freq: Four times a day (QID) | ORAL | Status: DC | PRN

## 2019-08-05 MED ORDER — IPRATROPIUM-ALBUTEROL 20-100 MCG/ACT IN AERS
1.0000 | INHALATION_SPRAY | Freq: Four times a day (QID) | RESPIRATORY_TRACT | Status: DC
  Administered 2019-08-06 – 2019-08-12 (×25): 1 via RESPIRATORY_TRACT
  Filled 2019-08-05 (×3): qty 4

## 2019-08-05 MED ORDER — ENOXAPARIN SODIUM 40 MG/0.4ML ~~LOC~~ SOLN
40.0000 mg | SUBCUTANEOUS | Status: DC
  Administered 2019-08-05 – 2019-08-11 (×7): 40 mg via SUBCUTANEOUS
  Filled 2019-08-05 (×7): qty 0.4

## 2019-08-05 MED ORDER — VITAMIN D 25 MCG (1000 UNIT) PO TABS
2000.0000 [IU] | ORAL_TABLET | Freq: Every day | ORAL | Status: DC
  Administered 2019-08-06 – 2019-08-11 (×6): 2000 [IU] via ORAL
  Filled 2019-08-05 (×6): qty 2

## 2019-08-05 MED ORDER — IPRATROPIUM BROMIDE 0.03 % NA SOLN: 1.0000 | Freq: Every day | NASAL | Status: DC

## 2019-08-05 MED ORDER — HYDROCOD POLST-CPM POLST ER 10-8 MG/5ML PO SUER
5.0000 mL | Freq: Two times a day (BID) | ORAL | Status: DC | PRN
  Administered 2019-08-06 – 2019-08-10 (×5): 5 mL via ORAL
  Filled 2019-08-05 (×6): qty 5

## 2019-08-05 MED ORDER — GUAIFENESIN-DM 100-10 MG/5ML PO SYRP
10.0000 mL | ORAL_SOLUTION | ORAL | Status: DC | PRN
  Administered 2019-08-05 – 2019-08-10 (×2): 10 mL via ORAL
  Filled 2019-08-05 (×3): qty 10

## 2019-08-05 MED ORDER — ONDANSETRON HCL 4 MG/2ML IJ SOLN: 4.0000 mg | Freq: Four times a day (QID) | INTRAMUSCULAR | Status: DC | PRN

## 2019-08-05 MED ORDER — DEXAMETHASONE SODIUM PHOSPHATE 10 MG/ML IJ SOLN
6.0000 mg | Freq: Once | INTRAMUSCULAR | Status: AC
  Administered 2019-08-05: 6 mg via INTRAVENOUS
  Filled 2019-08-05: qty 1

## 2019-08-05 MED ORDER — LORATADINE 10 MG PO TABS
10.0000 mg | ORAL_TABLET | Freq: Every day | ORAL | Status: DC
  Administered 2019-08-06 – 2019-08-12 (×7): 10 mg via ORAL
  Filled 2019-08-05 (×7): qty 1

## 2019-08-05 MED ORDER — THIAMINE HCL 100 MG PO TABS
100.0000 mg | ORAL_TABLET | Freq: Every day | ORAL | Status: DC
  Administered 2019-08-05 – 2019-08-12 (×8): 100 mg via ORAL
  Filled 2019-08-05 (×9): qty 1

## 2019-08-05 NOTE — H&P (Signed)
[facility] [place] NAME: Bruce Wang    MR#:  [id number]  DATE OF BIRTH:  Feb 05, 1965  DATE OF ADMISSION:  08/05/2019  PRIMARY CARE PHYSICIAN: [name], MD   REQUESTING/REFERRING PHYSICIAN: Dr. Vladimir Crofts  CHIEF COMPLAINT:   Chief Complaint  Patient presents with  . Weakness  . Shortness of Breath    HISTORY OF PRESENT ILLNESS:  Semaje Wang  is a 54 y.o. male with a known history of recent diagnosis of Covid-19 1 week ago.  He states that 2 family members are being released from [facility] today.  He stated he went for an antibody infusion.  He has been getting worse by the day.  He has had a runny nose, cough, shortness of breath, chest pain when he coughs.  The patient has had fever and chills throughout the entire course.  He has noticed some bruising of his lower extremities.  Feeling weak and having some weight loss.  Having diarrhea since he was diagnosed.  In the ER, triage nurse notices pulse ox of 85%.  Patient was placed on 2 L of oxygen.  Chest x-ray concerning for Covid pneumonia.  Hospitalist services were contacted for further evaluation.  The patient has not had the COVID-19 vaccination.  PAST MEDICAL HISTORY:   Past Medical History:  Diagnosis Date  . Anemia   . Complication of anesthesia 2005   WOKE UP DURING INGUINAL HERNIA SURGERY  . 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 Gout   . Gout   . History of chicken pox     PAST SURGICAL HISTORY:   Past Surgical History:  Procedure Laterality Date  . COLONOSCOPY WITH PROPOFOL N/A 07/20/2016   1 TA, rpt 5 yrs Allen Norris, Darren, MD)  . HEMORRHOID SURGERY N/A 07/02/2018   Procedure: HEMORRHOIDECTOMY;  Surgeon: Jules Husbands, MD;  Location: [facility];  Service: General;  Laterality: N/A;  . INGUINAL HERNIA REPAIR  1990s   X2  . POLYPECTOMY  07/20/2016   Procedure: POLYPECTOMY;  Surgeon: Lucilla Lame, MD;  Location: [place];  Service: Endoscopy;;   . ROUX-EN-Y PROCEDURE  2000    SOCIAL HISTORY:   Social History   Tobacco Use  . Smoking status: Former Smoker    Packs/day: 1.00    Years: 8.00    Pack years: 8.00    Types: Cigarettes    Quit date: 01/09/2010    Years since quitting: 9.5  . Smokeless tobacco: Never Used  Substance Use Topics  . Alcohol use: Yes    Alcohol/week: 21.0 standard drinks    Types: 21 Cans of beer per week    Comment: BEER OCC    FAMILY HISTORY:   Family History  Problem Relation Age of Onset  . Hypertension Mother   . Hypertension Sister   . Cancer Paternal Uncle 60       colon cancer, prostate cancer  . Dementia Maternal Grandmother   . Parkinsonism Maternal Grandfather   . Parkinson's disease Maternal Grandfather   . Macular degeneration Father   . Macular degeneration Paternal Grandmother   . Coronary artery disease Neg Hx   . Stroke Neg Hx   . Diabetes Neg Hx     DRUG ALLERGIES:  No Known Allergies  REVIEW OF SYSTEMS:  CONSTITUTIONAL: Positive for fever, chills, weight loss and fatigue.  EYES: No blurred or double vision.  EARS, NOSE, AND THROAT: No tinnitus or ear pain.  Positive for sore throat runny nose RESPIRATORY: Positive for cough, shortness of breath, and wheezing.  No hemoptysis.  CARDIOVASCULAR: Positive for chest pain with deep breath.  No orthopnea, edema.  GASTROINTESTINAL: No nausea, vomiting.positive for diarrhea.  No abdominal pain. No blood in bowel movements GENITOURINARY: No dysuria, hematuria.  ENDOCRINE: No polyuria, nocturia,  HEMATOLOGY: No anemia. SKIN: Bruising bilateral lower extremities MUSCULOSKELETAL: Pain in bilateral lower extremities NEUROLOGIC: No tingling, numbness, weakness.  PSYCHIATRY: No anxiety or depression.   MEDICATIONS AT HOME:   Prior to Admission medications   Medication Sig Start Date End Date Taking? Authorizing Provider  allopurinol (ZYLOPRIM) 300 MG tablet Take 1 tablet (300 mg total) by mouth daily. NEEDS OFFICE VISIT  05/24/19   [name], MD  azelastine (ASTELIN) 0.1 % nasal spray Place 2 sprays into both nostrils 2 (two) times daily. Use in each nostril as directed 11/25/18   [name], MD  cetirizine (ZYRTEC) 10 MG tablet Take 10 mg by mouth at bedtime.     [provider]  Cholecalciferol (VITAMIN D) 2000 units CAPS Take 2,000 Units by mouth daily.     [provider]  fluticasone (FLONASE) 50 MCG/ACT nasal spray INSTILL 2 SPRAYS INTO BOTH NOSTRILS DAILY 01/24/19   [name], MD  ipratropium (ATROVENT) 0.03 % nasal spray SMARTSIG:1 Both Nares Every Night 07/21/19   [provider]  tadalafil (CIALIS) 20 MG tablet TAKE 1 TABLET BY MOUTH EVERY OTHER DAY AS NEEDED FOR ERECTILE DYSFUNCTION. *INSUR 6 TABS/25 DAYS* 03/17/19   [name], MD  vitamin B-12 (V-R VITAMIN B-12) 500 MCG tablet Take 1 tablet (500 mcg total) by mouth every Monday, Wednesday, and Friday. 11/21/17   [name], MD  vitamin C (ASCORBIC ACID) 500 MG tablet Take 500 mg by mouth daily.    [provider]      VITAL SIGNS:  Blood pressure (!) 110/87, pulse 94, temperature 98.2 F (36.8 C), temperature source Oral, resp. rate (!) 27, height 6' (1.829 m), weight (!) 127 kg, SpO2 92 %.  PHYSICAL EXAMINATION:  GENERAL:  54 y.o.-year-old patient lying in the bed with no acute distress.  Patient coughing quite a bit EYES: Pupils equal, round, reactive to light and accommodation. No scleral icterus. Extraocular muscles intact.  HEENT: Head atraumatic, normocephalic. Oropharynx and nasopharynx clear.  NECK:  Supple, no jugular venous distention. LUNGS: Decreased breath sounds bilaterally, coughing with deep breath, slight expiratory wheezing.  No rales,rhonchi or crepitation. No use of accessory muscles of respiration.  CARDIOVASCULAR: S1, S2 normal. No murmurs, rubs, or gallops.  ABDOMEN: Soft, nontender, nondistended. Bowel sounds present. No organomegaly or mass.  EXTREMITIES:  No pedal edema, cyanosis, or clubbing.  NEUROLOGIC: Cranial nerves II through XII are intact. Muscle strength 5/5 in all extremities. Sensation intact. Gait not checked.  PSYCHIATRIC: The patient is alert and oriented x 3.  SKIN: No rash, lesion, or ulcer.  Bruising left lower extremity seen.  Unable to assess on right lower extremity secondary to tattoo.  LABORATORY PANEL:   CBC Recent Labs  Lab 08/05/19 1255  WBC 4.0  HGB 13.9  HCT 43.1  PLT 166   ------------------------------------------------------------------------------------------------------------------  Chemistries  Recent Labs  Lab 08/05/19 1255  NA 140  K 3.6  CL 106  CO2 23  GLUCOSE 122*  BUN 17  CREATININE 1.23  CALCIUM 8.5*   ------------------------------------------------------------------------------------------------------------------   RADIOLOGY:  DG Chest 2 View  Result Date: 08/05/2019 CLINICAL DATA:  Shortness of breath. Additional provided: Positive testing for  COVID last week, nonproductive cough, shortness of breath. EXAM: CHEST - 2 VIEW COMPARISON:  No pertinent prior exams are available for comparison. FINDINGS: Heart size within normal limits. Extensive interstitial and ill-defined opacity throughout both lungs likely reflecting sequela of atypical/viral pneumonia given the provided history. No evidence of pleural effusion or pneumothorax. No acute bony abnormality identified. IMPRESSION: Extensive interstitial and ill-defined opacities throughout both lungs likely reflecting sequela of atypical/viral pneumonia given the provided history. Electronically Signed   By: Kellie Simmering DO   On: 08/05/2019 13:45    EKG:   Normal sinus rhythm 91 bpm.  No acute ST-T wave changes.  IMPRESSION AND PLAN:   1.  Acute hypoxic respiratory failure secondary to COVID-19.  COVID-19 bilateral pneumonia.  Send off inflammatory markers.  Start Decadron 6 mg daily.  Start remdesivir.  Oxygen supplementation.   Vitamin C, zinc, thiamine and vitamin D ordered.  Combivent inhaler. 2.  Spontaneous bruising bilateral lower extremity with soreness in his calves.  ER physician ordered an ultrasound the lower extremity which has not been done yet.  Will order PT INR and PTT.  Current platelet count okay at 166.  Send off ferritin and fibrin derivatives. 3.  Gout on allopurinol 4.  Allergic rhinitis on nasal sprays and Zyrtec.  All the records, laboratory and radiological data are reviewed and case discussed with ED provider. Management plans discussed with the patient, and he is in agreement.  CODE STATUS: Full code  TOTAL TIME TAKING CARE OF THIS PATIENT: 50 minutes.    Loletha Grayer M.D on 08/05/2019 at 4:57 PM  Between 7am to 6pm - Pager - [phone]  After 6pm call admission pager [phone]  [facility]  CC: Primary care physician; [name], MD

## 2019-08-05 NOTE — ED Notes (Signed)
One set of cultures collected with IV start and sent to lab

## 2019-08-05 NOTE — ED Notes (Addendum)
Attempted to call report to the floor, Musician states- 1C's charge nurse has not "approved" the bed. Anticipating RN informed that this bed has been assigned since 20:37. RN Museum/gallery conservator states they will call this RN back shortly for report.

## 2019-08-05 NOTE — ED Provider Notes (Signed)
[facility] Emergency Department Provider Note ____________________________________________   First MD Initiated Contact with Patient 08/05/19 1542     (approximate)  I have reviewed the triage vital signs and the nursing notes.  HISTORY  Chief Complaint Weakness and Shortness of Breath   HPI Bruce Wang is a 54 y.o. male presenting with shortness of breath and weakness associated with COVID-19.    Patient seen at urgent care 7 days ago for generalized fatigue and fever, testing positive for COVID-19.  Symptoms for total of 8 days.  Sick exposure with his fiance and stepdaughter x2.  Patient reports getting outpatient antibiotic infusions, as recently as yesterday.  Patient reports progressively worsening generalized weakness, anorexia, shortness of breath and nonproductive cough.  The symptoms have been constant, worsening and ongoing.  He denies any focal pain.  Reports lightheaded dizziness and episode of syncope while standing yesterday.  Denies head trauma or injuries from the syncopal episode.  Denies chest pain prior to syncope.  Reports concern for dehydration and requesting fluids.  Never been vaccinated for COVID-19.  Remote smoking, quitting cigarettes 9 years ago.    Past Medical History:  Diagnosis Date  . Anemia   . Complication of anesthesia 2005   WOKE UP DURING INGUINAL HERNIA SURGERY  . 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 Gout   . History of chicken pox     Patient Active Problem List   Diagnosis Date Noted  . Allergic rhinitis 11/25/2018  . Pain of right heel 05/09/2017  . Fatty liver 02/03/2017  . Abdominal discomfort 01/26/2017  . Iron deficiency 01/26/2017  . Rash/skin eruption 08/22/2016  . Chronic radicular pain of lower back 07/25/2016  . Health maintenance examination 05/30/2016  . Vitamin B12 deficiency 05/30/2016  . Vitamin D deficiency 05/30/2016  . Erectile dysfunction 05/30/2016  . 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 Gout 06/01/2015  . HNP (herniated nucleus pulposus), lumbar 01/23/2014  . Severe obesity (BMI 35.0-39.9) with comorbidity ([place]) 03/16/2011  . Insomnia 03/16/2011  . Gastric bypass status for obesity     Past Surgical History:  Procedure Laterality Date  . COLONOSCOPY WITH PROPOFOL N/A 07/20/2016   1 TA, rpt 5 yrs Allen Norris, Darren, MD)  . HEMORRHOID SURGERY N/A 07/02/2018   Procedure: HEMORRHOIDECTOMY;  Surgeon: Jules Husbands, MD;  Location: [facility] ORS;  Service: General;  Laterality: N/A;  . INGUINAL HERNIA REPAIR  1990s   X2  . POLYPECTOMY  07/20/2016   Procedure: POLYPECTOMY;  Surgeon: Lucilla Lame, MD;  Location: [place];  Service: Endoscopy;;  . ROUX-EN-Y PROCEDURE  2000    Prior to Admission medications   Medication Sig Start Date End Date Taking? Authorizing Provider  allopurinol (ZYLOPRIM) 300 MG tablet Take 1 tablet (300 mg total) by mouth daily. NEEDS OFFICE VISIT 05/24/19   [name], MD  azelastine (ASTELIN) 0.1 % nasal spray Place 2 sprays into both nostrils 2 (two) times daily. Use in each nostril as directed 11/25/18   [name], MD  cetirizine (ZYRTEC) 10 MG tablet Take 10 mg by mouth at bedtime.     [provider]  Cholecalciferol (VITAMIN D) 2000 units CAPS Take 2,000 Units by mouth daily.     [provider]  fluticasone (FLONASE) 50 MCG/ACT nasal spray INSTILL 2 SPRAYS INTO BOTH NOSTRILS DAILY 01/24/19   [name], MD  ipratropium (ATROVENT) 0.03 % nasal spray SMARTSIG:1 Both Nares Every Night 07/21/19   [provider]  tadalafil (CIALIS) 20 MG tablet TAKE 1 TABLET BY MOUTH EVERY OTHER DAY AS NEEDED FOR ERECTILE DYSFUNCTION. *INSUR 6 TABS/25 DAYS* 03/17/19   [name], MD  vitamin B-12 (V-R VITAMIN B-12) 500 MCG tablet Take 1 tablet (500 mcg total) by mouth every Monday, Wednesday, and Friday. 11/21/17   [name], MD  vitamin C (ASCORBIC ACID) 500 MG tablet Take 500 mg by mouth daily.     [provider]    Allergies Patient has no known allergies.  Family History  Problem Relation Age of Onset  . Hypertension Mother   . Hypertension Sister   . Cancer Paternal Uncle 48       colon cancer, prostate cancer  . Dementia Maternal Grandmother   . Parkinsonism Maternal Grandfather   . Parkinson's disease Maternal Grandfather   . Macular degeneration Father   . Macular degeneration Paternal Grandmother   . Coronary artery disease Neg Hx   . Stroke Neg Hx   . Diabetes Neg Hx     Social History Social History   Tobacco Use  . Smoking status: Former Smoker    Packs/day: 1.00    Years: 8.00    Pack years: 8.00    Types: Cigarettes    Quit date: 01/09/2010    Years since quitting: 9.5  . Smokeless tobacco: Never Used  Vaping Use  . Vaping Use: Never used  Substance Use Topics  . Alcohol use: Yes    Alcohol/week: 21.0 standard drinks    Types: 21 Cans of beer per week    Comment: BEER OCC  . Drug use: No    Review of Systems  Constitutional: No fever/chills positive for generalized weakness. Eyes: No visual changes. ENT: No sore throat. Cardiovascular: Denies chest pain. Respiratory: Positive shortness of breath. Gastrointestinal: No abdominal pain.  No nausea, no vomiting.  No diarrhea.  No constipation. Genitourinary: Negative for dysuria. Musculoskeletal: Negative for back pain. Skin: Negative for rash. Neurological: Negative for headaches, focal weakness or numbness.   ____________________________________________   PHYSICAL EXAM:  VITAL SIGNS: ED Triage Vitals  Enc Vitals Group     BP 08/05/19 1251 (!) 138/107     Pulse Rate 08/05/19 1251 81     Resp 08/05/19 1251 (!) 30     Temp 08/05/19 1251 98 F (36.7 C)     Temp Source 08/05/19 1251 Oral     SpO2 08/05/19 1251 91 %     Weight 08/05/19 1252 (!) 280 lb (127 kg)     Height 08/05/19 1252 6' (1.829 m)     Head Circumference --      Peak Flow --      Pain Score 08/05/19 1252  0     Pain Loc --      Pain Edu? --      Excl. in [place]? --      Constitutional: Alert and oriented.  Sitting up in bed, clearly dyspneic and uncomfortable.  But conversational in full sentences.  Nasal cannula ongoing. Eyes: Conjunctivae are normal. PERRL. EOMI. Head: Atraumatic. Nose: No congestion/rhinnorhea. Mouth/Throat: Mucous membranes are dry.  Oropharynx non-erythematous. Neck: No stridor. No cervical spine tenderness to palpation. Cardiovascular: Normal rate, regular rhythm. Grossly normal heart sounds.  Good peripheral circulation. Respiratory: .  No retractions. Lungs CTAB.  Tachypneic to upper 20s.  Requiring nasal cannula.  Appears winded. Gastrointestinal: Soft , nondistended, nontender to palpation. No abdominal bruits. No CVA tenderness. Musculoskeletal: No lower extremity tenderness nor edema.  No joint effusions.  No signs of acute trauma. Neurologic:  Normal speech and language. No gross focal neurologic deficits are appreciated. No gait instability noted. Skin:  Skin is warm, dry and intact. No rash noted. Psychiatric: Mood and affect are normal. Speech and behavior are normal.  ____________________________________________   LABS (all labs ordered are listed, but only abnormal results are displayed)  Labs Reviewed  BASIC METABOLIC PANEL - Abnormal; Notable for the following components:      Result Value   Glucose, Bld 122 (*)    Calcium 8.5 (*)    All other components within normal limits  CBC  URINALYSIS, COMPLETE (UACMP) WITH MICROSCOPIC  CBG MONITORING, ED   ____________________________________________  12 Lead EKG Sinus rhythm, rate of 91 bpm and normal axis.  Normal intervals.  No evidence of acute ischemia.  ____________________________________________  RADIOLOGY  ED MD interpretation: Diffuse interstitial infiltrates without focal consolidation.  Images obtained due to patient's history of COVID-19 positivity and symptoms of shortness of  breath.  Official radiology report(s): DG Chest 2 View  Result Date: 08/05/2019 CLINICAL DATA:  Shortness of breath. Additional provided: Positive testing for COVID last week, nonproductive cough, shortness of breath. EXAM: CHEST - 2 VIEW COMPARISON:  No pertinent prior exams are available for comparison. FINDINGS: Heart size within normal limits. Extensive interstitial and ill-defined opacity throughout both lungs likely reflecting sequela of atypical/viral pneumonia given the provided history. No evidence of pleural effusion or pneumothorax. No acute bony abnormality identified. IMPRESSION: Extensive interstitial and ill-defined opacities throughout both lungs likely reflecting sequela of atypical/viral pneumonia given the provided history. Electronically Signed   By: Kellie Simmering DO   On: 08/05/2019 13:45    ____________________________________________   PROCEDURES  Procedure(s) performed (including Critical Care):  Procedures   ____________________________________________   INITIAL IMPRESSION / ASSESSMENT AND PLAN / ED COURSE  54 year old unvaccinated male presenting with 8 days of COVID-19 symptoms, with associated hypoxia required nasal cannula and Decadron course, requiring admission for further work-up and management.  Patient hypoxic to the mid 80s on room air requiring 2 L nasal cannula.  Patient appears dehydrated and dyspneic on exam, but no respiratory distress.  Blood work unremarkable.  CXR with expected infiltrates, without evidence of coexisting bacterial lobar infiltration.  Provided Decadron due to the severity of his illness, and provided 1 L of LR due to his dehydration.  We will admit the patient to medicine for further work-up management.  Clinical Course as of Aug 05 1627  Tue Aug 05, 2019  1621 Spoke with admitting hospitalist, who agrees to admit the patient for further work-up and management   [DS]    Clinical Course User Index [DS] Vladimir Crofts, MD      ____________________________________________   FINAL CLINICAL IMPRESSION(S) / ED DIAGNOSES  Final diagnoses:  COVID-19  Generalized weakness  Shortness of breath  Dehydration     ED Discharge Orders    None       Kristan Votta Tamala Julian   Note:  This document was prepared using Dragon voice recognition software and may include unintentional dictation errors.   Vladimir Crofts, MD 08/05/19 1630

## 2019-08-05 NOTE — ED Notes (Signed)
Report given to [employer] on 1C at this time

## 2019-08-05 NOTE — ED Notes (Signed)
US at bedside

## 2019-08-05 NOTE — ED Triage Notes (Signed)
Patient reports testing positive for COVID last week. States he received his first infusion yesterday, and since then has had worsening weakness. Patient also reports non-productive cough and SOB. Patient states he feels like he is going to pass out. Patient is pale and diaphoretic. 84% on room air. Placed on 2L [place] with increase to 91%. Pt denies chest pain at this time.

## 2019-08-06 ENCOUNTER — Encounter: Payer: Self-pay | Admitting: Family Medicine

## 2019-08-06 LAB — C-REACTIVE PROTEIN: CRP: 19.6 mg/dL — ABNORMAL HIGH (ref <1.0)

## 2019-08-06 LAB — HIV ANTIBODY (ROUTINE TESTING W REFLEX): HIV Screen 4th Generation wRfx: NONREACTIVE

## 2019-08-06 LAB — FIBRIN DERIVATIVES D-DIMER (ARMC ONLY): Fibrin derivatives D-dimer (ARMC): 863.92 ng{FEU}/mL — ABNORMAL HIGH (ref 0.00–499.00)

## 2019-08-06 MED ORDER — METHYLPREDNISOLONE SODIUM SUCC 40 MG IJ SOLR
40.0000 mg | Freq: Two times a day (BID) | INTRAMUSCULAR | Status: DC
  Administered 2019-08-06 – 2019-08-11 (×11): 40 mg via INTRAVENOUS
  Filled 2019-08-06 (×11): qty 1

## 2019-08-06 NOTE — Progress Notes (Signed)
PROGRESS NOTE    Bruce Wang  XNA:[id number] DOB: 1965/05/22 DOA: 08/05/2019 PCP: [name], MD    Assessment & Plan:   Active Problems:   Pneumonia due to COVID-19 virus    Bruce Wang  is a 54 y.o. male with a known history of recent diagnosis of Covid-19 1 week ago.  He states that 2 family members are being released from [facility] today.  He stated he went for an antibody infusion.  He has been getting worse by the day.  He has had a runny nose, cough, shortness of breath, chest pain when he coughs.  The patient has had fever and chills throughout the entire course.  He has noticed some bruising of his lower extremities.  Feeling weak and having some weight loss.  Having diarrhea since he was diagnosed.  In the ER, triage nurse notices pulse ox of 85%.  Patient was placed on 2 L of oxygen.  Chest x-ray concerning for Covid pneumonia.  Hospitalist services were contacted for further evaluation.  The patient has not had the COVID-19 vaccination.   1.  Acute hypoxic respiratory failure secondary to COVID-19.  COVID-19 bilateral pneumonia. --Needed suppl O2 up to 15L today --Started on Decadron 6 mg daily and remdesivir on presentation PLAN: --continue remdesivir --increase steroid to solumedrol 40 mg BID due to elevated CRP --Vitamin C, zinc, thiamine and vitamin D --Combivent inhaler. --suppl O2 to maintain sat >=92%, wean as able  2.  Spontaneous bruising bilateral lower extremity with soreness in his calves.  ER physician ordered an ultrasound the lower extremity which was neg for DVT.  Current platelet count okay at 166.  INR wnl. 3.  Gout on allopurinol 4.  Allergic rhinitis on nasal sprays and Zyrtec.   DVT prophylaxis: Lovenox SQ Code Status: Full code  Family Communication:  Status is: inpatient Dispo:   The patient is from: home Anticipated d/c is to: home Anticipated d/c date is: at least 5 days Patient currently is not medically stable to d/c due to: COVID  treatment, on 15L O2   Subjective and Interval History:  Pt reported breathing improved a little with suppl O2.  Coughing.  No N/V/D.     Objective: Vitals:   08/06/19 0842 08/06/19 1122 08/06/19 1712 08/06/19 2029  BP: (!) 144/98 123/78 (!) 130/90 (!) 128/91  Pulse: 83 84 86 97  Resp: 18   19  Temp: 98.2 F (36.8 C) 98 F (36.7 C) 98.7 F (37.1 C) 99.2 F (37.3 C)  TempSrc: Oral Oral Oral Oral  SpO2: 96% 94% 93% 93%  Weight:      Height:       No intake or output data in the 24 hours ending 08/06/19 2142 Filed Weights   08/05/19 1252  Weight: (!) 127 kg    Examination:   Constitutional: NAD, AAOx3 HEENT: conjunctivae and lids normal, EOMI CV: RRR no M,R,G. Distal pulses +2.  No cyanosis.   RESP: reduced breath sounds, coughing, on 15L GI: +BS, NTND Extremities: No effusions, edema, or tenderness in BLE SKIN: warm, dry and intact Neuro: II - XII grossly intact.  Sensation intact Psych: Normal mood and affect.  Appropriate judgement and reason   Data Reviewed: I have personally reviewed following labs and imaging studies  CBC: Recent Labs  Lab 08/05/19 1255  WBC 4.0  HGB 13.9  HCT 43.1  MCV 92.1  PLT 542   Basic Metabolic Panel: Recent Labs  Lab 08/05/19 1255  NA 140  K 3.6  CL 106  CO2 23  GLUCOSE 122*  BUN 17  CREATININE 1.23  CALCIUM 8.5*   GFR: Estimated Creatinine Clearance: 96.8 mL/min (by C-G formula based on SCr of 1.23 mg/dL). Liver Function Tests: No results for input(s): AST, ALT, ALKPHOS, BILITOT, PROT, ALBUMIN in the last 168 hours. No results for input(s): LIPASE, AMYLASE in the last 168 hours. No results for input(s): AMMONIA in the last 168 hours. Coagulation Profile: Recent Labs  Lab 08/05/19 2225  INR 0.9   Cardiac Enzymes: No results for input(s): CKTOTAL, CKMB, CKMBINDEX, TROPONINI in the last 168 hours. BNP (last 3 results) No results for input(s): PROBNP in the last 8760 hours. HbA1C: No results for input(s):  HGBA1C in the last 72 hours. CBG: Recent Labs  Lab 08/04/19 0848  GLUCAP 140*   Lipid Profile: No results for input(s): CHOL, HDL, LDLCALC, TRIG, CHOLHDL, LDLDIRECT in the last 72 hours. Thyroid Function Tests: No results for input(s): TSH, T4TOTAL, FREET4, T3FREE, THYROIDAB in the last 72 hours. Anemia Panel: Recent Labs    08/05/19 2225  FERRITIN 346*   Sepsis Labs: Recent Labs  Lab 08/05/19 2225  PROCALCITON 0.42    Recent Results (from the past 240 hour(s))  Novel Coronavirus, NAA ([facility])     Status: Abnormal   Collection Time: 07/29/19  5:10 PM   Specimen: Nasopharyngeal(NP) swabs in vial transport medium   Nasopharynge  Result Value Ref Range Status   SARS-CoV-2, NAA Detected (A) Not Detected Final    Comment: Patients who have a positive COVID-19 test result may now have treatment options. Treatment options are available for patients with mild to moderate symptoms and for hospitalized patients. Visit our website at [email or web address] for resources and information. This nucleic acid amplification test was developed and its performance characteristics determined by [employer]. Nucleic acid amplification tests include RT-PCR and TMA. This test has not been FDA cleared or approved. This test has been authorized by FDA under an Emergency Use Authorization (EUA). This test is only authorized for the duration of time the declaration that circumstances exist justifying the authorization of the emergency use of in vitro diagnostic tests for detection of SARS-CoV-2 virus and/or diagnosis of COVID-19 infection under section 564(b)(1) of the Act, 21 U.S.C. 825KNL-9(J) (1), unless the authorization is terminated or revoked sooner. When diagnostic testing is negativ e, the possibility of a false negative result should be considered in the context of a patient's recent exposures and the presence of clinical signs and symptoms consistent with  COVID-19. An individual without symptoms of COVID-19 and who is not shedding SARS-CoV-2 virus would expect to have a negative (not detected) result in this assay.   SARS-COV-2, NAA 2 DAY TAT     Status: None   Collection Time: 07/29/19  5:10 PM   Nasopharynge  Result Value Ref Range Status   SARS-CoV-2, NAA 2 DAY TAT Performed  Final      Radiology Studies: DG Chest 2 View  Result Date: 08/05/2019 CLINICAL DATA:  Shortness of breath. Additional provided: Positive testing for COVID last week, nonproductive cough, shortness of breath. EXAM: CHEST - 2 VIEW COMPARISON:  No pertinent prior exams are available for comparison. FINDINGS: Heart size within normal limits. Extensive interstitial and ill-defined opacity throughout both lungs likely reflecting sequela of atypical/viral pneumonia given the provided history. No evidence of pleural effusion or pneumothorax. No acute bony abnormality identified. IMPRESSION: Extensive interstitial and ill-defined opacities throughout both lungs likely reflecting sequela of atypical/viral pneumonia given  the provided history. Electronically Signed   By: Kellie Simmering DO   On: 08/05/2019 13:45   US Venous Img Lower Bilateral  Result Date: 08/05/2019 CLINICAL DATA:  Initial evaluation for acute pain, tenderness, with discoloration. EXAM: BILATERAL LOWER EXTREMITY VENOUS DOPPLER ULTRASOUND TECHNIQUE: Gray-scale sonography with graded compression, as well as color Doppler and duplex ultrasound were performed to evaluate the lower extremity deep venous systems from the level of the common femoral vein and including the common femoral, femoral, profunda femoral, popliteal and calf veins including the posterior tibial, peroneal and gastrocnemius veins when visible. The superficial great saphenous vein was also interrogated. Spectral Doppler was utilized to evaluate flow at rest and with distal augmentation maneuvers in the common femoral, femoral and popliteal veins.  COMPARISON:  None. FINDINGS: RIGHT LOWER EXTREMITY Common Femoral Vein: No evidence of thrombus. Normal compressibility, respiratory phasicity and response to augmentation. Saphenofemoral Junction: No evidence of thrombus. Normal compressibility and flow on color Doppler imaging. Profunda Femoral Vein: No evidence of thrombus. Normal compressibility and flow on color Doppler imaging. Femoral Vein: No evidence of thrombus. Normal compressibility, respiratory phasicity and response to augmentation. Popliteal Vein: No evidence of thrombus. Normal compressibility, respiratory phasicity and response to augmentation. Calf Veins: No evidence of thrombus. Normal compressibility and flow on color Doppler imaging. Please note that the right peroneal vein is not visualized. Superficial Great Saphenous Vein: No evidence of thrombus. Normal compressibility. Venous Reflux:  None. Other Findings:  None. LEFT LOWER EXTREMITY Common Femoral Vein: No evidence of thrombus. Normal compressibility, respiratory phasicity and response to augmentation. Saphenofemoral Junction: No evidence of thrombus. Normal compressibility and flow on color Doppler imaging. Profunda Femoral Vein: No evidence of thrombus. Normal compressibility and flow on color Doppler imaging. Femoral Vein: No evidence of thrombus. Normal compressibility, respiratory phasicity and response to augmentation. Popliteal Vein: No evidence of thrombus. Normal compressibility, respiratory phasicity and response to augmentation. Calf Veins: No evidence of thrombus. Normal compressibility and flow on color Doppler imaging. Superficial Great Saphenous Vein: No evidence of thrombus. Normal compressibility. Venous Reflux:  None. Other Findings:  None. IMPRESSION: No evidence of deep venous thrombosis in either lower extremity. Electronically Signed   By: Jeannine Boga M.D.   On: 08/05/2019 19:51     Scheduled Meds:  allopurinol  300 mg Oral Daily   vitamin C  500 mg  Oral Daily   azelastine  2 spray Each Nare BID   cholecalciferol  2,000 Units Oral Daily   enoxaparin (LOVENOX) injection  40 mg Subcutaneous Q24H   fluticasone  1 spray Each Nare Daily   ipratropium  1 spray Each Nare QHS   Ipratropium-Albuterol  1 puff Inhalation Q6H   loratadine  10 mg Oral Daily   methylPREDNISolone (SOLU-MEDROL) injection  40 mg Intravenous Q12H   thiamine  100 mg Oral Daily   vitamin B-12  500 mcg Oral Q M,W,F   zinc sulfate  220 mg Oral Daily   Continuous Infusions:  remdesivir 100 mg in NS 100 mL 100 mg (08/06/19 1007)     LOS: 1 day     Enzo Bi, MD [facility] If 7PM-7AM, please contact night-coverage 08/06/2019, 9:42 PM

## 2019-08-07 LAB — PHOSPHORUS: Phosphorus: 3.2 mg/dL (ref 2.5–4.6)

## 2019-08-07 LAB — BASIC METABOLIC PANEL
Anion gap: 11 (ref 5–15)
BUN: 19 mg/dL (ref 6–20)
CO2: 27 mmol/L (ref 22–32)
Calcium: 8.7 mg/dL — ABNORMAL LOW (ref 8.9–10.3)
Chloride: 103 mmol/L (ref 98–111)
Creatinine, Ser: 0.85 mg/dL (ref 0.61–1.24)
GFR calc Af Amer: >60 mL/min (ref >60)
GFR calc non Af Amer: >60 mL/min (ref >60)
Glucose, Bld: 156 mg/dL — ABNORMAL HIGH (ref 70–99)
Potassium: 4.1 mmol/L (ref 3.5–5.1)
Sodium: 141 mmol/L (ref 135–145)

## 2019-08-07 LAB — CBC
HCT: 39.6 % (ref 39.0–52.0)
Hemoglobin: 13.5 g/dL (ref 13.0–17.0)
MCH: 30.3 pg (ref 26.0–34.0)
MCHC: 34.1 g/dL (ref 30.0–36.0)
MCV: 89 fL (ref 80.0–100.0)
Platelets: 217 10*3/uL (ref 150–400)
RBC: 4.45 MIL/uL (ref 4.22–5.81)
RDW: 12.8 % (ref 11.5–15.5)
WBC: 3.5 10*3/uL — ABNORMAL LOW (ref 4.0–10.5)
nRBC: 0 % (ref 0.0–0.2)

## 2019-08-07 LAB — C-REACTIVE PROTEIN: CRP: 6.3 mg/dL — ABNORMAL HIGH (ref <1.0)

## 2019-08-07 LAB — MAGNESIUM: Magnesium: 2.2 mg/dL (ref 1.7–2.4)

## 2019-08-07 LAB — FIBRIN DERIVATIVES D-DIMER (ARMC ONLY): Fibrin derivatives D-dimer (ARMC): 852.69 ng/mL (FEU) — ABNORMAL HIGH (ref 0.00–499.00)

## 2019-08-07 NOTE — Progress Notes (Signed)
PROGRESS NOTE    Bruce Wang  ZYS:[id number] DOB: 07/01/1965 DOA: 08/05/2019 PCP: [name], MD    Assessment & Plan:   Active Problems:   Pneumonia due to COVID-19 virus    Bruce Wang  is a 54 y.o. male with a known history of recent diagnosis of Covid-19 1 week ago.  He states that 2 family members are being released from [facility] today.  He stated he went for an antibody infusion.  He has been getting worse by the day.  He has had a runny nose, cough, shortness of breath, chest pain when he coughs.  The patient has had fever and chills throughout the entire course.  He has noticed some bruising of his lower extremities.  Feeling weak and having some weight loss.  Having diarrhea since he was diagnosed.  In the ER, triage nurse notices pulse ox of 85%.  Patient was placed on 2 L of oxygen.  Chest x-ray concerning for Covid pneumonia.  Hospitalist services were contacted for further evaluation.  The patient has not had the COVID-19 vaccination.   1.  Acute hypoxic respiratory failure secondary to COVID-19.  COVID-19 bilateral pneumonia. --Needed suppl O2 up to 15L today --Started on Decadron 6 mg daily and remdesivir on presentation PLAN: --continue remdesivir (started on 7/27) --continue solumedrol 40 mg BID due to elevated CRP --Vitamin C, zinc, thiamine and vitamin D --Combivent inhaler. --suppl O2 to maintain sat >=92%, wean as able (currently on 15L with no distress)  2.  Spontaneous bruising bilateral lower extremity with soreness in his calves.  ER physician ordered an ultrasound the lower extremity which was neg for DVT.  Current platelet count okay at 166.  INR wnl. 3.  Gout on allopurinol 4.  Allergic rhinitis on nasal sprays and Zyrtec.   DVT prophylaxis: Lovenox SQ Code Status: Full code  Family Communication:  Status is: inpatient Dispo:   The patient is from: home Anticipated d/c is to: home Anticipated d/c date is: at least 5 days Patient currently is not  medically stable to d/c due to: COVID treatment, on 15L O2   Subjective and Interval History:  Pt reported feeling a little better.  Felt sore in his nostrils due to the [place].  No N/V/D.    Objective: Vitals:   08/07/19 0841 08/07/19 1306 08/07/19 1437 08/07/19 1602  BP: (!) 132/96 (!) 134/91  (!) 137/90  Pulse: 88 89 102 94  Resp: 18 18 (!) 24 20  Temp: 98.7 F (37.1 C) 99 F (37.2 C)  98.7 F (37.1 C)  TempSrc: Oral Oral  Oral  SpO2: 91% 92% 91% 94%  Weight:      Height:        Intake/Output Summary (Last 24 hours) at 08/07/2019 1909 Last data filed at 08/07/2019 0848 Gross per 24 hour  Intake --  Output 950 ml  Net -950 ml   Filed Weights   08/05/19 1252  Weight: (!) 127 kg    Examination:   Constitutional: NAD, AAOx3 HEENT: conjunctivae and lids normal, EOMI CV: RRR no M,R,G. Distal pulses +2.  No cyanosis.   RESP: reduced breath sounds, on 15L GI: +BS, NTND Extremities: No effusions, edema, or tenderness in BLE SKIN: warm, dry and intact Neuro: II - XII grossly intact.  Sensation intact Psych: Normal mood and affect.  Appropriate judgement and reason   Data Reviewed: I have personally reviewed following labs and imaging studies  CBC: Recent Labs  Lab 08/05/19 1255 08/07/19 0526  WBC 4.0 3.5*  HGB 13.9 13.5  HCT 43.1 39.6  MCV 92.1 89.0  PLT 166 694   Basic Metabolic Panel: Recent Labs  Lab 08/05/19 1255 08/07/19 0526  NA 140 141  K 3.6 4.1  CL 106 103  CO2 23 27  GLUCOSE 122* 156*  BUN 17 19  CREATININE 1.23 0.85  CALCIUM 8.5* 8.7*  MG  --  2.2  PHOS  --  3.2   GFR: Estimated Creatinine Clearance: 140.1 mL/min (by C-G formula based on SCr of 0.85 mg/dL). Liver Function Tests: No results for input(s): AST, ALT, ALKPHOS, BILITOT, PROT, ALBUMIN in the last 168 hours. No results for input(s): LIPASE, AMYLASE in the last 168 hours. No results for input(s): AMMONIA in the last 168 hours. Coagulation Profile: Recent Labs  Lab  08/05/19 2225  INR 0.9   Cardiac Enzymes: No results for input(s): CKTOTAL, CKMB, CKMBINDEX, TROPONINI in the last 168 hours. BNP (last 3 results) No results for input(s): PROBNP in the last 8760 hours. HbA1C: No results for input(s): HGBA1C in the last 72 hours. CBG: Recent Labs  Lab 08/04/19 0848  GLUCAP 140*   Lipid Profile: No results for input(s): CHOL, HDL, LDLCALC, TRIG, CHOLHDL, LDLDIRECT in the last 72 hours. Thyroid Function Tests: No results for input(s): TSH, T4TOTAL, FREET4, T3FREE, THYROIDAB in the last 72 hours. Anemia Panel: Recent Labs    08/05/19 2225  FERRITIN 346*   Sepsis Labs: Recent Labs  Lab 08/05/19 2225  PROCALCITON 0.42    Recent Results (from the past 240 hour(s))  Novel Coronavirus, NAA ([facility])     Status: Abnormal   Collection Time: 07/29/19  5:10 PM   Specimen: Nasopharyngeal(NP) swabs in vial transport medium   Nasopharynge  Result Value Ref Range Status   SARS-CoV-2, NAA Detected (A) Not Detected Final    Comment: Patients who have a positive COVID-19 test result may now have treatment options. Treatment options are available for patients with mild to moderate symptoms and for hospitalized patients. Visit our website at [email or web address] for resources and information. This nucleic acid amplification test was developed and its performance characteristics determined by [employer]. Nucleic acid amplification tests include RT-PCR and TMA. This test has not been FDA cleared or approved. This test has been authorized by FDA under an Emergency Use Authorization (EUA). This test is only authorized for the duration of time the declaration that circumstances exist justifying the authorization of the emergency use of in vitro diagnostic tests for detection of SARS-CoV-2 virus and/or diagnosis of COVID-19 infection under section 564(b)(1) of the Act, 21 U.S.C. 854OEV-0(J) (1), unless the authorization is terminated  or revoked sooner. When diagnostic testing is negativ e, the possibility of a false negative result should be considered in the context of a patient's recent exposures and the presence of clinical signs and symptoms consistent with COVID-19. An individual without symptoms of COVID-19 and who is not shedding SARS-CoV-2 virus would expect to have a negative (not detected) result in this assay.   SARS-COV-2, NAA 2 DAY TAT     Status: None   Collection Time: 07/29/19  5:10 PM   Nasopharynge  Result Value Ref Range Status   SARS-CoV-2, NAA 2 DAY TAT Performed  Final      Radiology Studies: US Venous Img Lower Bilateral  Result Date: 08/05/2019 CLINICAL DATA:  Initial evaluation for acute pain, tenderness, with discoloration. EXAM: BILATERAL LOWER EXTREMITY VENOUS DOPPLER ULTRASOUND TECHNIQUE: Gray-scale sonography with graded compression, as well as color Doppler  and duplex ultrasound were performed to evaluate the lower extremity deep venous systems from the level of the common femoral vein and including the common femoral, femoral, profunda femoral, popliteal and calf veins including the posterior tibial, peroneal and gastrocnemius veins when visible. The superficial great saphenous vein was also interrogated. Spectral Doppler was utilized to evaluate flow at rest and with distal augmentation maneuvers in the common femoral, femoral and popliteal veins. COMPARISON:  None. FINDINGS: RIGHT LOWER EXTREMITY Common Femoral Vein: No evidence of thrombus. Normal compressibility, respiratory phasicity and response to augmentation. Saphenofemoral Junction: No evidence of thrombus. Normal compressibility and flow on color Doppler imaging. Profunda Femoral Vein: No evidence of thrombus. Normal compressibility and flow on color Doppler imaging. Femoral Vein: No evidence of thrombus. Normal compressibility, respiratory phasicity and response to augmentation. Popliteal Vein: No evidence of thrombus. Normal  compressibility, respiratory phasicity and response to augmentation. Calf Veins: No evidence of thrombus. Normal compressibility and flow on color Doppler imaging. Please note that the right peroneal vein is not visualized. Superficial Great Saphenous Vein: No evidence of thrombus. Normal compressibility. Venous Reflux:  None. Other Findings:  None. LEFT LOWER EXTREMITY Common Femoral Vein: No evidence of thrombus. Normal compressibility, respiratory phasicity and response to augmentation. Saphenofemoral Junction: No evidence of thrombus. Normal compressibility and flow on color Doppler imaging. Profunda Femoral Vein: No evidence of thrombus. Normal compressibility and flow on color Doppler imaging. Femoral Vein: No evidence of thrombus. Normal compressibility, respiratory phasicity and response to augmentation. Popliteal Vein: No evidence of thrombus. Normal compressibility, respiratory phasicity and response to augmentation. Calf Veins: No evidence of thrombus. Normal compressibility and flow on color Doppler imaging. Superficial Great Saphenous Vein: No evidence of thrombus. Normal compressibility. Venous Reflux:  None. Other Findings:  None. IMPRESSION: No evidence of deep venous thrombosis in either lower extremity. Electronically Signed   By: Jeannine Boga M.D.   On: 08/05/2019 19:51     Scheduled Meds:  allopurinol  300 mg Oral Daily   vitamin C  500 mg Oral Daily   azelastine  2 spray Each Nare BID   cholecalciferol  2,000 Units Oral Daily   enoxaparin (LOVENOX) injection  40 mg Subcutaneous Q24H   fluticasone  1 spray Each Nare Daily   ipratropium  1 spray Each Nare QHS   Ipratropium-Albuterol  1 puff Inhalation Q6H   loratadine  10 mg Oral Daily   methylPREDNISolone (SOLU-MEDROL) injection  40 mg Intravenous Q12H   thiamine  100 mg Oral Daily   vitamin B-12  500 mcg Oral Q M,W,F   zinc sulfate  220 mg Oral Daily   Continuous Infusions:  remdesivir 100 mg in NS 100  mL 100 mg (08/07/19 1023)     LOS: 2 days     Enzo Bi, MD [facility] If 7PM-7AM, please contact night-coverage 08/07/2019, 7:09 PM

## 2019-08-08 LAB — CBC
HCT: 39.7 % (ref 39.0–52.0)
Hemoglobin: 13.5 g/dL (ref 13.0–17.0)
MCH: 30.2 pg (ref 26.0–34.0)
MCHC: 34 g/dL (ref 30.0–36.0)
MCV: 88.8 fL (ref 80.0–100.0)
Platelets: 269 10*3/uL (ref 150–400)
RBC: 4.47 MIL/uL (ref 4.22–5.81)
RDW: 12.5 % (ref 11.5–15.5)
WBC: 5.9 10*3/uL (ref 4.0–10.5)
nRBC: 0 % (ref 0.0–0.2)

## 2019-08-08 LAB — MAGNESIUM: Magnesium: 2.3 mg/dL (ref 1.7–2.4)

## 2019-08-08 LAB — BASIC METABOLIC PANEL
Anion gap: 10 (ref 5–15)
BUN: 22 mg/dL — ABNORMAL HIGH (ref 6–20)
CO2: 28 mmol/L (ref 22–32)
Calcium: 8.6 mg/dL — ABNORMAL LOW (ref 8.9–10.3)
Chloride: 103 mmol/L (ref 98–111)
Creatinine, Ser: 0.94 mg/dL (ref 0.61–1.24)
GFR calc Af Amer: >60 mL/min (ref >60)
GFR calc non Af Amer: >60 mL/min (ref >60)
Glucose, Bld: 135 mg/dL — ABNORMAL HIGH (ref 70–99)
Potassium: 3.8 mmol/L (ref 3.5–5.1)
Sodium: 141 mmol/L (ref 135–145)

## 2019-08-08 LAB — C-REACTIVE PROTEIN: CRP: 2.3 mg/dL — ABNORMAL HIGH (ref <1.0)

## 2019-08-08 NOTE — Progress Notes (Signed)
PROGRESS NOTE    Bruce Wang  WYO:[id number] DOB: [birth year] DOA: 08/05/2019 PCP: [name], MD    Assessment & Plan:   Active Problems:   Pneumonia due to COVID-19 virus    Bruce Wang  is a 54 y.o. male with a known history of recent diagnosis of Covid-19 1 week ago.  He states that 2 family members are being released from [facility] today.  He stated he went for an antibody infusion.  He has been getting worse by the day.  He has had a runny nose, cough, shortness of breath, chest pain when he coughs.  The patient has had fever and chills throughout the entire course.  He has noticed some bruising of his lower extremities.  Feeling weak and having some weight loss.  Having diarrhea since he was diagnosed.  In the ER, triage nurse notices pulse ox of 85%.  Patient was placed on 2 L of oxygen.  Chest x-ray concerning for Covid pneumonia.  Hospitalist services were contacted for further evaluation.  The patient has not had the COVID-19 vaccination.   1.  Acute hypoxic respiratory failure secondary to COVID-19 PNA. --Needed suppl O2 up to 15L  --Started on Decadron 6 mg daily and remdesivir on presentation PLAN: --continue remdesivir (started on 7/27) --continue solumedrol 40 mg BID due to elevated CRP --Vitamin C, zinc, thiamine and vitamin D --Combivent inhaler. --suppl O2 to maintain sat >=92%, wean as able (currently on 15L with no distress)  2.  Spontaneous bruising bilateral lower extremity with soreness in his calves.  ER physician ordered an ultrasound the lower extremity which was neg for DVT.  Current platelet count okay at 166.  INR wnl. 3.  Gout on allopurinol 4.  Allergic rhinitis on nasal sprays and Zyrtec.   DVT prophylaxis: Lovenox SQ Code Status: Full code  Family Communication:  Status is: inpatient Dispo:   The patient is from: home Anticipated d/c is to: home Anticipated d/c date is: at least 5 days Patient currently is not medically stable to d/c due to:  COVID treatment, on 15L O2   Subjective and Interval History:  Pt reported improved dyspnea and breathing.  No fever.  Normal oral intake.     Objective: Vitals:   08/08/19 1636 08/08/19 1651 08/08/19 2119 08/09/19 0047  BP:  (!) 143/99 (!) 128/86 (!) 122/88  Pulse:  84 79 82  Resp:  18 16 17   Temp:  98.3 F (36.8 C) 98 F (36.7 C) 98.6 F (37 C)  TempSrc:  Oral Oral Oral  SpO2: 93% 96% 97% 94%  Weight:      Height:        Intake/Output Summary (Last 24 hours) at 08/09/2019 0424 Last data filed at 08/08/2019 1600 Gross per 24 hour  Intake 317.94 ml  Output 1000 ml  Net -682.06 ml   Filed Weights   08/05/19 1252  Weight: (!) 127 kg    Examination:   Constitutional: NAD, AAOx3 HEENT: conjunctivae and lids normal, EOMI CV: RRR no M,R,G. Distal pulses +2.  No cyanosis.   RESP: reduced breath sounds, on 15L GI: +BS, NTND Extremities: No effusions, edema, or tenderness in BLE SKIN: warm, dry and intact Neuro: II - XII grossly intact.  Sensation intact Psych: Normal mood and affect.  Appropriate judgement and reason   Data Reviewed: I have personally reviewed following labs and imaging studies  CBC: Recent Labs  Lab 08/05/19 1255 08/07/19 0526 08/08/19 0449  WBC 4.0 3.5* 5.9  HGB 13.9 13.5 13.5  HCT 43.1 39.6 39.7  MCV 92.1 89.0 88.8  PLT 166 217 979   Basic Metabolic Panel: Recent Labs  Lab 08/05/19 1255 08/07/19 0526 08/08/19 0449  NA 140 141 141  K 3.6 4.1 3.8  CL 106 103 103  CO2 23 27 28   GLUCOSE 122* 156* 135*  BUN 17 19 22*  CREATININE 1.23 0.85 0.94  CALCIUM 8.5* 8.7* 8.6*  MG  --  2.2 2.3  PHOS  --  3.2  --    GFR: Estimated Creatinine Clearance: 126.6 mL/min (by C-G formula based on SCr of 0.94 mg/dL). Liver Function Tests: No results for input(s): AST, ALT, ALKPHOS, BILITOT, PROT, ALBUMIN in the last 168 hours. No results for input(s): LIPASE, AMYLASE in the last 168 hours. No results for input(s): AMMONIA in the last 168  hours. Coagulation Profile: Recent Labs  Lab 08/05/19 2225  INR 0.9   Cardiac Enzymes: No results for input(s): CKTOTAL, CKMB, CKMBINDEX, TROPONINI in the last 168 hours. BNP (last 3 results) No results for input(s): PROBNP in the last 8760 hours. HbA1C: No results for input(s): HGBA1C in the last 72 hours. CBG: Recent Labs  Lab 08/04/19 0848  GLUCAP 140*   Lipid Profile: No results for input(s): CHOL, HDL, LDLCALC, TRIG, CHOLHDL, LDLDIRECT in the last 72 hours. Thyroid Function Tests: No results for input(s): TSH, T4TOTAL, FREET4, T3FREE, THYROIDAB in the last 72 hours. Anemia Panel: No results for input(s): VITAMINB12, FOLATE, FERRITIN, TIBC, IRON, RETICCTPCT in the last 72 hours. Sepsis Labs: Recent Labs  Lab 08/05/19 2225  PROCALCITON 0.42    No results found for this or any previous visit (from the past 240 hour(s)).    Radiology Studies: No results found.   Scheduled Meds: . allopurinol  300 mg Oral Daily  . vitamin C  500 mg Oral Daily  . azelastine  2 spray Each Nare BID  . cholecalciferol  2,000 Units Oral Daily  . enoxaparin (LOVENOX) injection  40 mg Subcutaneous Q24H  . fluticasone  1 spray Each Nare Daily  . ipratropium  1 spray Each Nare QHS  . Ipratropium-Albuterol  1 puff Inhalation Q6H  . loratadine  10 mg Oral Daily  . methylPREDNISolone (SOLU-MEDROL) injection  40 mg Intravenous Q12H  . thiamine  100 mg Oral Daily  . vitamin B-12  500 mcg Oral Q M,W,F  . zinc sulfate  220 mg Oral Daily   Continuous Infusions: . remdesivir 100 mg in NS 100 mL Stopped (08/08/19 1045)     LOS: 4 days     Enzo Bi, MD [facility] If 7PM-7AM, please contact night-coverage 08/09/2019, 4:24 AM

## 2019-08-09 DIAGNOSIS — R0602 Shortness of breath: Secondary | ICD-10-CM

## 2019-08-09 DIAGNOSIS — R531 Weakness: Secondary | ICD-10-CM

## 2019-08-09 LAB — BASIC METABOLIC PANEL
Anion gap: 11 (ref 5–15)
BUN: 21 mg/dL — ABNORMAL HIGH (ref 6–20)
CO2: 24 mmol/L (ref 22–32)
Calcium: 8.7 mg/dL — ABNORMAL LOW (ref 8.9–10.3)
Chloride: 106 mmol/L (ref 98–111)
Creatinine, Ser: 0.88 mg/dL (ref 0.61–1.24)
GFR calc Af Amer: >60 mL/min (ref >60)
GFR calc non Af Amer: >60 mL/min (ref >60)
Glucose, Bld: 166 mg/dL — ABNORMAL HIGH (ref 70–99)
Potassium: 3.8 mmol/L (ref 3.5–5.1)
Sodium: 141 mmol/L (ref 135–145)

## 2019-08-09 LAB — CBC
HCT: 38.7 % — ABNORMAL LOW (ref 39.0–52.0)
Hemoglobin: 13.1 g/dL (ref 13.0–17.0)
MCH: 30 pg (ref 26.0–34.0)
MCHC: 33.9 g/dL (ref 30.0–36.0)
MCV: 88.6 fL (ref 80.0–100.0)
Platelets: 309 10*3/uL (ref 150–400)
RBC: 4.37 MIL/uL (ref 4.22–5.81)
RDW: 12.6 % (ref 11.5–15.5)
WBC: 5.9 10*3/uL (ref 4.0–10.5)
nRBC: 0 % (ref 0.0–0.2)

## 2019-08-09 LAB — MAGNESIUM: Magnesium: 2.4 mg/dL (ref 1.7–2.4)

## 2019-08-09 LAB — PROCALCITONIN: Procalcitonin: <0.1 ng/mL

## 2019-08-09 LAB — C-REACTIVE PROTEIN: CRP: 0.9 mg/dL (ref <1.0)

## 2019-08-09 MED ORDER — SODIUM CHLORIDE 0.9 % IV SOLN
500.0000 mg | INTRAVENOUS | Status: DC
  Administered 2019-08-09 – 2019-08-11 (×3): 500 mg via INTRAVENOUS
  Filled 2019-08-09 (×5): qty 500

## 2019-08-09 MED ORDER — SODIUM CHLORIDE 0.9 % IV SOLN
2.0000 g | INTRAVENOUS | Status: DC
  Administered 2019-08-09 – 2019-08-11 (×3): 2 g via INTRAVENOUS
  Filled 2019-08-09: qty 20
  Filled 2019-08-09: qty 2
  Filled 2019-08-09: qty 20
  Filled 2019-08-09: qty 2

## 2019-08-09 NOTE — Progress Notes (Signed)
PROGRESS NOTE    Bruce Wang  ZOX:[id number] DOB: 07/14/1965 DOA: 08/05/2019 PCP: [name], MD   Brief Narrative:  [name] a53 y.o.malewith a known history of recent diagnosis of Covid-191 week ago.  Received antibody infusion.  Came to ED with worsening cough, shortness of breath and pleuritic chest pain. On arrival he was hypoxic requiring 2 L which increased up to 15 L via HFNC. Chest x-ray with COVID-19 pneumonia. Completed remdesivir on 08/09/2019.  Continue to require higher levels of oxygen.  No Actemra given.  Unvaccinated.  Subjective: Patient continue to feel short of breath, which increases even with moving around in the bed.  Assessment & Plan:   Active Problems:   Pneumonia due to COVID-19 virus  Acute hypoxic respiratory failure secondary to COVID-19 pneumonia. Patient completed the course of remdesivir today. Decadron was switched with Solu-Medrol due to worsening hypoxia. Saturating around 90% on 12 L with HFNC. Procalcitonin was elevated at 0.49 on 08/05/2019. -Recheck procalcitonin. -Start him on ceftriaxone and azithromycin due to elevated initial procalcitonin for superadded bacterial infection as patient continued to require high oxygen. -Continue Solu-Medrol. -Continue supportive care. -Try to wean him off from oxygen as tolerated. -Continue to monitor inflammatory markers.  History of gout.  No acute concern. -Continue home dose of allopurinol.  History of allergic rhinitis. -Continue home nasal sprays and Zyrtec.  Spontaneous lower extremity bruising.  Resolved.  Platelets remain within normal limit.  Lower extremity Doppler was negative for DVT.  Objective: Vitals:   08/09/19 0519 08/09/19 0732 08/09/19 1257 08/09/19 1454  BP: (!) 119/86 120/71 113/77   Pulse: 72 85 87   Resp: 16 17    Temp: 97.7 F (36.5 C) 98.5 F (36.9 C) 98.3 F (36.8 C)   TempSrc: Oral Oral Oral   SpO2: 96% 95% 95% 94%  Weight:      Height:         Intake/Output Summary (Last 24 hours) at 08/09/2019 1552 Last data filed at 08/08/2019 1600 Gross per 24 hour  Intake 317.94 ml  Output --  Net 317.94 ml   Filed Weights   08/05/19 1252  Weight: (!) 127 kg    Examination:  General exam: Well developed, obese gentleman, appears calm and comfortable  Respiratory system: Clear to auscultation. Respiratory effort normal. Cardiovascular system: S1 & S2 heard, RRR. Gastrointestinal system: Soft, nontender, nondistended, bowel sounds positive. Central nervous system: Alert and oriented. No focal neurological deficits. Extremities: No edema, no cyanosis, pulses intact and symmetrical. Psychiatry: Judgement and insight appear normal. Mood & affect appropriate.    DVT prophylaxis: Lovenox Code Status: Full Family Communication: Discussed with patient. Disposition Plan:  Status is: Inpatient  Remains inpatient appropriate because:Inpatient level of care appropriate due to severity of illness   Dispo: The patient is from: Home              Anticipated d/c is to: Home              Anticipated d/c date is: 2 days              Patient currently is not medically stable to d/c.  Patient continued to require high level of oxygen.  Can be discharged home on oxygen if needed but should be able to wean off from HFNC.   Consultants:   None  Procedures:  Antimicrobials:  Ceftriaxone Azithromycin  Data Reviewed: I have personally reviewed following labs and imaging studies  CBC: Recent Labs  Lab 08/05/19 1255 08/07/19  3785 08/08/19 0449 08/09/19 0742  WBC 4.0 3.5* 5.9 5.9  HGB 13.9 13.5 13.5 13.1  HCT 43.1 39.6 39.7 38.7*  MCV 92.1 89.0 88.8 88.6  PLT 166 217 269 885   Basic Metabolic Panel: Recent Labs  Lab 08/05/19 1255 08/07/19 0526 08/08/19 0449 08/09/19 0742  NA 140 141 141 141  K 3.6 4.1 3.8 3.8  CL 106 103 103 106  CO2 23 27 28 24   GLUCOSE 122* 156* 135* 166*  BUN 17 19 22* 21*  CREATININE 1.23 0.85 0.94  0.88  CALCIUM 8.5* 8.7* 8.6* 8.7*  MG  --  2.2 2.3 2.4  PHOS  --  3.2  --   --    GFR: Estimated Creatinine Clearance: 135.3 mL/min (by C-G formula based on SCr of 0.88 mg/dL). Liver Function Tests: No results for input(s): AST, ALT, ALKPHOS, BILITOT, PROT, ALBUMIN in the last 168 hours. No results for input(s): LIPASE, AMYLASE in the last 168 hours. No results for input(s): AMMONIA in the last 168 hours. Coagulation Profile: Recent Labs  Lab 08/05/19 2225  INR 0.9   Cardiac Enzymes: No results for input(s): CKTOTAL, CKMB, CKMBINDEX, TROPONINI in the last 168 hours. BNP (last 3 results) No results for input(s): PROBNP in the last 8760 hours. HbA1C: No results for input(s): HGBA1C in the last 72 hours. CBG: Recent Labs  Lab 08/04/19 0848  GLUCAP 140*   Lipid Profile: No results for input(s): CHOL, HDL, LDLCALC, TRIG, CHOLHDL, LDLDIRECT in the last 72 hours. Thyroid Function Tests: No results for input(s): TSH, T4TOTAL, FREET4, T3FREE, THYROIDAB in the last 72 hours. Anemia Panel: No results for input(s): VITAMINB12, FOLATE, FERRITIN, TIBC, IRON, RETICCTPCT in the last 72 hours. Sepsis Labs: Recent Labs  Lab 08/05/19 2225  PROCALCITON 0.42    No results found for this or any previous visit (from the past 240 hour(s)).   Radiology Studies: No results found.  Scheduled Meds: . allopurinol  300 mg Oral Daily  . vitamin C  500 mg Oral Daily  . azelastine  2 spray Each Nare BID  . cholecalciferol  2,000 Units Oral Daily  . enoxaparin (LOVENOX) injection  40 mg Subcutaneous Q24H  . fluticasone  1 spray Each Nare Daily  . ipratropium  1 spray Each Nare QHS  . Ipratropium-Albuterol  1 puff Inhalation Q6H  . loratadine  10 mg Oral Daily  . methylPREDNISolone (SOLU-MEDROL) injection  40 mg Intravenous Q12H  . thiamine  100 mg Oral Daily  . vitamin B-12  500 mcg Oral Q M,W,F  . zinc sulfate  220 mg Oral Daily   Continuous Infusions:   LOS: 4 days   Time spent: 45  minutes.  Lorella Nimrod, MD [facility]  If 7PM-7AM, please contact night-coverage [email or web address]  08/09/2019, 3:52 PM   This record has been created using Systems analyst. Errors have been sought and corrected,but may not always be located. Such creation errors do not reflect on the standard of care.

## 2019-08-10 LAB — CBC
HCT: 39.8 % (ref 39.0–52.0)
Hemoglobin: 13 g/dL (ref 13.0–17.0)
MCH: 29.6 pg (ref 26.0–34.0)
MCHC: 32.7 g/dL (ref 30.0–36.0)
MCV: 90.7 fL (ref 80.0–100.0)
Platelets: 374 10*3/uL (ref 150–400)
RBC: 4.39 MIL/uL (ref 4.22–5.81)
RDW: 12.5 % (ref 11.5–15.5)
WBC: 7.5 10*3/uL (ref 4.0–10.5)
nRBC: 0 % (ref 0.0–0.2)

## 2019-08-10 LAB — BASIC METABOLIC PANEL
Anion gap: 9 (ref 5–15)
BUN: 15 mg/dL (ref 6–20)
CO2: 24 mmol/L (ref 22–32)
Calcium: 8.3 mg/dL — ABNORMAL LOW (ref 8.9–10.3)
Chloride: 105 mmol/L (ref 98–111)
Creatinine, Ser: 0.8 mg/dL (ref 0.61–1.24)
GFR calc Af Amer: >60 mL/min (ref >60)
GFR calc non Af Amer: >60 mL/min (ref >60)
Glucose, Bld: 128 mg/dL — ABNORMAL HIGH (ref 70–99)
Potassium: 3.6 mmol/L (ref 3.5–5.1)
Sodium: 138 mmol/L (ref 135–145)

## 2019-08-10 LAB — MAGNESIUM: Magnesium: 2.3 mg/dL (ref 1.7–2.4)

## 2019-08-10 LAB — FIBRIN DERIVATIVES D-DIMER (ARMC ONLY): Fibrin derivatives D-dimer (ARMC): 659.34 ng/mL (FEU) — ABNORMAL HIGH (ref 0.00–499.00)

## 2019-08-10 LAB — PROCALCITONIN: Procalcitonin: <0.1 ng/mL

## 2019-08-10 LAB — C-REACTIVE PROTEIN: CRP: 1 mg/dL — ABNORMAL HIGH (ref <1.0)

## 2019-08-10 MED ORDER — DIPHENHYDRAMINE HCL 25 MG PO CAPS
25.0000 mg | ORAL_CAPSULE | Freq: Four times a day (QID) | ORAL | Status: DC | PRN
  Administered 2019-08-10: 25 mg via ORAL
  Filled 2019-08-10: qty 1

## 2019-08-10 NOTE — Progress Notes (Signed)
PROGRESS NOTE    Bruce Wang  ZOX:[id number] DOB: 27-Jan-1965 DOA: 08/05/2019 PCP: [name], MD   Brief Narrative:  [name] a53 y.o.malewith a known history of recent diagnosis of Covid-191 week ago.  Received antibody infusion.  Came to ED with worsening cough, shortness of breath and pleuritic chest pain. On arrival he was hypoxic requiring 2 L which increased up to 15 L via HFNC. Chest x-ray with COVID-19 pneumonia. Completed remdesivir on 08/09/2019.  Continue to require higher levels of oxygen.  No Actemra given.  Unvaccinated.  Subjective: Patient wants to go home, stating that he cannot sleep in this noisy room.  Still short of breath with minimum exertion.  Assessment & Plan:   Active Problems:   Generalized weakness   COVID-19   Shortness of breath  Acute hypoxic respiratory failure secondary to COVID-19 pneumonia. Patient completed the course of remdesivir on 08/09/2019. Decadron was switched with Solu-Medrol due to worsening hypoxia. Saturating in mid 90s on 7L this morning, I decreased oxygen to 6 L with no change in saturation. Procalcitonin was elevated at 0.49 on 08/05/2019. - procalcitonin-within normal limit today. -Continue ceftriaxone and azithromycin which was started due to elevated initial procalcitonin for superadded bacterial infection as patient continued to require high oxygen. -Continue Solu-Medrol. -Continue supportive care. -Try to wean him off from oxygen as tolerated-patient did show some improvement in oxygen requirement. -Patient might need oxygen on discharge. -Continue to monitor inflammatory markers-continue to improve.  History of gout.  No acute concern. -Continue home dose of allopurinol.  History of allergic rhinitis. -Continue home nasal sprays and Zyrtec.  Spontaneous lower extremity bruising.  Resolved.  Platelets remain within normal limit.  Lower extremity Doppler was negative for DVT.  Objective: Vitals:    08/10/19 0628 08/10/19 0911 08/10/19 1156 08/10/19 1232  BP:  118/80  (!) 124/98  Pulse:  77  89  Resp:  18  20  Temp:  (!) 97.4 F (36.3 C)  98.6 F (37 C)  TempSrc:  Oral  Oral  SpO2: 92% 95% 92% 95%  Weight:      Height:        Intake/Output Summary (Last 24 hours) at 08/10/2019 1507 Last data filed at 08/10/2019 0000 Gross per 24 hour  Intake 350.11 ml  Output --  Net 350.11 ml   Filed Weights   08/05/19 1252  Weight: (!) 127 kg    Examination:  General.  Well-developed, well-nourished gentleman, in no acute distress. Pulmonary.  Bilateral scattered dry crackles with few inspiratory wheeze, normal work of breathing. CV.  Regular rate and rhythm, no JVD, rub or murmur. Abdomen.  Soft, nontender, nondistended, BS positive. CNS.  Alert and oriented x3.  No focal neurologic deficit. Extremities.  No edema, no cyanosis, pulses intact and symmetrical. Psychiatry.  Judgment and insight appears normal.  DVT prophylaxis: Lovenox Code Status: Full Family Communication: Discussed with patient. Disposition Plan:  Status is: Inpatient  Remains inpatient appropriate because:Inpatient level of care appropriate due to severity of illness   Dispo: The patient is from: Home              Anticipated d/c is to: Home              Anticipated d/c date is: 2 days              Patient currently is not medically stable to d/c.  Patient still requiring 6 L, although off from HFNC. can be discharged home on oxygen if  needed, would like to wean him off little more.  Consultants:   None  Procedures:  Antimicrobials:  Ceftriaxone Azithromycin  Data Reviewed: I have personally reviewed following labs and imaging studies  CBC: Recent Labs  Lab 08/05/19 1255 08/07/19 0526 08/08/19 0449 08/09/19 0742 08/10/19 0622  WBC 4.0 3.5* 5.9 5.9 7.5  HGB 13.9 13.5 13.5 13.1 13.0  HCT 43.1 39.6 39.7 38.7* 39.8  MCV 92.1 89.0 88.8 88.6 90.7  PLT 166 217 269 309 371   Basic Metabolic  Panel: Recent Labs  Lab 08/05/19 1255 08/07/19 0526 08/08/19 0449 08/09/19 0742 08/10/19 0622  NA 140 141 141 141 138  K 3.6 4.1 3.8 3.8 3.6  CL 106 103 103 106 105  CO2 23 27 28 24 24   GLUCOSE 122* 156* 135* 166* 128*  BUN 17 19 22* 21* 15  CREATININE 1.23 0.85 0.94 0.88 0.80  CALCIUM 8.5* 8.7* 8.6* 8.7* 8.3*  MG  --  2.2 2.3 2.4 2.3  PHOS  --  3.2  --   --   --    GFR: Estimated Creatinine Clearance: 148.8 mL/min (by C-G formula based on SCr of 0.8 mg/dL). Liver Function Tests: No results for input(s): AST, ALT, ALKPHOS, BILITOT, PROT, ALBUMIN in the last 168 hours. No results for input(s): LIPASE, AMYLASE in the last 168 hours. No results for input(s): AMMONIA in the last 168 hours. Coagulation Profile: Recent Labs  Lab 08/05/19 2225  INR 0.9   Cardiac Enzymes: No results for input(s): CKTOTAL, CKMB, CKMBINDEX, TROPONINI in the last 168 hours. BNP (last 3 results) No results for input(s): PROBNP in the last 8760 hours. HbA1C: No results for input(s): HGBA1C in the last 72 hours. CBG: Recent Labs  Lab 08/04/19 0848  GLUCAP 140*   Lipid Profile: No results for input(s): CHOL, HDL, LDLCALC, TRIG, CHOLHDL, LDLDIRECT in the last 72 hours. Thyroid Function Tests: No results for input(s): TSH, T4TOTAL, FREET4, T3FREE, THYROIDAB in the last 72 hours. Anemia Panel: No results for input(s): VITAMINB12, FOLATE, FERRITIN, TIBC, IRON, RETICCTPCT in the last 72 hours. Sepsis Labs: Recent Labs  Lab 08/05/19 2225 08/09/19 0742 08/10/19 0622  PROCALCITON 0.42 <0.10 <0.10    No results found for this or any previous visit (from the past 240 hour(s)).   Radiology Studies: No results found.  Scheduled Meds:  allopurinol  300 mg Oral Daily   vitamin C  500 mg Oral Daily   azelastine  2 spray Each Nare BID   cholecalciferol  2,000 Units Oral Daily   enoxaparin (LOVENOX) injection  40 mg Subcutaneous Q24H   fluticasone  1 spray Each Nare Daily   ipratropium  1  spray Each Nare QHS   Ipratropium-Albuterol  1 puff Inhalation Q6H   loratadine  10 mg Oral Daily   methylPREDNISolone (SOLU-MEDROL) injection  40 mg Intravenous Q12H   thiamine  100 mg Oral Daily   vitamin B-12  500 mcg Oral Q M,W,F   zinc sulfate  220 mg Oral Daily   Continuous Infusions:  azithromycin Stopped (08/09/19 1932)   cefTRIAXone (ROCEPHIN)  IV Stopped (08/09/19 1822)     LOS: 5 days   Time spent: 35 minutes.  Lorella Nimrod, MD [facility]  If 7PM-7AM, please contact night-coverage [email or web address]  08/10/2019, 3:07 PM   This record has been created using Systems analyst. Errors have been sought and corrected,but may not always be located. Such creation errors do not reflect on the standard of care.

## 2019-08-11 ENCOUNTER — Inpatient Hospital Stay: Payer: BC Managed Care – PPO

## 2019-08-11 LAB — GLUCOSE, CAPILLARY
Glucose-Capillary: 157 mg/dL — ABNORMAL HIGH (ref 70–99)
Glucose-Capillary: 125 mg/dL — ABNORMAL HIGH (ref 70–99)

## 2019-08-11 LAB — BASIC METABOLIC PANEL
Anion gap: 9 (ref 5–15)
BUN: 16 mg/dL (ref 6–20)
CO2: 26 mmol/L (ref 22–32)
Calcium: 8 mg/dL — ABNORMAL LOW (ref 8.9–10.3)
Chloride: 102 mmol/L (ref 98–111)
Creatinine, Ser: 0.91 mg/dL (ref 0.61–1.24)
GFR calc Af Amer: >60 mL/min (ref >60)
GFR calc non Af Amer: >60 mL/min (ref >60)
Glucose, Bld: 232 mg/dL — ABNORMAL HIGH (ref 70–99)
Potassium: 3.8 mmol/L (ref 3.5–5.1)
Sodium: 137 mmol/L (ref 135–145)

## 2019-08-11 LAB — CBC
HCT: 38.3 % — ABNORMAL LOW (ref 39.0–52.0)
Hemoglobin: 12.5 g/dL — ABNORMAL LOW (ref 13.0–17.0)
MCH: 29.8 pg (ref 26.0–34.0)
MCHC: 32.6 g/dL (ref 30.0–36.0)
MCV: 91.2 fL (ref 80.0–100.0)
Platelets: 415 10*3/uL — ABNORMAL HIGH (ref 150–400)
RBC: 4.2 MIL/uL — ABNORMAL LOW (ref 4.22–5.81)
RDW: 12.7 % (ref 11.5–15.5)
WBC: 7.5 10*3/uL (ref 4.0–10.5)
nRBC: 0 % (ref 0.0–0.2)

## 2019-08-11 LAB — C-REACTIVE PROTEIN: CRP: 0.5 mg/dL (ref <1.0)

## 2019-08-11 LAB — HEMOGLOBIN A1C
Hgb A1c MFr Bld: 5.9 % — ABNORMAL HIGH (ref 4.8–5.6)
Mean Plasma Glucose: 123 mg/dL

## 2019-08-11 LAB — PROCALCITONIN: Procalcitonin: <0.1 ng/mL

## 2019-08-11 LAB — FERRITIN: Ferritin: 115 ng/mL (ref 24–336)

## 2019-08-11 MED ORDER — IVERMECTIN 3 MG PO TABS
150.0000 ug/kg | ORAL_TABLET | Freq: Every day | ORAL | Status: DC
  Filled 2019-08-11: qty 7

## 2019-08-11 MED ORDER — SALINE SPRAY 0.65 % NA SOLN
2.0000 | NASAL | Status: DC | PRN
  Administered 2019-08-11: 2 via NASAL
  Filled 2019-08-11 (×2): qty 44

## 2019-08-11 MED ORDER — INSULIN ASPART 100 UNIT/ML ~~LOC~~ SOLN
0.0000 [IU] | Freq: Three times a day (TID) | SUBCUTANEOUS | Status: DC
  Administered 2019-08-11: 18:00:00 2 [IU] via SUBCUTANEOUS
  Filled 2019-08-11: qty 1

## 2019-08-11 MED ORDER — METHYLPREDNISOLONE SODIUM SUCC 40 MG IJ SOLR
40.0000 mg | INTRAMUSCULAR | Status: DC
  Administered 2019-08-12: 12:00:00 40 mg via INTRAVENOUS
  Filled 2019-08-11: qty 1

## 2019-08-11 MED ORDER — BENZONATATE 100 MG PO CAPS
200.0000 mg | ORAL_CAPSULE | Freq: Three times a day (TID) | ORAL | Status: DC | PRN
  Administered 2019-08-11: 200 mg via ORAL
  Filled 2019-08-11: qty 2

## 2019-08-11 MED ORDER — FUROSEMIDE 10 MG/ML IJ SOLN
40.0000 mg | Freq: Every day | INTRAMUSCULAR | Status: DC
  Administered 2019-08-11 – 2019-08-12 (×2): 40 mg via INTRAVENOUS
  Filled 2019-08-11 (×2): qty 4

## 2019-08-11 MED ORDER — IVERMECTIN 3 MG PO TABS
150.0000 ug/kg | ORAL_TABLET | Freq: Every day | ORAL | Status: AC
  Administered 2019-08-11: 19500 ug via ORAL
  Filled 2019-08-11: qty 7

## 2019-08-11 NOTE — Progress Notes (Addendum)
PROGRESS NOTE    Bruce Wang  FAO:[id number] DOB: 1965-03-16 DOA: 08/05/2019 PCP: [name], MD   Brief Narrative:  [name] a53 y.o.malewith a known history of recent diagnosis of Covid-191 week ago.  Received antibody infusion.  Came to ED with worsening cough, shortness of breath and pleuritic chest pain. On arrival he was hypoxic requiring 2 L which increased up to 15 L via HFNC. Chest x-ray with COVID-19 pneumonia. Completed remdesivir on 08/09/2019.  Continue to require higher levels of oxygen.  No Actemra given.  Unvaccinated.  Subjective: Patient continued to become short of breath with ambulation, feeling more comfortable at rest.  He did get some sleep last night.  Still anxious to go home.  Discussed about seen by a pulmonologist and he agreed to stay for that.  Assessment & Plan:   Active Problems:   Generalized weakness   COVID-19   Shortness of breath  Acute hypoxic respiratory failure secondary to COVID-19 pneumonia. Patient completed the course of remdesivir on 08/09/2019. Decadron was switched with Solu-Medrol due to worsening hypoxia. Saturating in mid 90s on 7L this morning, I decreased oxygen to 6 L with no change in saturation. Procalcitonin was elevated at 0.49 on 08/05/2019. - procalcitonin-within normal limit today. - ceftriaxone and azithromycin was started due to elevated initial procalcitonin for superadded bacterial infection as patient continued to require high oxygen. -Completed the course of azithromycin today. -Continue ceftriaxone for another 2 days. -Continue Solu-Medrol-decreased today from twice daily to once daily -Continue supportive care. -Try to wean him off from oxygen as tolerated-patient did show some improvement in oxygen requirement. -Patient might need oxygen on discharge. -Continue to monitor inflammatory markers-continue to improve. -Pulmonary consult-for persistent hypoxia. -Try proning and see if that will  help.  History of gout.  No acute concern. -Continue home dose of allopurinol.  History of allergic rhinitis. -Continue home nasal sprays and Zyrtec.  Spontaneous lower extremity bruising.  Resolved.  Platelets remain within normal limit.  Lower extremity Doppler was negative for DVT.  Morbid obesity. Body mass index is 37.97 kg/m.  Objective: Vitals:   08/11/19 0254 08/11/19 0258 08/11/19 0437 08/11/19 0904  BP:   (!) 132/74 108/73  Pulse:   (!) 106 82  Resp:   16 20  Temp:   (!) 97.5 F (36.4 C) 98.7 F (37.1 C)  TempSrc:   Oral Oral  SpO2: (!) 87% 92% 92% 97%  Weight:      Height:        Intake/Output Summary (Last 24 hours) at 08/11/2019 1515 Last data filed at 08/10/2019 1814 Gross per 24 hour  Intake 323.98 ml  Output --  Net 323.98 ml   Filed Weights   08/05/19 1252  Weight: (!) 127 kg    Examination:  General.  Well-developed, obese gentleman, in no acute distress. Pulmonary.  Few scattered dry crackles, normal respiratory effort. CV.  Regular rate and rhythm, no JVD, rub or murmur. Abdomen.  Soft, nontender, nondistended, BS positive. CNS.  Alert and oriented x3.  No focal neurologic deficit. Extremities.  No edema, no cyanosis, pulses intact and symmetrical. Psychiatry.  Judgment and insight appears normal.  DVT prophylaxis: Lovenox Code Status: Full Family Communication: Discussed with patient. Disposition Plan:  Status is: Inpatient  Remains inpatient appropriate because:Inpatient level of care appropriate due to severity of illness   Dispo: The patient is from: Home              Anticipated d/c is to: Home  Anticipated d/c date is: 2 days              Patient currently is not medically stable to d/c.  Patient still requiring 5-6 L, although off from HFNC. can be discharged home on oxygen if needed, would like to wean him off little more.  Consultants:   Pulmonary  Procedures:  Antimicrobials:  Ceftriaxone  Data Reviewed: I  have personally reviewed following labs and imaging studies  CBC: Recent Labs  Lab 08/07/19 0526 08/08/19 0449 08/09/19 0742 08/10/19 0622 08/11/19 0508  WBC 3.5* 5.9 5.9 7.5 7.5  HGB 13.5 13.5 13.1 13.0 12.5*  HCT 39.6 39.7 38.7* 39.8 38.3*  MCV 89.0 88.8 88.6 90.7 91.2  PLT 217 269 309 374 546*   Basic Metabolic Panel: Recent Labs  Lab 08/07/19 0526 08/08/19 0449 08/09/19 0742 08/10/19 0622 08/11/19 0508  NA 141 141 141 138 137  K 4.1 3.8 3.8 3.6 3.8  CL 103 103 106 105 102  CO2 27 28 24 24 26   GLUCOSE 156* 135* 166* 128* 232*  BUN 19 22* 21* 15 16  CREATININE 0.85 0.94 0.88 0.80 0.91  CALCIUM 8.7* 8.6* 8.7* 8.3* 8.0*  MG 2.2 2.3 2.4 2.3  --   PHOS 3.2  --   --   --   --    GFR: Estimated Creatinine Clearance: 130.8 mL/min (by C-G formula based on SCr of 0.91 mg/dL). Liver Function Tests: No results for input(s): AST, ALT, ALKPHOS, BILITOT, PROT, ALBUMIN in the last 168 hours. No results for input(s): LIPASE, AMYLASE in the last 168 hours. No results for input(s): AMMONIA in the last 168 hours. Coagulation Profile: Recent Labs  Lab 08/05/19 2225  INR 0.9   Cardiac Enzymes: No results for input(s): CKTOTAL, CKMB, CKMBINDEX, TROPONINI in the last 168 hours. BNP (last 3 results) No results for input(s): PROBNP in the last 8760 hours. HbA1C: No results for input(s): HGBA1C in the last 72 hours. CBG: No results for input(s): GLUCAP in the last 168 hours. Lipid Profile: No results for input(s): CHOL, HDL, LDLCALC, TRIG, CHOLHDL, LDLDIRECT in the last 72 hours. Thyroid Function Tests: No results for input(s): TSH, T4TOTAL, FREET4, T3FREE, THYROIDAB in the last 72 hours. Anemia Panel: No results for input(s): VITAMINB12, FOLATE, FERRITIN, TIBC, IRON, RETICCTPCT in the last 72 hours. Sepsis Labs: Recent Labs  Lab 08/05/19 2225 08/09/19 0742 08/10/19 0622 08/11/19 0508  PROCALCITON 0.42 <0.10 <0.10 <0.10    No results found for this or any previous visit  (from the past 240 hour(s)).   Radiology Studies: No results found.  Scheduled Meds: . allopurinol  300 mg Oral Daily  . vitamin C  500 mg Oral Daily  . cholecalciferol  2,000 Units Oral Daily  . enoxaparin (LOVENOX) injection  40 mg Subcutaneous Q24H  . fluticasone  1 spray Each Nare Daily  . insulin aspart  0-9 Units Subcutaneous TID WC  . ipratropium  1 spray Each Nare QHS  . Ipratropium-Albuterol  1 puff Inhalation Q6H  . loratadine  10 mg Oral Daily  . [START ON 08/12/2019] methylPREDNISolone (SOLU-MEDROL) injection  40 mg Intravenous Q24H  . thiamine  100 mg Oral Daily  . vitamin B-12  500 mcg Oral Q M,W,F  . zinc sulfate  220 mg Oral Daily   Continuous Infusions: . azithromycin Stopped (08/10/19 1814)  . cefTRIAXone (ROCEPHIN)  IV Stopped (08/10/19 1556)     LOS: 6 days   Time spent: 35 minutes.  Lorella Nimrod, MD [facility]  If 7PM-7AM, please contact night-coverage [email or web address]  08/11/2019, 3:15 PM   This record has been created using Systems analyst. Errors have been sought and corrected,but may not always be located. Such creation errors do not reflect on the standard of care.

## 2019-08-11 NOTE — Consult Note (Signed)
Pulmonary Medicine          Date: 08/11/2019,   MRN# [id number] Bruce Wang [birth year]     AdmissionWeight: (!) 127 kg                 CurrentWeight: (!) 127 kg  Referring physician: Dr Reesa Chew    CHIEF COMPLAINT:   Increased O2 requirement with acute hypoxemic respiratory faiulure   HISTORY OF PRESENT ILLNESS   54 yo M with hx of COVID19 07/2019.  He had sick contact from immediate family. Prior to admission he had flu like illness with diarrea and in ED noted to be acutely hypoxemic. He did respond to supplemental O2. Since admission he has received full spectrum of therapy for COVID. He was noted to have worsening hypoxemia with increased O2 requirement. Pulmonary consultation placed for additional evaluation and management. During interview patient is laying down and is able to speak in full sentences on 4-6L/min supplemental O2.    PAST MEDICAL HISTORY   Past Medical History:  Diagnosis Date  . Anemia   . Complication of anesthesia 2005   WOKE UP DURING INGUINAL HERNIA SURGERY  . 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 Gout   . History of chicken pox   . Pneumonia due to COVID-19 virus 07/2019   hospitalization     SURGICAL HISTORY   Past Surgical History:  Procedure Laterality Date  . COLONOSCOPY WITH PROPOFOL N/A 07/20/2016   1 TA, rpt 5 yrs Allen Norris, Darren, MD)  . HEMORRHOID SURGERY N/A 07/02/2018   Procedure: HEMORRHOIDECTOMY;  Surgeon: Jules Husbands, MD;  Location: [facility];  Service: General;  Laterality: N/A;  . INGUINAL HERNIA REPAIR  1990s   X2  . POLYPECTOMY  07/20/2016   Procedure: POLYPECTOMY;  Surgeon: Lucilla Lame, MD;  Location: [facility];  Service: Endoscopy;;  . ROUX-EN-Y PROCEDURE  2000     FAMILY HISTORY   Family History  Problem Relation Age of Onset  . Hypertension Mother   . Hypertension Sister   . Cancer Paternal Uncle 40       colon cancer, prostate cancer  . Dementia Maternal Grandmother   .  Parkinsonism Maternal Grandfather   . Parkinson's disease Maternal Grandfather   . Macular degeneration Father   . Macular degeneration Paternal Grandmother   . Coronary artery disease Neg Hx   . Stroke Neg Hx   . Diabetes Neg Hx      SOCIAL HISTORY   Social History   Tobacco Use  . Smoking status: Former Smoker    Packs/day: 1.00    Years: 8.00    Pack years: 8.00    Types: Cigarettes    Quit date: 01/09/2010    Years since quitting: 9.5  . Smokeless tobacco: Never Used  Vaping Use  . Vaping Use: Never used  Substance Use Topics  . Alcohol use: Yes    Alcohol/week: 21.0 standard drinks    Types: 21 Cans of beer per week    Comment: BEER OCC  . Drug use: No     MEDICATIONS    Home Medication:    Current Medication:  Current Facility-Administered Medications:  .  acetaminophen (TYLENOL) tablet 650 mg, 650 mg, Oral, Q6H PRN, Wieting, Richard, MD .  allopurinol (ZYLOPRIM) tablet 300 mg, 300 mg, Oral, Daily, Leslye Peer, Richard, MD, 300 mg at 08/11/19 1015 .  ascorbic acid (VITAMIN C) tablet 500 mg, 500 mg, Oral, Daily, Wieting,  Richard, MD, 500 mg at 08/11/19 1014 .  azithromycin (ZITHROMAX) 500 mg in sodium chloride 0.9 % 250 mL IVPB, 500 mg, Intravenous, Q24H, Lorella Nimrod, MD, Stopped at 08/10/19 1814 .  cefTRIAXone (ROCEPHIN) 2 g in sodium chloride 0.9 % 100 mL IVPB, 2 g, Intravenous, Q24H, Lorella Nimrod, MD, Stopped at 08/10/19 1556 .  cholecalciferol (VITAMIN D3) tablet 2,000 Units, 2,000 Units, Oral, Daily, Loletha Grayer, MD, 2,000 Units at 08/11/19 1014 .  enoxaparin (LOVENOX) injection 40 mg, 40 mg, Subcutaneous, Q24H, Wieting, Richard, MD, 40 mg at 08/10/19 2046 .  fluticasone (FLONASE) 50 MCG/ACT nasal spray 1 spray, 1 spray, Each Nare, Daily, Wieting, Richard, MD, 1 spray at 08/11/19 1016 .  insulin aspart (novoLOG) injection 0-9 Units, 0-9 Units, Subcutaneous, TID WC, Amin, Sumayya, MD .  ipratropium (ATROVENT) 0.03 % nasal spray 1 spray, 1 spray, Each  Nare, QHS, Oswald Hillock, RPH, 1 spray at 08/07/19 2202 .  Ipratropium-Albuterol (COMBIVENT) respimat 1 puff, 1 puff, Inhalation, Q6H, Wieting, Richard, MD, 1 puff at 08/11/19 1017 .  loratadine (CLARITIN) tablet 10 mg, 10 mg, Oral, Daily, Leslye Peer, Richard, MD, 10 mg at 08/11/19 1015 .  [START ON 08/12/2019] methylPREDNISolone sodium succinate (SOLU-MEDROL) 40 mg/mL injection 40 mg, 40 mg, Intravenous, Q24H, Amin, Sumayya, MD .  ondansetron (ZOFRAN) tablet 4 mg, 4 mg, Oral, Q6H PRN **OR** ondansetron (ZOFRAN) injection 4 mg, 4 mg, Intravenous, Q6H PRN, Wieting, Richard, MD .  sodium chloride (OCEAN) 0.65 % nasal spray 2 spray, 2 spray, Each Nare, PRN, Lorella Nimrod, MD .  thiamine tablet 100 mg, 100 mg, Oral, Daily, Wieting, Richard, MD, 100 mg at 08/11/19 1014 .  vitamin B-12 (CYANOCOBALAMIN) tablet 500 mcg, 500 mcg, Oral, Q M,W,F, Loletha Grayer, MD, 500 mcg at 08/08/19 0935 .  zinc sulfate capsule 220 mg, 220 mg, Oral, Daily, Wieting, Richard, MD, 220 mg at 08/11/19 1014    ALLERGIES   Patient has no known allergies.     REVIEW OF SYSTEMS    Review of Systems:  Gen:  Denies  fever, sweats, chills weigh loss  HEENT: Denies blurred vision, double vision, ear pain, eye pain, hearing loss, nose bleeds, sore throat Cardiac:  No dizziness, chest pain or heaviness, chest tightness,edema Resp:   Denies cough or sputum porduction, shortness of breath,wheezing, hemoptysis,  Gi: Denies swallowing difficulty, stomach pain, nausea or vomiting, diarrhea, constipation, bowel incontinence Gu:  Denies bladder incontinence, burning urine Ext:   Denies Joint pain, stiffness or swelling Skin: Denies  skin rash, easy bruising or bleeding or hives Endoc:  Denies polyuria, polydipsia , polyphagia or weight change Psych:   Denies depression, insomnia or hallucinations   Other:  All other systems negative   VS: BP 108/73 (BP Location: Right Arm)   Pulse 82   Temp 98.7 F (37.1 C) (Oral)   Resp  20   Ht 6' (1.829 m)   Wt (!) 127 kg   SpO2 97%   BMI 37.97 kg/m      PHYSICAL EXAM    GENERAL:NAD, no fevers, chills, no weakness no fatigue HEAD: Normocephalic, atraumatic.  EYES: Pupils equal, round, reactive to light. Extraocular muscles intact. No scleral icterus.  MOUTH: Moist mucosal membrane. Dentition intact. No abscess noted.  EAR, NOSE, THROAT: Clear without exudates. No external lesions.  NECK: Supple. No thyromegaly. No nodules. No JVD.  PULMONARY: Bilateral rhonchorous breath sounds.  CARDIOVASCULAR: S1 and S2. Regular rate and rhythm. No murmurs, rubs, or gallops. No edema. Pedal pulses 2+ bilaterally.  GASTROINTESTINAL: Soft, nontender, nondistended. No masses. Positive bowel sounds. No hepatosplenomegaly.  MUSCULOSKELETAL: No swelling, clubbing, or edema. Range of motion full in all extremities.  NEUROLOGIC: Cranial nerves II through XII are intact. No gross focal neurological deficits. Sensation intact. Reflexes intact.  SKIN: No ulceration, lesions, rashes, or cyanosis. Skin warm and dry. Turgor intact.  PSYCHIATRIC: Mood, affect within normal limits. The patient is awake, alert and oriented x 3. Insight, judgment intact.       IMAGING    DG Chest 2 View  Result Date: 08/05/2019 CLINICAL DATA:  Shortness of breath. Additional provided: Positive testing for COVID last week, nonproductive cough, shortness of breath. EXAM: CHEST - 2 VIEW COMPARISON:  No pertinent prior exams are available for comparison. FINDINGS: Heart size within normal limits. Extensive interstitial and ill-defined opacity throughout both lungs likely reflecting sequela of atypical/viral pneumonia given the provided history. No evidence of pleural effusion or pneumothorax. No acute bony abnormality identified. IMPRESSION: Extensive interstitial and ill-defined opacities throughout both lungs likely reflecting sequela of atypical/viral pneumonia given the provided history. Electronically Signed    By: Kellie Simmering DO   On: 08/05/2019 13:45   US Venous Img Lower Bilateral  Result Date: 08/05/2019 CLINICAL DATA:  Initial evaluation for acute pain, tenderness, with discoloration. EXAM: BILATERAL LOWER EXTREMITY VENOUS DOPPLER ULTRASOUND TECHNIQUE: Gray-scale sonography with graded compression, as well as color Doppler and duplex ultrasound were performed to evaluate the lower extremity deep venous systems from the level of the common femoral vein and including the common femoral, femoral, profunda femoral, popliteal and calf veins including the posterior tibial, peroneal and gastrocnemius veins when visible. The superficial great saphenous vein was also interrogated. Spectral Doppler was utilized to evaluate flow at rest and with distal augmentation maneuvers in the common femoral, femoral and popliteal veins. COMPARISON:  None. FINDINGS: RIGHT LOWER EXTREMITY Common Femoral Vein: No evidence of thrombus. Normal compressibility, respiratory phasicity and response to augmentation. Saphenofemoral Junction: No evidence of thrombus. Normal compressibility and flow on color Doppler imaging. Profunda Femoral Vein: No evidence of thrombus. Normal compressibility and flow on color Doppler imaging. Femoral Vein: No evidence of thrombus. Normal compressibility, respiratory phasicity and response to augmentation. Popliteal Vein: No evidence of thrombus. Normal compressibility, respiratory phasicity and response to augmentation. Calf Veins: No evidence of thrombus. Normal compressibility and flow on color Doppler imaging. Please note that the right peroneal vein is not visualized. Superficial Great Saphenous Vein: No evidence of thrombus. Normal compressibility. Venous Reflux:  None. Other Findings:  None. LEFT LOWER EXTREMITY Common Femoral Vein: No evidence of thrombus. Normal compressibility, respiratory phasicity and response to augmentation. Saphenofemoral Junction: No evidence of thrombus. Normal compressibility  and flow on color Doppler imaging. Profunda Femoral Vein: No evidence of thrombus. Normal compressibility and flow on color Doppler imaging. Femoral Vein: No evidence of thrombus. Normal compressibility, respiratory phasicity and response to augmentation. Popliteal Vein: No evidence of thrombus. Normal compressibility, respiratory phasicity and response to augmentation. Calf Veins: No evidence of thrombus. Normal compressibility and flow on color Doppler imaging. Superficial Great Saphenous Vein: No evidence of thrombus. Normal compressibility. Venous Reflux:  None. Other Findings:  None. IMPRESSION: No evidence of deep venous thrombosis in either lower extremity. Electronically Signed   By: Jeannine Boga M.D.   On: 08/05/2019 19:51      ASSESSMENT/PLAN   Acute hypoxemic respiratory failure   -due to COVID19 pneumonia  -  patient s/p Vecluri x 5 D , vit Z/C and IV steroids.    -  will add Ivermectin 19.5Kmcg daily po x 1d   -  Lasix 40mg  po daily   - CXR - repeat today - interval worsening infiltrates  - PT/OT  - brochopulmonary hygiene - IS daily several times each hour  -continue solumedrol 40 Iv daily   - ferritin and CRP trend - may consider actemra     Immunizations   - recommendation for post COVID19 vaccination - can wait 30days     Thank you for allowing me to participate in the care of this patient.    Patient/Family are satisfied with care plan and all questions have been answered.  This document was prepared using   Dragon voice recognition software and may include unintentional dictation errors.     Ottie Glazier, M.D.  Division of [place]

## 2019-08-12 DIAGNOSIS — R9389 Abnormal findings on diagnostic imaging of other specified body structures: Secondary | ICD-10-CM

## 2019-08-12 DIAGNOSIS — E860 Dehydration: Secondary | ICD-10-CM

## 2019-08-12 LAB — BASIC METABOLIC PANEL
Anion gap: 8 (ref 5–15)
BUN: 17 mg/dL (ref 6–20)
CO2: 32 mmol/L (ref 22–32)
Calcium: 8.6 mg/dL — ABNORMAL LOW (ref 8.9–10.3)
Chloride: 100 mmol/L (ref 98–111)
Creatinine, Ser: 1 mg/dL (ref 0.61–1.24)
GFR calc Af Amer: >60 mL/min (ref >60)
GFR calc non Af Amer: >60 mL/min (ref >60)
Glucose, Bld: 95 mg/dL (ref 70–99)
Potassium: 4.1 mmol/L (ref 3.5–5.1)
Sodium: 140 mmol/L (ref 135–145)

## 2019-08-12 LAB — CBC
HCT: 40.9 % (ref 39.0–52.0)
Hemoglobin: 13.3 g/dL (ref 13.0–17.0)
MCH: 29.8 pg (ref 26.0–34.0)
MCHC: 32.5 g/dL (ref 30.0–36.0)
MCV: 91.5 fL (ref 80.0–100.0)
Platelets: 447 10*3/uL — ABNORMAL HIGH (ref 150–400)
RBC: 4.47 MIL/uL (ref 4.22–5.81)
RDW: 13 % (ref 11.5–15.5)
WBC: 7.2 10*3/uL (ref 4.0–10.5)
nRBC: 0 % (ref 0.0–0.2)

## 2019-08-12 LAB — GLUCOSE, CAPILLARY
Glucose-Capillary: 81 mg/dL (ref 70–99)
Glucose-Capillary: 81 mg/dL (ref 70–99)
Glucose-Capillary: 105 mg/dL — ABNORMAL HIGH (ref 70–99)

## 2019-08-12 MED ORDER — SALINE SPRAY 0.65 % NA SOLN: 2.0000 | NASAL | 2 refills | Status: DC | PRN

## 2019-08-12 MED ORDER — GUAIFENESIN-CODEINE 100-10 MG/5ML PO SOLN: 10.0000 mL | ORAL | 0 refills | Status: DC | PRN

## 2019-08-12 MED ORDER — PREDNISONE 10 MG (21) PO TBPK
ORAL_TABLET | ORAL | 0 refills | Status: DC
Start: 2019-08-12 — End: 2019-08-22

## 2019-08-12 MED ORDER — CEFDINIR 300 MG PO CAPS
300.0000 mg | ORAL_CAPSULE | Freq: Two times a day (BID) | ORAL | 0 refills | Status: AC
Start: 2019-08-12 — End: 2019-08-14

## 2019-08-12 MED ORDER — GUAIFENESIN-CODEINE 100-10 MG/5ML PO SOLN
10.0000 mL | ORAL | Status: DC | PRN
  Administered 2019-08-12: 02:00:00 10 mL via ORAL

## 2019-08-12 MED ORDER — ZINC SULFATE 220 (50 ZN) MG PO CAPS: 220.0000 mg | ORAL_CAPSULE | Freq: Every day | ORAL | 0 refills | Status: DC

## 2019-08-12 NOTE — Progress Notes (Addendum)
SATURATION QUALIFICATIONS: (This note is used to comply with regulatory documentation for home oxygen)  Patient Saturations on Room Air at Rest =88%  Patient Saturations on 2 Liters of oxygen while Ambulating = 88-92%  Patient Saturations on 4 Liters of oxygen while Ambulating = 96%  Please briefly explain why patient needs home oxygen:   Patient is ok to have 2L of oxygen while at rest, his sats are around 94%, but when he ambulates/exerts himself, he desats to 88%  He needs 4 Liters of oxygen to be comfortable around 94-96%

## 2019-08-12 NOTE — Discharge Summary (Signed)
Physician Discharge Summary  Bruce Wang DZH:[id number] DOB: 1965/02/10 DOA: 08/05/2019  PCP: [name], MD  Admit date: 08/05/2019 Discharge date: 08/12/2019  Admitted From: Home Disposition:  Home  Recommendations for Outpatient Follow-up:  1. Follow up with PCP in 1-2 weeks 2. Follow-up with pulmonology in 1 week. 3. Please obtain BMP/CBC in one week 4. Please follow up on the following pending results:None  Home Health: Yes Equipment/Devices: Home oxygen Discharge Condition: Stable CODE STATUS: Full Diet recommendation: Heart Healthy / Carb Modified   Brief/Interim Summary: [name] a53 y.o.malewith a known history of recent diagnosis of Covid-191 week ago.  Received antibody infusion.  Came to ED with worsening cough, shortness of breath and pleuritic chest pain.  He was found to be positive for COVID-19 pneumonia.  Unvaccinated.  Initially requiring 2 L which increased up to 15 L via HFNC.  He received treatment with remdesivir and Decadron initially, completed the course of remdesivir, Decadron was switched with Solu-Medrol due to increased oxygen requirement.  We were able to slowly wean him off up to 2 L at rest and 4 L with ambulation on discharge.  He was also treated for superadded bacterial infection with Ceftin and azithromycin due to elevated procalcitonin.  He completed a course of azithromycin and discharged home on 2 more days of cefdinir.  He was also given a tapering course of steroid for discharge. Pulmonology was consulted and he will follow-up with his pulmonologist as an outpatient.  Patient did developed some hyperglycemia.  A1c of 5.9.  He was advised to follow a low-carb diet while on steroid.  Patient is morbidly obese which can complicate his recovery from COVID-19 pneumonia.  We counseled for weight loss.  He will continue with rest of his home meds and follow-up with his primary care provider for further management.  Discharge Diagnoses:   Active Problems:   Generalized weakness   COVID-19   Shortness of breath   Abnormal CXR   Dehydration  Discharge Instructions  Discharge Instructions    Diet - low sodium heart healthy   Complete by: As directed    Discharge instructions   Complete by: As directed    It was pleasure taking care of you. You are being discharged with 2 more days of antibiotics, please take it as directed. You are also being given a tapering dose of steroid, take it as directed. You are also going home with home oxygen, you need to liter of oxygen while resting and 4 L when walking around. Oxygen can make your nose dry, you can use saline nasal spray as often as needed. Please follow-up with pulmonologist as directed.   Increase activity slowly   Complete by: As directed    MyChart COVID-19 home monitoring program   Complete by: Aug 12, 2019    Is the patient willing to use the [facility] for home monitoring?: Yes   Temperature monitoring   Complete by: Aug 12, 2019    After how many days would you like to receive a notification of this patient's flowsheet entries?: 1     Allergies as of 08/12/2019   No Known Allergies     Medication List    TAKE these medications   allopurinol 300 MG tablet Commonly known as: ZYLOPRIM Take 1 tablet (300 mg total) by mouth daily. NEEDS OFFICE VISIT   azelastine 0.1 % nasal spray Commonly known as: ASTELIN Place 2 sprays into both nostrils 2 (two) times daily. Use in each nostril  as directed   cefdinir 300 MG capsule Commonly known as: OMNICEF Take 1 capsule (300 mg total) by mouth 2 (two) times daily for 2 days.   cetirizine 10 MG tablet Commonly known as: ZYRTEC Take 10 mg by mouth at bedtime.   fluticasone 50 MCG/ACT nasal spray Commonly known as: FLONASE INSTILL 2 SPRAYS INTO BOTH NOSTRILS DAILY   guaiFENesin-codeine 100-10 MG/5ML syrup Take 10 mLs by mouth every 4 (four) hours as needed for cough.   ipratropium 0.03 % nasal  spray Commonly known as: ATROVENT SMARTSIG:1 Both Nares Every Night   predniSONE 10 MG (21) Tbpk tablet Commonly known as: STERAPRED UNI-PAK 21 TAB Take 40 mg or 4 tablets daily for next 3 days, decrease to 3 tablets or 30 mg for next 2 days, then decrease to 2 tablets or 20 mg for another 2 days, then take 1 tablet or 10 mg for another day before stopping it.   sodium chloride 0.65 % Soln nasal spray Commonly known as: OCEAN Place 2 sprays into both nostrils as needed for congestion.   tadalafil 20 MG tablet Commonly known as: CIALIS TAKE 1 TABLET BY MOUTH EVERY OTHER DAY AS NEEDED FOR ERECTILE DYSFUNCTION. *INSUR 6 TABS/25 DAYS*   V-R VITAMIN B-12 500 MCG tablet Generic drug: vitamin B-12 Take 1 tablet (500 mcg total) by mouth every Monday, Wednesday, and Friday.   vitamin C 500 MG tablet Commonly known as: ASCORBIC ACID Take 500 mg by mouth daily.   Vitamin D 50 MCG (2000 UT) Caps Take 2,000 Units by mouth daily.   zinc sulfate 220 (50 Zn) MG capsule Take 1 capsule (220 mg total) by mouth daily. Start taking on: August 13, 2019            Durable Medical Equipment  (From admission, onward)         Start     Ordered   08/12/19 1306  For home use only DME oxygen  Once       Comments: Needs 2 L at rest and 4 L with ambulation.  Question Answer Comment  Length of Need 6 Months   Mode or (Route) Nasal cannula   Liters per Minute 4   Frequency Continuous (stationary and portable oxygen unit needed)   Oxygen conserving device Yes   Oxygen delivery system Gas      08/12/19 1306          Follow-up Information    [name], MD. Schedule an appointment as soon as possible for a visit.   Specialty: Family Medicine Contact information: [place] [address] [phone]        Ottie Glazier, MD. Schedule an appointment as soon as possible for a visit.   Specialty: Pulmonary Disease Why: To be seen within 1 week Contact  information: [place] [address] [phone]              No Known Allergies  Consultations:  Pulmonary  Procedures/Studies: DG Chest 2 View  Result Date: 08/05/2019 CLINICAL DATA:  Shortness of breath. Additional provided: Positive testing for COVID last week, nonproductive cough, shortness of breath. EXAM: CHEST - 2 VIEW COMPARISON:  No pertinent prior exams are available for comparison. FINDINGS: Heart size within normal limits. Extensive interstitial and ill-defined opacity throughout both lungs likely reflecting sequela of atypical/viral pneumonia given the provided history. No evidence of pleural effusion or pneumothorax. No acute bony abnormality identified. IMPRESSION: Extensive interstitial and ill-defined opacities throughout both  lungs likely reflecting sequela of atypical/viral pneumonia given the provided history. Electronically Signed   By: Kellie Simmering DO   On: 08/05/2019 13:45   US Venous Img Lower Bilateral  Result Date: 08/05/2019 CLINICAL DATA:  Initial evaluation for acute pain, tenderness, with discoloration. EXAM: BILATERAL LOWER EXTREMITY VENOUS DOPPLER ULTRASOUND TECHNIQUE: Gray-scale sonography with graded compression, as well as color Doppler and duplex ultrasound were performed to evaluate the lower extremity deep venous systems from the level of the common femoral vein and including the common femoral, femoral, profunda femoral, popliteal and calf veins including the posterior tibial, peroneal and gastrocnemius veins when visible. The superficial great saphenous vein was also interrogated. Spectral Doppler was utilized to evaluate flow at rest and with distal augmentation maneuvers in the common femoral, femoral and popliteal veins. COMPARISON:  None. FINDINGS: RIGHT LOWER EXTREMITY Common Femoral Vein: No evidence of thrombus. Normal compressibility, respiratory phasicity and response to augmentation. Saphenofemoral Junction: No evidence of  thrombus. Normal compressibility and flow on color Doppler imaging. Profunda Femoral Vein: No evidence of thrombus. Normal compressibility and flow on color Doppler imaging. Femoral Vein: No evidence of thrombus. Normal compressibility, respiratory phasicity and response to augmentation. Popliteal Vein: No evidence of thrombus. Normal compressibility, respiratory phasicity and response to augmentation. Calf Veins: No evidence of thrombus. Normal compressibility and flow on color Doppler imaging. Please note that the right peroneal vein is not visualized. Superficial Great Saphenous Vein: No evidence of thrombus. Normal compressibility. Venous Reflux:  None. Other Findings:  None. LEFT LOWER EXTREMITY Common Femoral Vein: No evidence of thrombus. Normal compressibility, respiratory phasicity and response to augmentation. Saphenofemoral Junction: No evidence of thrombus. Normal compressibility and flow on color Doppler imaging. Profunda Femoral Vein: No evidence of thrombus. Normal compressibility and flow on color Doppler imaging. Femoral Vein: No evidence of thrombus. Normal compressibility, respiratory phasicity and response to augmentation. Popliteal Vein: No evidence of thrombus. Normal compressibility, respiratory phasicity and response to augmentation. Calf Veins: No evidence of thrombus. Normal compressibility and flow on color Doppler imaging. Superficial Great Saphenous Vein: No evidence of thrombus. Normal compressibility. Venous Reflux:  None. Other Findings:  None. IMPRESSION: No evidence of deep venous thrombosis in either lower extremity. Electronically Signed   By: Jeannine Boga M.D.   On: 08/05/2019 19:51   DG Chest Port 1 View  Result Date: 08/11/2019 CLINICAL DATA:  Weakness and shortness of breath. Coronavirus infection. EXAM: PORTABLE CHEST 1 VIEW COMPARISON:  08/05/2019 FINDINGS: Heart size is normal. Slight worsening of widespread patchy pulmonary infiltrates. No dense consolidation or  lobar collapse. No effusion. Bony structures unremarkable. IMPRESSION: Slight worsening of widespread patchy pneumonia consistent with viral pneumonia. Electronically Signed   By: Nelson Chimes M.D.   On: 08/11/2019 15:35     Subjective: Patient has no new complaint today.  He wants to go home.  Discharge Exam: Vitals:   08/12/19 0736 08/12/19 1228  BP: 93/66 110/76  Pulse: 76 95  Resp: 17 19  Temp: 98.6 F (37 C) 97.6 F (36.4 C)  SpO2: 95% 96%   Vitals:   08/12/19 0111 08/12/19 0608 08/12/19 0736 08/12/19 1228  BP: (!) 128/100 92/78 93/66 110/76  Pulse: 85 88 76 95  Resp: 19  17 19   Temp: 98.9 F (37.2 C) 97.8 F (36.6 C) 98.6 F (37 C) 97.6 F (36.4 C)  TempSrc: Oral Oral Oral Oral  SpO2: 95% (!) 89% 95% 96%  Weight:      Height:  General: Pt is alert, awake, not in acute distress Cardiovascular: RRR, S1/S2 +, no rubs, no gallops Respiratory: CTA bilaterally, no wheezing, no rhonchi Abdominal: Soft, NT, ND, bowel sounds + Extremities: no edema, no cyanosis   The results of significant diagnostics from this hospitalization (including imaging, microbiology, ancillary and laboratory) are listed below for reference.    Microbiology: No results found for this or any previous visit (from the past 240 hour(s)).   Labs: BNP (last 3 results) No results for input(s): BNP in the last 8760 hours. Basic Metabolic Panel: Recent Labs  Lab 08/07/19 0526 08/07/19 0526 08/08/19 0449 08/09/19 0742 08/10/19 0622 08/11/19 0508 08/12/19 0547  NA 141   < > 141 141 138 137 140  K 4.1   < > 3.8 3.8 3.6 3.8 4.1  CL 103   < > 103 106 105 102 100  CO2 27   < > 28 24 24 26  32  GLUCOSE 156*   < > 135* 166* 128* 232* 95  BUN 19   < > 22* 21* 15 16 17   CREATININE 0.85   < > 0.94 0.88 0.80 0.91 1.00  CALCIUM 8.7*   < > 8.6* 8.7* 8.3* 8.0* 8.6*  MG 2.2  --  2.3 2.4 2.3  --   --   PHOS 3.2  --   --   --   --   --   --    < > = values in this interval not displayed.   Liver  Function Tests: No results for input(s): AST, ALT, ALKPHOS, BILITOT, PROT, ALBUMIN in the last 168 hours. No results for input(s): LIPASE, AMYLASE in the last 168 hours. No results for input(s): AMMONIA in the last 168 hours. CBC: Recent Labs  Lab 08/08/19 0449 08/09/19 0742 08/10/19 0622 08/11/19 0508 08/12/19 0547  WBC 5.9 5.9 7.5 7.5 7.2  HGB 13.5 13.1 13.0 12.5* 13.3  HCT 39.7 38.7* 39.8 38.3* 40.9  MCV 88.8 88.6 90.7 91.2 91.5  PLT 269 309 374 415* 447*   Cardiac Enzymes: No results for input(s): CKTOTAL, CKMB, CKMBINDEX, TROPONINI in the last 168 hours. BNP: Invalid input(s): POCBNP CBG: Recent Labs  Lab 08/11/19 1705 08/11/19 2112 08/12/19 0739 08/12/19 1229  GLUCAP 157* 125* 81  81 105*   D-Dimer No results for input(s): DDIMER in the last 72 hours. Hgb A1c Recent Labs    08/11/19 0508  HGBA1C 5.9*   Lipid Profile No results for input(s): CHOL, HDL, LDLCALC, TRIG, CHOLHDL, LDLDIRECT in the last 72 hours. Thyroid function studies No results for input(s): TSH, T4TOTAL, T3FREE, THYROIDAB in the last 72 hours.  Invalid input(s): FREET3 Anemia work up [employer]    08/11/19 1621  FERRITIN 115   Urinalysis No results found for: COLORURINE, APPEARANCEUR, LABSPEC, [place], GLUCOSEU, HGBUR, BILIRUBINUR, KETONESUR, PROTEINUR, UROBILINOGEN, NITRITE, LEUKOCYTESUR Sepsis Labs Invalid input(s): PROCALCITONIN,  WBC,  LACTICIDVEN Microbiology No results found for this or any previous visit (from the past 240 hour(s)).  Time coordinating discharge: Over 30 minutes  SIGNED:  Lorella Nimrod, MD  [facility] 08/12/2019, 1:22 PM  If 7PM-7AM, please contact night-coverage [email or web address]  This record has been created using Systems analyst. Errors have been sought and corrected,but may not always be located. Such creation errors do not reflect on the standard of care.

## 2019-08-12 NOTE — TOC Transition Note (Signed)
Transition of Care [facility]) - CM/SW Discharge Note   Patient Details  Name: Bruce Wang MRN: [id number] Date of Birth: August 22, 1965  [facility]) CM/SW Contact:  Elease Hashimoto, LCSW Phone Number: 08/12/2019, 12:50 PM   Clinical Narrative:   Pt needs home O2 and have been ordered via [employer] to go home with. Pt has no other needs and lives with his girlfriend-[name] who can assist if needed. Pt is progressing each day and feels so much better than he did. He is mobile and moves well. [employer] to deliver to room prior to DC home.      Barriers to Discharge: Barriers Resolved   Patient Goals and CMS Choice   CMS Medicare.gov Compare Post Acute Care list provided to:: Patient Choice offered to / list presented to : Patient  Discharge Placement                       Discharge Plan and Services In-house Referral: Clinical Social Work   Post Acute Care Choice: Durable Medical Equipment          DME Arranged: Oxygen DME Agency: [employer] Date DME Agency Contacted: 08/12/19 Time DME Agency Contacted: [id number] Representative spoke with at DME Agency: [place] Determinants of Health ([place]) Interventions     Readmission Risk Interventions No flowsheet data found.

## 2019-08-12 NOTE — Progress Notes (Signed)
Pulmonary Medicine          Date: 08/12/2019,   MRN# [id number] Bruce Wang 11/27/1965     AdmissionWeight: (!) 127 kg                 CurrentWeight: (!) 127 kg  Referring physician: Dr Reesa Chew    CHIEF COMPLAINT:   Increased O2 requirement with acute hypoxemic respiratory faiulure   SUBJECTIVE   Patient reports clinical improvement. He diuresed well overnight. He is on 2Lnc at rest and 4 with exertion.  He had exertional walk done today to document O2 needs.   Plan for d/c home today and outpatient workup/therapy in pulm clinic.     PAST MEDICAL HISTORY   Past Medical History:  Diagnosis Date  . Anemia   . Complication of anesthesia 2005   WOKE UP DURING INGUINAL HERNIA SURGERY  . 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 Gout   . History of chicken pox   . Pneumonia due to COVID-19 virus 07/2019   hospitalization     SURGICAL HISTORY   Past Surgical History:  Procedure Laterality Date  . COLONOSCOPY WITH PROPOFOL N/A 07/20/2016   1 TA, rpt 5 yrs Allen Norris, Darren, MD)  . HEMORRHOID SURGERY N/A 07/02/2018   Procedure: HEMORRHOIDECTOMY;  Surgeon: Jules Husbands, MD;  Location: [facility];  Service: General;  Laterality: N/A;  . INGUINAL HERNIA REPAIR  1990s   X2  . POLYPECTOMY  07/20/2016   Procedure: POLYPECTOMY;  Surgeon: Lucilla Lame, MD;  Location: [place];  Service: Endoscopy;;  . ROUX-EN-Y PROCEDURE  2000     FAMILY HISTORY   Family History  Problem Relation Age of Onset  . Hypertension Mother   . Hypertension Sister   . Cancer Paternal Uncle 62       colon cancer, prostate cancer  . Dementia Maternal Grandmother   . Parkinsonism Maternal Grandfather   . Parkinson's disease Maternal Grandfather   . Macular degeneration Father   . Macular degeneration Paternal Grandmother   . Coronary artery disease Neg Hx   . Stroke Neg Hx   . Diabetes Neg Hx      SOCIAL HISTORY   Social History   Tobacco Use  . Smoking  status: Former Smoker    Packs/day: 1.00    Years: 8.00    Pack years: 8.00    Types: Cigarettes    Quit date: 01/09/2010    Years since quitting: 9.5  . Smokeless tobacco: Never Used  Vaping Use  . Vaping Use: Never used  Substance Use Topics  . Alcohol use: Yes    Alcohol/week: 21.0 standard drinks    Types: 21 Cans of beer per week    Comment: BEER OCC  . Drug use: No     MEDICATIONS    Home Medication:    Current Medication:  Current Facility-Administered Medications:  .  acetaminophen (TYLENOL) tablet 650 mg, 650 mg, Oral, Q6H PRN, Wieting, Richard, MD .  allopurinol (ZYLOPRIM) tablet 300 mg, 300 mg, Oral, Daily, Leslye Peer, Richard, MD, 300 mg at 08/12/19 1201 .  ascorbic acid (VITAMIN C) tablet 500 mg, 500 mg, Oral, Daily, Wieting, Richard, MD, 500 mg at 08/12/19 1200 .  azithromycin (ZITHROMAX) 500 mg in sodium chloride 0.9 % 250 mL IVPB, 500 mg, Intravenous, Q24H, Amin, Sumayya, MD, Stopping Infusion hung by another clincian at 08/12/19 0000 .  cefTRIAXone (ROCEPHIN) 2 g in sodium chloride 0.9 %  100 mL IVPB, 2 g, Intravenous, Q24H, Lorella Nimrod, MD, Stopped at 08/12/19 4098 .  cholecalciferol (VITAMIN D3) tablet 2,000 Units, 2,000 Units, Oral, Daily, Loletha Grayer, MD, 2,000 Units at 08/11/19 1014 .  enoxaparin (LOVENOX) injection 40 mg, 40 mg, Subcutaneous, Q24H, Wieting, Richard, MD, 40 mg at 08/11/19 2129 .  fluticasone (FLONASE) 50 MCG/ACT nasal spray 1 spray, 1 spray, Each Nare, Daily, Loletha Grayer, MD, 1 spray at 08/12/19 1202 .  furosemide (LASIX) injection 40 mg, 40 mg, Intravenous, Daily, Lanney Gins, Ruger Saxer, MD, 40 mg at 08/12/19 1201 .  guaiFENesin-codeine 100-10 MG/5ML solution 10 mL, 10 mL, Oral, Q4H PRN, Sharion Settler, NP, 10 mL at 08/12/19 0209 .  insulin aspart (novoLOG) injection 0-9 Units, 0-9 Units, Subcutaneous, TID WC, Lorella Nimrod, MD, 2 Units at 08/11/19 1748 .  ipratropium (ATROVENT) 0.03 % nasal spray 1 spray, 1 spray, Each Nare, QHS, Oswald Hillock, RPH, 1 spray at 08/07/19 2202 .  Ipratropium-Albuterol (COMBIVENT) respimat 1 puff, 1 puff, Inhalation, Q6H, Wieting, Richard, MD, 1 puff at 08/12/19 1203 .  loratadine (CLARITIN) tablet 10 mg, 10 mg, Oral, Daily, Wieting, Richard, MD, 10 mg at 08/12/19 1200 .  methylPREDNISolone sodium succinate (SOLU-MEDROL) 40 mg/mL injection 40 mg, 40 mg, Intravenous, Q24H, Amin, Soundra Pilon, MD, 40 mg at 08/12/19 1203 .  ondansetron (ZOFRAN) tablet 4 mg, 4 mg, Oral, Q6H PRN **OR** ondansetron (ZOFRAN) injection 4 mg, 4 mg, Intravenous, Q6H PRN, Wieting, Richard, MD .  sodium chloride (OCEAN) 0.65 % nasal spray 2 spray, 2 spray, Each Nare, PRN, Lorella Nimrod, MD, 2 spray at 08/11/19 2128 .  thiamine tablet 100 mg, 100 mg, Oral, Daily, Wieting, Richard, MD, 100 mg at 08/12/19 1200 .  vitamin B-12 (CYANOCOBALAMIN) tablet 500 mcg, 500 mcg, Oral, Q M,W,F, Loletha Grayer, MD, 500 mcg at 08/11/19 1855 .  zinc sulfate capsule 220 mg, 220 mg, Oral, Daily, Wieting, Richard, MD, 220 mg at 08/12/19 1200    ALLERGIES   Patient has no known allergies.     REVIEW OF SYSTEMS    Review of Systems:  Gen:  Denies  fever, sweats, chills weigh loss  HEENT: Denies blurred vision, double vision, ear pain, eye pain, hearing loss, nose bleeds, sore throat Cardiac:  No dizziness, chest pain or heaviness, chest tightness,edema Resp:   Denies cough or sputum porduction, shortness of breath,wheezing, hemoptysis,  Gi: Denies swallowing difficulty, stomach pain, nausea or vomiting, diarrhea, constipation, bowel incontinence Gu:  Denies bladder incontinence, burning urine Ext:   Denies Joint pain, stiffness or swelling Skin: Denies  skin rash, easy bruising or bleeding or hives Endoc:  Denies polyuria, polydipsia , polyphagia or weight change Psych:   Denies depression, insomnia or hallucinations   Other:  All other systems negative   VS: BP 110/76 (BP Location: Right Arm)   Pulse 95   Temp 97.6 F (36.4 C)  (Oral)   Resp 19   Ht 6' (1.829 m)   Wt (!) 127 kg   SpO2 96%   BMI 37.97 kg/m      PHYSICAL EXAM    GENERAL:NAD, no fevers, chills, no weakness no fatigue HEAD: Normocephalic, atraumatic.  EYES: Pupils equal, round, reactive to light. Extraocular muscles intact. No scleral icterus.  MOUTH: Moist mucosal membrane. Dentition intact. No abscess noted.  EAR, NOSE, THROAT: Clear without exudates. No external lesions.  NECK: Supple. No thyromegaly. No nodules. No JVD.  PULMONARY: Bilateral rhonchorous breath sounds.  CARDIOVASCULAR: S1 and S2. Regular rate and rhythm. No murmurs, rubs,  or gallops. No edema. Pedal pulses 2+ bilaterally.  GASTROINTESTINAL: Soft, nontender, nondistended. No masses. Positive bowel sounds. No hepatosplenomegaly.  MUSCULOSKELETAL: No swelling, clubbing, or edema. Range of motion full in all extremities.  NEUROLOGIC: Cranial nerves II through XII are intact. No gross focal neurological deficits. Sensation intact. Reflexes intact.  SKIN: No ulceration, lesions, rashes, or cyanosis. Skin warm and dry. Turgor intact.  PSYCHIATRIC: Mood, affect within normal limits. The patient is awake, alert and oriented x 3. Insight, judgment intact.       IMAGING    DG Chest 2 View  Result Date: 08/05/2019 CLINICAL DATA:  Shortness of breath. Additional provided: Positive testing for COVID last week, nonproductive cough, shortness of breath. EXAM: CHEST - 2 VIEW COMPARISON:  No pertinent prior exams are available for comparison. FINDINGS: Heart size within normal limits. Extensive interstitial and ill-defined opacity throughout both lungs likely reflecting sequela of atypical/viral pneumonia given the provided history. No evidence of pleural effusion or pneumothorax. No acute bony abnormality identified. IMPRESSION: Extensive interstitial and ill-defined opacities throughout both lungs likely reflecting sequela of atypical/viral pneumonia given the provided history.  Electronically Signed   By: Kellie Simmering DO   On: 08/05/2019 13:45   US Venous Img Lower Bilateral  Result Date: 08/05/2019 CLINICAL DATA:  Initial evaluation for acute pain, tenderness, with discoloration. EXAM: BILATERAL LOWER EXTREMITY VENOUS DOPPLER ULTRASOUND TECHNIQUE: Gray-scale sonography with graded compression, as well as color Doppler and duplex ultrasound were performed to evaluate the lower extremity deep venous systems from the level of the common femoral vein and including the common femoral, femoral, profunda femoral, popliteal and calf veins including the posterior tibial, peroneal and gastrocnemius veins when visible. The superficial great saphenous vein was also interrogated. Spectral Doppler was utilized to evaluate flow at rest and with distal augmentation maneuvers in the common femoral, femoral and popliteal veins. COMPARISON:  None. FINDINGS: RIGHT LOWER EXTREMITY Common Femoral Vein: No evidence of thrombus. Normal compressibility, respiratory phasicity and response to augmentation. Saphenofemoral Junction: No evidence of thrombus. Normal compressibility and flow on color Doppler imaging. Profunda Femoral Vein: No evidence of thrombus. Normal compressibility and flow on color Doppler imaging. Femoral Vein: No evidence of thrombus. Normal compressibility, respiratory phasicity and response to augmentation. Popliteal Vein: No evidence of thrombus. Normal compressibility, respiratory phasicity and response to augmentation. Calf Veins: No evidence of thrombus. Normal compressibility and flow on color Doppler imaging. Please note that the right peroneal vein is not visualized. Superficial Great Saphenous Vein: No evidence of thrombus. Normal compressibility. Venous Reflux:  None. Other Findings:  None. LEFT LOWER EXTREMITY Common Femoral Vein: No evidence of thrombus. Normal compressibility, respiratory phasicity and response to augmentation. Saphenofemoral Junction: No evidence of thrombus.  Normal compressibility and flow on color Doppler imaging. Profunda Femoral Vein: No evidence of thrombus. Normal compressibility and flow on color Doppler imaging. Femoral Vein: No evidence of thrombus. Normal compressibility, respiratory phasicity and response to augmentation. Popliteal Vein: No evidence of thrombus. Normal compressibility, respiratory phasicity and response to augmentation. Calf Veins: No evidence of thrombus. Normal compressibility and flow on color Doppler imaging. Superficial Great Saphenous Vein: No evidence of thrombus. Normal compressibility. Venous Reflux:  None. Other Findings:  None. IMPRESSION: No evidence of deep venous thrombosis in either lower extremity. Electronically Signed   By: Jeannine Boga M.D.   On: 08/05/2019 19:51   DG Chest Port 1 View  Result Date: 08/11/2019 CLINICAL DATA:  Weakness and shortness of breath. Coronavirus infection. EXAM: PORTABLE CHEST 1 VIEW  COMPARISON:  08/05/2019 FINDINGS: Heart size is normal. Slight worsening of widespread patchy pulmonary infiltrates. No dense consolidation or lobar collapse. No effusion. Bony structures unremarkable. IMPRESSION: Slight worsening of widespread patchy pneumonia consistent with viral pneumonia. Electronically Signed   By: Nelson Chimes M.D.   On: 08/11/2019 15:35      ASSESSMENT/PLAN   Acute hypoxemic respiratory failure   -due to COVID19 pneumonia  -  patient s/p Vecluri x 5 D , vit Z/C and IV steroids.    - will add Ivermectin 19.5Kmcg daily po x 1d   -  Lasix 40mg  po x1  - CXR - repeat today - interval worsening infiltrates  - PT/OT  - brochopulmonary hygiene - IS daily several times each hour  -continue solumedrol 40 Iv daily   - ferritin and CRP trending down -    Immunizations   - recommendation for post COVID19 vaccination - can wait 30days     Thank you for allowing me to participate in the care of this patient.    Patient/Family are satisfied with care plan and all questions  have been answered.  This document was prepared using   Dragon voice recognition software and may include unintentional dictation errors.     Ottie Glazier, M.D.  Division of [place]

## 2019-08-13 DIAGNOSIS — U071 COVID-19: Secondary | ICD-10-CM | POA: Diagnosis not present

## 2019-08-13 LAB — FUNGITELL, SERUM: Fungitell Result: <31 pg/mL (ref <80)

## 2019-08-17 ENCOUNTER — Other Ambulatory Visit: Payer: Self-pay | Admitting: Family Medicine

## 2019-08-17 DIAGNOSIS — J302 Other seasonal allergic rhinitis: Secondary | ICD-10-CM

## 2019-08-20 DIAGNOSIS — J208 Acute bronchitis due to other specified organisms: Secondary | ICD-10-CM | POA: Diagnosis not present

## 2019-08-20 DIAGNOSIS — U071 COVID-19: Secondary | ICD-10-CM | POA: Diagnosis not present

## 2019-08-22 ENCOUNTER — Other Ambulatory Visit: Payer: Self-pay

## 2019-08-22 ENCOUNTER — Encounter: Payer: Self-pay | Admitting: Family Medicine

## 2019-08-22 ENCOUNTER — Ambulatory Visit (INDEPENDENT_AMBULATORY_CARE_PROVIDER_SITE_OTHER): Payer: BC Managed Care – PPO | Admitting: Family Medicine

## 2019-08-22 VITALS — BP 118/80 | HR 108 | Temp 98.0°F | Ht 72.0 in | Wt 280.1 lb

## 2019-08-22 DIAGNOSIS — N529 Male erectile dysfunction, unspecified: Secondary | ICD-10-CM | POA: Diagnosis not present

## 2019-08-22 DIAGNOSIS — U071 COVID-19: Secondary | ICD-10-CM | POA: Diagnosis not present

## 2019-08-22 DIAGNOSIS — J1282 Pneumonia due to coronavirus disease 2019: Secondary | ICD-10-CM | POA: Diagnosis not present

## 2019-08-22 LAB — CBC WITH DIFFERENTIAL/PLATELET
Basophils Absolute: 0.1 10*3/uL (ref 0.0–0.1)
Basophils Relative: 0.9 % (ref 0.0–3.0)
Eosinophils Absolute: 0.2 10*3/uL (ref 0.0–0.7)
Eosinophils Relative: 3.4 % (ref 0.0–5.0)
HCT: 41.7 % (ref 39.0–52.0)
Hemoglobin: 13.5 g/dL (ref 13.0–17.0)
Lymphocytes Relative: 20.9 % (ref 12.0–46.0)
Lymphs Abs: 1.3 10*3/uL (ref 0.7–4.0)
MCHC: 32.4 g/dL (ref 30.0–36.0)
MCV: 91.8 fl (ref 78.0–100.0)
Monocytes Absolute: 0.6 10*3/uL (ref 0.1–1.0)
Monocytes Relative: 8.7 % (ref 3.0–12.0)
Neutro Abs: 4.2 10*3/uL (ref 1.4–7.7)
Neutrophils Relative %: 66.1 % (ref 43.0–77.0)
Platelets: 285 10*3/uL (ref 150.0–400.0)
RBC: 4.54 Mil/uL (ref 4.22–5.81)
RDW: 14.1 % (ref 11.5–15.5)
WBC: 6.4 10*3/uL (ref 4.0–10.5)

## 2019-08-22 LAB — BASIC METABOLIC PANEL
BUN: 8 mg/dL (ref 6–23)
CO2: 30 mEq/L (ref 19–32)
Calcium: 9.2 mg/dL (ref 8.4–10.5)
Chloride: 103 mEq/L (ref 96–112)
Creatinine, Ser: 1.03 mg/dL (ref 0.40–1.50)
GFR: 75.54 mL/min (ref >60.00)
Glucose, Bld: 97 mg/dL (ref 70–99)
Potassium: 4.3 mEq/L (ref 3.5–5.1)
Sodium: 140 mEq/L (ref 135–145)

## 2019-08-22 MED ORDER — TADALAFIL 20 MG PO TABS: 20.0000 mg | ORAL_TABLET | Freq: Every day | ORAL | 11 refills | Status: DC | PRN

## 2019-08-22 NOTE — Progress Notes (Signed)
This visit was conducted in person.  BP 118/80 (BP Location: Left Arm, Patient Position: Sitting, Cuff Size: Large)   Pulse (!) 108   Temp 98 F (36.7 C) (Temporal)   Ht 6' (1.829 m)   Wt 280 lb 2 oz (127.1 kg)   SpO2 94%   BMI 37.99 kg/m    CC: hosp f/u visit  Subjective:    Patient ID: Bruce Wang, male    DOB: July 11, 1965, 54 y.o.   MRN: [id number]  HPI: Bruce Wang is a 54 y.o. male presenting on 08/22/2019 for Hospitalization Follow-up   Recent diagnosis of COVID pneumonia initially treated with mAb infusion treatment, ended up needing hospitalization with further oxygen support up to 15L via HFNC, as well as treatment with remdesivir and decadron. Discharged on 2L at rest, 4L with exertion. Also discharged on cefdinir and azithromycin for superimposed bacterial infection. Hyperglycemia noted while on steroids - rec low carb diet. He did finish prednisone taper.   Established with [facility] pulmonology Dr Lanney Gins seen on Wed, note reviewed - started on colchicine 30d course, rec return to work 8/25, with planned CXR and PFTs over the next few months. Oxygen d/c'd this week.   First day of symptoms - 07/29/2019.  All 4 family members ended up being hospitalized and needed hospitalization - even 72 and 25yo daughters.   Requests cialis refill. This is effective.  COVID vaccine - discussed.    Admit date: 08/05/2019 Discharge date: 08/12/2019 TCM hosp f/u phone call not completed.   Admitted From: Home Disposition:  Home  Recommendations for Outpatient Follow-up:  1. Follow up with PCP in 1-2 weeks 2. Follow-up with pulmonology in 1 week. 3. Please obtain BMP/CBC in one week 4. Please follow up on the following pending results:None  Home Health: Yes Equipment/Devices: Home oxygen Discharge Condition: Stable CODE STATUS: Full Diet recommendation: Heart Healthy / Carb Modified   Discharge Diagnoses:  Active Problems:   Generalized weakness   COVID-19    Shortness of breath   Abnormal CXR   Dehydration     Relevant past medical, surgical, family and social history reviewed and updated as indicated. Interim medical history since our last visit reviewed. Allergies and medications reviewed and updated. Outpatient Medications Prior to Visit  Medication Sig Dispense Refill  . allopurinol (ZYLOPRIM) 300 MG tablet Take 1 tablet (300 mg total) by mouth daily. 90 tablet 0  . azelastine (ASTELIN) 0.1 % nasal spray Place 2 sprays into both nostrils 2 (two) times daily. Use in each nostril as directed 30 mL 5  . cetirizine (ZYRTEC) 10 MG tablet Take 10 mg by mouth at bedtime.     . Cholecalciferol (VITAMIN D) 2000 units CAPS Take 2,000 Units by mouth daily.     . colchicine 0.6 MG tablet Take 0.6 mg by mouth daily.    . fluticasone (FLONASE) 50 MCG/ACT nasal spray INSTILL 2 SPRAYS INTO BOTH NOSTRILS DAILY 48 mL 1  . ipratropium (ATROVENT) 0.03 % nasal spray SMARTSIG:1 Both Nares Every Night    . vitamin B-12 (V-R VITAMIN B-12) 500 MCG tablet Take 1 tablet (500 mcg total) by mouth every Monday, Wednesday, and Friday.    . vitamin C (ASCORBIC ACID) 500 MG tablet Take 500 mg by mouth daily.    Marland Kitchen zinc sulfate 220 (50 Zn) MG capsule Take 1 capsule (220 mg total) by mouth daily. 90 capsule 0  . tadalafil (CIALIS) 20 MG tablet TAKE 1 TABLET BY MOUTH EVERY OTHER  DAY AS NEEDED FOR ERECTILE DYSFUNCTION. *INSUR 6 TABS/25 DAYS* 6 tablet 2  . guaiFENesin-codeine 100-10 MG/5ML syrup Take 10 mLs by mouth every 4 (four) hours as needed for cough. 120 mL 0  . predniSONE (STERAPRED UNI-PAK 21 TAB) 10 MG (21) TBPK tablet Take 40 mg or 4 tablets daily for next 3 days, decrease to 3 tablets or 30 mg for next 2 days, then decrease to 2 tablets or 20 mg for another 2 days, then take 1 tablet or 10 mg for another day before stopping it. 21 tablet 0  . sodium chloride (OCEAN) 0.65 % SOLN nasal spray Place 2 sprays into both nostrils as needed for congestion. 88 mL 2   No  facility-administered medications prior to visit.     Per HPI unless specifically indicated in ROS section below Review of Systems Objective:  BP 118/80 (BP Location: Left Arm, Patient Position: Sitting, Cuff Size: Large)   Pulse (!) 108   Temp 98 F (36.7 C) (Temporal)   Ht 6' (1.829 m)   Wt 280 lb 2 oz (127.1 kg)   SpO2 94%   BMI 37.99 kg/m   Wt Readings from Last 3 Encounters:  08/22/19 280 lb 2 oz (127.1 kg)  08/05/19 (!) 280 lb (127 kg)  05/14/19 260 lb (117.9 kg)      Physical Exam Vitals and nursing note reviewed.  Constitutional:      Appearance: Normal appearance. He is obese. He is not ill-appearing.  HENT:     Head: Normocephalic and atraumatic.  Eyes:     Extraocular Movements: Extraocular movements intact.     Pupils: Pupils are equal, round, and reactive to light.  Cardiovascular:     Rate and Rhythm: Normal rate and regular rhythm.     Pulses: Normal pulses.     Heart sounds: Normal heart sounds. No murmur heard.   Pulmonary:     Effort: Pulmonary effort is normal. No respiratory distress.     Breath sounds: Normal breath sounds. No wheezing, rhonchi or rales.  Musculoskeletal:     Right lower leg: No edema.     Left lower leg: No edema.  Skin:    General: Skin is warm and dry.     Findings: No rash.  Neurological:     Mental Status: He is alert.  Psychiatric:        Mood and Affect: Mood normal.        Behavior: Behavior normal.       Lab Results  Component Value Date   CREATININE 1.00 08/12/2019   BUN 17 08/12/2019   NA 140 08/12/2019   K 4.1 08/12/2019   CL 100 08/12/2019   CO2 32 08/12/2019    Lab Results  Component Value Date   WBC 7.2 08/12/2019   HGB 13.3 08/12/2019   HCT 40.9 08/12/2019   MCV 91.5 08/12/2019   PLT 447 (H) 08/12/2019    Assessment & Plan:  This visit occurred during the SARS-CoV-2 public health emergency.  Safety protocols were in place, including screening questions prior to the visit, additional usage of  staff PPE, and extensive cleaning of exam room while observing appropriate contact time as indicated for disinfecting solutions.   He mentions pulm stated they're seeing patients hospitalized 50/50 vaccinated vs unvaccinated. This is not consistent with local or national published data. Will check with pulm to see if [place] patterns are different than national trend.  Problem List Items Addressed This Visit    Severe  obesity (BMI 35.0-39.9) with comorbidity ([place])   Pneumonia due to COVID-19 virus - Primary    Reviewed recent hospitalization. Appreciate pulm care - reassuringly seems to be improving albeit slowly, no longer with oxygen requirement. He received mAb infusion, remdesivir, and decadron as well as abx and prednisone taper. Encouraged pulm f/u as already planned. Has been started on colchicine. Continues vit C, D, zinc. Encouraged pulm f/u as already planned.  He's not sure if he's going to get COVID vaccine. I encouraged he get it.       Relevant Orders   Basic metabolic panel   CBC with Differential/Platelet   Erectile dysfunction    Requests cialis refilled.           Meds ordered this encounter  Medications  . tadalafil (CIALIS) 20 MG tablet    Sig: Take 1 tablet (20 mg total) by mouth daily as needed for erectile dysfunction.    Dispense:  6 tablet    Refill:  11   Orders Placed This Encounter  Procedures  . Basic metabolic panel  . CBC with Differential/Platelet    Patient Instructions  I'm glad you're doing better! Continue current treatment plan.  Labs today.  Slowly advance activity as tolerated. Do consider COVID vaccine after completing 90 days from antibody infusion treatment.    Follow up plan: Return if symptoms worsen or fail to improve.  [name], MD

## 2019-08-22 NOTE — Patient Instructions (Signed)
I'm glad you're doing better! Continue current treatment plan.  Labs today.  Slowly advance activity as tolerated. Do consider COVID vaccine after completing 90 days from antibody infusion treatment.

## 2019-08-22 NOTE — Assessment & Plan Note (Addendum)
Reviewed recent hospitalization. Appreciate pulm care - reassuringly seems to be improving albeit slowly, no longer with oxygen requirement. He received mAb infusion, remdesivir, and decadron as well as abx and prednisone taper. Encouraged pulm f/u as already planned. Has been started on colchicine. Continues vit C, D, zinc. Encouraged pulm f/u as already planned.  He's not sure if he's going to get COVID vaccine. I encouraged he get it.

## 2019-08-22 NOTE — Assessment & Plan Note (Signed)
Requests cialis refilled.  

## 2019-09-04 ENCOUNTER — Encounter: Payer: Self-pay | Admitting: Family Medicine

## 2019-09-12 ENCOUNTER — Encounter: Payer: Self-pay | Admitting: Family Medicine

## 2019-09-13 DIAGNOSIS — U071 COVID-19: Secondary | ICD-10-CM | POA: Diagnosis not present

## 2019-10-03 ENCOUNTER — Other Ambulatory Visit: Payer: Self-pay

## 2019-10-03 ENCOUNTER — Ambulatory Visit (INDEPENDENT_AMBULATORY_CARE_PROVIDER_SITE_OTHER): Payer: BC Managed Care – PPO

## 2019-10-03 ENCOUNTER — Encounter: Payer: Self-pay | Admitting: Podiatry

## 2019-10-03 ENCOUNTER — Other Ambulatory Visit: Payer: Self-pay | Admitting: Podiatry

## 2019-10-03 ENCOUNTER — Ambulatory Visit (INDEPENDENT_AMBULATORY_CARE_PROVIDER_SITE_OTHER): Payer: BC Managed Care – PPO | Admitting: Podiatry

## 2019-10-03 DIAGNOSIS — M722 Plantar fascial fibromatosis: Secondary | ICD-10-CM

## 2019-10-03 DIAGNOSIS — M779 Enthesopathy, unspecified: Secondary | ICD-10-CM

## 2019-10-03 MED ORDER — MELOXICAM 15 MG PO TABS
15.0000 mg | ORAL_TABLET | Freq: Every day | ORAL | 1 refills | Status: DC
Start: 2019-10-03 — End: 2019-11-24

## 2019-10-03 NOTE — Progress Notes (Signed)
   Subjective: 54 y.o. male presenting for a new complaint regarding left heel pain is been going on for several months now.  Patient does have a history of right plantar fasciitis which was alleviated with injections and night splint with meloxicam.  He states that the left heel has been painful for approximately 2-3 weeks now.  More painful in the mornings when getting out of bed.  He presents for further treatment evaluation.  He denies injury.   Past Medical History:  Diagnosis Date  . Anemia   . Complication of anesthesia 2005   WOKE UP DURING INGUINAL HERNIA SURGERY  . 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 Gout   . History of chicken pox   . Pneumonia due to COVID-19 virus 07/2019   hospitalization     Objective: Physical Exam General: The patient is alert and oriented x3 in no acute distress.  Dermatology: Skin is warm, dry and supple bilateral lower extremities. Negative for open lesions or macerations bilateral.   Vascular: Dorsalis Pedis and Posterior Tibial pulses palpable bilateral.  Capillary fill time is immediate to all digits.  Neurological: Epicritic and protective threshold intact bilateral.   Musculoskeletal: Tenderness to palpation to the plantar aspect of the left heel along the plantar fascia. All other joints range of motion within normal limits bilateral. Strength 5/5 in all groups bilateral.   Radiographic exam: Normal osseous mineralization. Joint spaces preserved. No fracture/dislocation/boney destruction. No other soft tissue abnormalities or radiopaque foreign bodies.   Assessment: 1. Plantar fasciitis left foot  Plan of Care:  1. Patient evaluated. Xrays reviewed.   2. Injection of 0.5cc Celestone soluspan injected into the left plantar fascia.  Injection was administered directly plantar on the heel 3.Rx for Meloxicam ordered for patient. 4.  Continue night splint nightly 5. Instructed patient regarding therapies and modalities  at home to alleviate symptoms.  6. Return to clinic in 4 weeks.     Edrick Kins, DPM [facility]  Dr. Edrick Kins, DPM    [address], [place] [address]                Office [phone]  Fax [phone]

## 2019-10-09 ENCOUNTER — Encounter: Payer: Self-pay | Admitting: Family Medicine

## 2019-10-09 NOTE — Telephone Encounter (Signed)
Are you willing to allow for an extra PE on 10/4 or 10/5 for pt's son, Bruce Wang?

## 2019-10-09 NOTE — Telephone Encounter (Signed)
Ok to add extra PE session on one of those days. Thanks.

## 2019-10-09 NOTE — Telephone Encounter (Signed)
Noted.  Bruce Wang has been added on 10/14/19 at 9:00.  Dad is aware.

## 2019-11-11 ENCOUNTER — Ambulatory Visit (INDEPENDENT_AMBULATORY_CARE_PROVIDER_SITE_OTHER): Payer: BC Managed Care – PPO | Admitting: Podiatry

## 2019-11-11 ENCOUNTER — Other Ambulatory Visit: Payer: Self-pay

## 2019-11-11 DIAGNOSIS — F4323 Adjustment disorder with mixed anxiety and depressed mood: Secondary | ICD-10-CM | POA: Diagnosis not present

## 2019-11-11 DIAGNOSIS — M722 Plantar fascial fibromatosis: Secondary | ICD-10-CM

## 2019-11-11 NOTE — Progress Notes (Signed)
   Subjective: 54 y.o. male presenting for follow up of plantar fasciitis to the left heel.  He continues to take meloxicam and wear the night splint every evening.  He states that the injection helped for approximately 5-6 weeks however it is slowly recurred.  He presents to have another injection and for further treatment evaluation.  No new complaints this time   Past Medical History:  Diagnosis Date  . Anemia   . Complication of anesthesia 2005   WOKE UP DURING INGUINAL HERNIA SURGERY  . 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 Gout   . History of chicken pox   . Pneumonia due to COVID-19 virus 07/2019   hospitalization     Objective: Physical Exam General: The patient is alert and oriented x3 in no acute distress.  Dermatology: Skin is warm, dry and supple bilateral lower extremities. Negative for open lesions or macerations bilateral.   Vascular: Dorsalis Pedis and Posterior Tibial pulses palpable bilateral.  Capillary fill time is immediate to all digits.  Neurological: Epicritic and protective threshold intact bilateral.   Musculoskeletal: Tenderness to palpation to the plantar aspect of the left heel along the plantar fascia. All other joints range of motion within normal limits bilateral. Strength 5/5 in all groups bilateral.   Assessment: 1. Plantar fasciitis left foot  Plan of Care:  1. Patient evaluated.    2. Injection of 0.5cc Celestone soluspan injected into the left plantar fascia.  Injection was administered directly plantar on the heel 3.continue meloxicam daily 4.  Continue night splint nightly 5. Instructed patient regarding therapies and modalities at home to alleviate symptoms.  6. Return to clinic in 4 weeks.    *Works for a [employer]  Edrick Kins, DPM [facility]  Dr. Edrick Kins, DPM    [address], [place] [address]                Office [phone]  Fax [phone]

## 2019-11-12 ENCOUNTER — Other Ambulatory Visit: Payer: Self-pay | Admitting: Family Medicine

## 2019-11-12 DIAGNOSIS — J302 Other seasonal allergic rhinitis: Secondary | ICD-10-CM

## 2019-11-12 NOTE — Telephone Encounter (Signed)
E-scribed refill.  Plz schedule cpe and labs for additional refills.

## 2019-11-23 ENCOUNTER — Other Ambulatory Visit: Payer: Self-pay | Admitting: Podiatry

## 2019-11-24 DIAGNOSIS — F4323 Adjustment disorder with mixed anxiety and depressed mood: Secondary | ICD-10-CM | POA: Diagnosis not present

## 2019-11-24 NOTE — Telephone Encounter (Signed)
Please advise 

## 2019-11-25 ENCOUNTER — Ambulatory Visit (INDEPENDENT_AMBULATORY_CARE_PROVIDER_SITE_OTHER): Payer: BC Managed Care – PPO | Admitting: Family Medicine

## 2019-11-25 ENCOUNTER — Encounter: Payer: Self-pay | Admitting: Family Medicine

## 2019-11-25 ENCOUNTER — Other Ambulatory Visit: Payer: Self-pay

## 2019-11-25 VITALS — BP 120/82 | HR 89 | Temp 97.6°F | Ht 72.0 in | Wt 285.3 lb

## 2019-11-25 DIAGNOSIS — M1A0720 Idiopathic chronic gout, left ankle and foot, without tophus (tophi): Secondary | ICD-10-CM

## 2019-11-25 DIAGNOSIS — E538 Deficiency of other specified B group vitamins: Secondary | ICD-10-CM | POA: Diagnosis not present

## 2019-11-25 DIAGNOSIS — E559 Vitamin D deficiency, unspecified: Secondary | ICD-10-CM

## 2019-11-25 DIAGNOSIS — Z125 Encounter for screening for malignant neoplasm of prostate: Secondary | ICD-10-CM

## 2019-11-25 DIAGNOSIS — J302 Other seasonal allergic rhinitis: Secondary | ICD-10-CM

## 2019-11-25 DIAGNOSIS — Z1159 Encounter for screening for other viral diseases: Secondary | ICD-10-CM | POA: Diagnosis not present

## 2019-11-25 DIAGNOSIS — Z0000 Encounter for general adult medical examination without abnormal findings: Secondary | ICD-10-CM | POA: Diagnosis not present

## 2019-11-25 DIAGNOSIS — Z9884 Bariatric surgery status: Secondary | ICD-10-CM | POA: Diagnosis not present

## 2019-11-25 DIAGNOSIS — K760 Fatty (change of) liver, not elsewhere classified: Secondary | ICD-10-CM

## 2019-11-25 DIAGNOSIS — E611 Iron deficiency: Secondary | ICD-10-CM

## 2019-11-25 DIAGNOSIS — J301 Allergic rhinitis due to pollen: Secondary | ICD-10-CM

## 2019-11-25 LAB — HEPATIC FUNCTION PANEL
ALT: 14 U/L (ref 0–53)
AST: 19 U/L (ref 0–37)
Albumin: 4.2 g/dL (ref 3.5–5.2)
Alkaline Phosphatase: 61 U/L (ref 39–117)
Bilirubin, Direct: 0.1 mg/dL (ref 0.0–0.3)
Total Bilirubin: 0.4 mg/dL (ref 0.2–1.2)
Total Protein: 6.3 g/dL (ref 6.0–8.3)

## 2019-11-25 LAB — LIPID PANEL
Cholesterol: 154 mg/dL (ref 0–200)
HDL: 79.7 mg/dL (ref >39.00)
LDL Cholesterol: 62 mg/dL (ref 0–99)
NonHDL: 74.47
Total CHOL/HDL Ratio: 2
Triglycerides: 60 mg/dL (ref 0.0–149.0)
VLDL: 12 mg/dL (ref 0.0–40.0)

## 2019-11-25 LAB — VITAMIN D 25 HYDROXY (VIT D DEFICIENCY, FRACTURES): VITD: 32.76 ng/mL (ref 30.00–100.00)

## 2019-11-25 LAB — URIC ACID: Uric Acid, Serum: 8.5 mg/dL — ABNORMAL HIGH (ref 4.0–7.8)

## 2019-11-25 LAB — IBC PANEL
Iron: 27 ug/dL — ABNORMAL LOW (ref 42–165)
Saturation Ratios: 6.1 % — ABNORMAL LOW (ref 20.0–50.0)
Transferrin: 315 mg/dL (ref 212.0–360.0)

## 2019-11-25 LAB — FOLATE: Folate: 11.7 ng/mL (ref >5.9)

## 2019-11-25 LAB — FERRITIN: Ferritin: 10.5 ng/mL — ABNORMAL LOW (ref 22.0–322.0)

## 2019-11-25 LAB — VITAMIN B12: Vitamin B-12: 614 pg/mL (ref 211–911)

## 2019-11-25 LAB — HEMOGLOBIN A1C: Hgb A1c MFr Bld: 5.4 % (ref 4.6–6.5)

## 2019-11-25 LAB — TSH: TSH: 1.32 u[IU]/mL (ref 0.35–4.50)

## 2019-11-25 LAB — PSA: PSA: 0.62 ng/mL (ref 0.10–4.00)

## 2019-11-25 MED ORDER — ALLOPURINOL 300 MG PO TABS: 300.0000 mg | ORAL_TABLET | Freq: Every day | ORAL | 3 refills | Status: DC

## 2019-11-25 MED ORDER — AZELASTINE HCL 0.1 % NA SOLN
1.0000 | Freq: Two times a day (BID) | NASAL | 11 refills | Status: DC
Start: 2019-11-25 — End: 2020-12-13

## 2019-11-25 NOTE — Assessment & Plan Note (Addendum)
Update labs.  Encouraged decreased alcohol intake. Stress related increased consumption.

## 2019-11-25 NOTE — Assessment & Plan Note (Signed)
Update levels. 

## 2019-11-25 NOTE — Patient Instructions (Addendum)
Labs today  Consider shingrix vaccine  Good to see you today  Return as needed or in 1 year for next physical.   Health Maintenance, Male Adopting a healthy lifestyle and getting preventive care are important in promoting health and wellness. Ask your health care provider about:  The right schedule for you to have regular tests and exams.  Things you can do on your own to prevent diseases and keep yourself healthy. What should I know about diet, weight, and exercise? Eat a healthy diet   Eat a diet that includes plenty of vegetables, fruits, low-fat dairy products, and lean protein.  Do not eat a lot of foods that are high in solid fats, added sugars, or sodium. Maintain a healthy weight Body mass index (BMI) is a measurement that can be used to identify possible weight problems. It estimates body fat based on height and weight. Your health care provider can help determine your BMI and help you achieve or maintain a healthy weight. Get regular exercise Get regular exercise. This is one of the most important things you can do for your health. Most adults should:  Exercise for at least 150 minutes each week. The exercise should increase your heart rate and make you sweat (moderate-intensity exercise).  Do strengthening exercises at least twice a week. This is in addition to the moderate-intensity exercise.  Spend less time sitting. Even light physical activity can be beneficial. Watch cholesterol and blood lipids Have your blood tested for lipids and cholesterol at 53 years of age, then have this test every 5 years. You may need to have your cholesterol levels checked more often if:  Your lipid or cholesterol levels are high.  You are older than 53 years of age.  You are at high risk for heart disease. What should I know about cancer screening? Many types of cancers can be detected early and may often be prevented. Depending on your health history and family history, you may need  to have cancer screening at various ages. This may include screening for:  Colorectal cancer.  Prostate cancer.  Skin cancer.  Lung cancer. What should I know about heart disease, diabetes, and high blood pressure? Blood pressure and heart disease  High blood pressure causes heart disease and increases the risk of stroke. This is more likely to develop in people who have high blood pressure readings, are of African descent, or are overweight.  Talk with your health care provider about your target blood pressure readings.  Have your blood pressure checked: ? Every 3-5 years if you are 33-69 years of age. ? Every year if you are 47 years old or older.  If you are between the ages of 45 and 4 and are a current or former smoker, ask your health care provider if you should have a one-time screening for abdominal aortic aneurysm (AAA). Diabetes Have regular diabetes screenings. This checks your fasting blood sugar level. Have the screening done:  Once every three years after age 67 if you are at a normal weight and have a low risk for diabetes.  More often and at a younger age if you are overweight or have a high risk for diabetes. What should I know about preventing infection? Hepatitis B If you have a higher risk for hepatitis B, you should be screened for this virus. Talk with your health care provider to find out if you are at risk for hepatitis B infection. Hepatitis C Blood testing is recommended for:  Everyone  born from [birth year] through 1964.  Anyone with known risk factors for hepatitis C. Sexually transmitted infections (STIs)  You should be screened each year for STIs, including gonorrhea and chlamydia, if: ? You are sexually active and are younger than 53 years of age. ? You are older than 53 years of age and your health care provider tells you that you are at risk for this type of infection. ? Your sexual activity has changed since you were last screened, and you are at  increased risk for chlamydia or gonorrhea. Ask your health care provider if you are at risk.  Ask your health care provider about whether you are at high risk for HIV. Your health care provider may recommend a prescription medicine to help prevent HIV infection. If you choose to take medicine to prevent HIV, you should first get tested for HIV. You should then be tested every 3 months for as long as you are taking the medicine. Follow these instructions at home: Lifestyle  Do not use any products that contain nicotine or tobacco, such as cigarettes, e-cigarettes, and chewing tobacco. If you need help quitting, ask your health care provider.  Do not use street drugs.  Do not share needles.  Ask your health care provider for help if you need support or information about quitting drugs. Alcohol use  Do not drink alcohol if your health care provider tells you not to drink.  If you drink alcohol: ? Limit how much you have to 0-2 drinks a day. ? Be aware of how much alcohol is in your drink. In the [place] one drink equals one 12 oz bottle of beer (355 mL), one 5 oz glass of wine (148 mL), or one 1 oz glass of hard liquor (44 mL). General instructions  Schedule regular health, dental, and eye exams.  Stay current with your vaccines.  Tell your health care provider if: ? You often feel depressed. ? You have ever been abused or do not feel safe at home. Summary  Adopting a healthy lifestyle and getting preventive care are important in promoting health and wellness.  Follow your health care provider's instructions about healthy diet, exercising, and getting tested or screened for diseases.  Follow your health care provider's instructions on monitoring your cholesterol and blood pressure. This information is not intended to replace advice given to you by your health care provider. Make sure you discuss any questions you have with your health care provider. Document Revised: 12/19/2017  Document Reviewed: 12/19/2017 Elsevier Patient Education  2020 [employer].

## 2019-11-25 NOTE — Assessment & Plan Note (Addendum)
Encouraged healthy diet and lifestyle choices to affect sustainable weight loss.  ?

## 2019-11-25 NOTE — Progress Notes (Signed)
Patient ID: Bruce Wang, male    DOB: [birth year], 54 y.o.   MRN: [id number]  This visit was conducted in person.  BP 120/82 (BP Location: Left Arm, Patient Position: Sitting, Cuff Size: Large)   Pulse 89   Temp 97.6 F (36.4 C) (Temporal)   Ht 6' (1.829 m)   Wt 285 lb 5 oz (129.4 kg)   SpO2 97%   BMI 38.70 kg/m    CC: CPE Subjective:   HPI: Bruce Wang is a 54 y.o. male presenting on 11/25/2019 for Medication Refill (Here for f/u of azelastine. )   FLP normal 11/2017. No fmhx cad or stroke.  Low b12 levels - on oral vit b12 replacement 550mcg MWF.  Going through breakup from fiancee  Preventative: COLONOSCOPY WITH PROPOFOL 07/20/2016 - 1 TA, rpt 5 yrs ([place]) Prostate cancer screening - fmhx (paternal uncle). Discussed. Will check PSA.  Lung cancer screening - not eligible Flu shot - did not receive. Tdap 2013 COVID vaccine - declines, but considering due to work requirements Shingrix vaccine - discussed.  Seat belt use discussed Sunscreen use discussed. No changing moles on skin. Non smoker Alcohol - 6 beers daily - recent increase after stressful breakul - planning to see counselor  Dentist yearly  Eye exam yearly   Caffeine:16-24 ozcoffee/day Lives with wife, 2 children, 2 dogs Occupation:[employer] - working days  Edu: 2 yrs college Activity: walkingdaily Diet: poor at this time - planning to restart healthy choices once settled.      Relevant past medical, surgical, family and social history reviewed and updated as indicated. Interim medical history since our last visit reviewed. Allergies and medications reviewed and updated. Outpatient Medications Prior to Visit  Medication Sig Dispense Refill  . cetirizine (ZYRTEC) 10 MG tablet Take 10 mg by mouth at bedtime.     . Cholecalciferol (VITAMIN D) 2000 units CAPS Take 2,000 Units by mouth daily.     . fluticasone (FLONASE) 50 MCG/ACT nasal spray INSTILL 2 SPRAYS INTO BOTH NOSTRILS DAILY 48 mL 1   . ipratropium (ATROVENT) 0.03 % nasal spray SMARTSIG:1 Both Nares Every Night    . meloxicam (MOBIC) 15 MG tablet TAKE 1 TABLET BY MOUTH EVERY DAY 30 tablet 1  . tadalafil (CIALIS) 20 MG tablet Take 1 tablet (20 mg total) by mouth daily as needed for erectile dysfunction. 6 tablet 11  . vitamin B-12 (V-R VITAMIN B-12) 500 MCG tablet Take 1 tablet (500 mcg total) by mouth every Monday, Wednesday, and Friday.    . vitamin C (ASCORBIC ACID) 500 MG tablet Take 500 mg by mouth daily.    Marland Kitchen allopurinol (ZYLOPRIM) 300 MG tablet TAKE 1 TABLET BY MOUTH EVERY DAY 90 tablet 0  . azelastine (ASTELIN) 0.1 % nasal spray Place 2 sprays into both nostrils 2 (two) times daily. Use in each nostril as directed 30 mL 5  . zinc sulfate 220 (50 Zn) MG capsule Take 1 capsule (220 mg total) by mouth daily. 90 capsule 0   No facility-administered medications prior to visit.     Per HPI unless specifically indicated in ROS section below Review of Systems  Constitutional: Negative for activity change, appetite change, chills, fatigue, fever and unexpected weight change.  HENT: Negative for hearing loss.   Eyes: Negative for visual disturbance.  Respiratory: Negative for cough, chest tightness, shortness of breath and wheezing.   Cardiovascular: Negative for chest pain, palpitations and leg swelling.  Gastrointestinal: Negative for abdominal distention, abdominal pain,  blood in stool, constipation, diarrhea, nausea and vomiting.  Genitourinary: Negative for difficulty urinating and hematuria.  Musculoskeletal: Negative for arthralgias, myalgias and neck pain.  Skin: Negative for rash.  Neurological: Negative for dizziness, seizures, syncope and headaches.  Hematological: Negative for adenopathy. Does not bruise/bleed easily.  Psychiatric/Behavioral: Positive for dysphoric mood. The patient is nervous/anxious.    Objective:  BP 120/82 (BP Location: Left Arm, Patient Position: Sitting, Cuff Size: Large)   Pulse 89    Temp 97.6 F (36.4 C) (Temporal)   Ht 6' (1.829 m)   Wt 285 lb 5 oz (129.4 kg)   SpO2 97%   BMI 38.70 kg/m   Wt Readings from Last 3 Encounters:  11/25/19 285 lb 5 oz (129.4 kg)  08/22/19 280 lb 2 oz (127.1 kg)  08/05/19 (!) 280 lb (127 kg)      Physical Exam Vitals and nursing note reviewed.  Constitutional:      General: He is not in acute distress.    Appearance: Normal appearance. He is well-developed. He is not ill-appearing.  HENT:     Head: Normocephalic and atraumatic.     Right Ear: Hearing, tympanic membrane, ear canal and external ear normal.     Left Ear: Hearing, tympanic membrane, ear canal and external ear normal.  Eyes:     General: No scleral icterus.    Extraocular Movements: Extraocular movements intact.     Conjunctiva/sclera: Conjunctivae normal.     Pupils: Pupils are equal, round, and reactive to light.  Neck:     Thyroid: No thyroid mass or thyromegaly.  Cardiovascular:     Rate and Rhythm: Normal rate and regular rhythm.     Pulses: Normal pulses.          Radial pulses are 2+ on the right side and 2+ on the left side.     Heart sounds: Normal heart sounds. No murmur heard.   Pulmonary:     Effort: Pulmonary effort is normal. No respiratory distress.     Breath sounds: Normal breath sounds. No wheezing, rhonchi or rales.  Abdominal:     General: Abdomen is flat. Bowel sounds are normal. There is no distension.     Palpations: Abdomen is soft. There is no mass.     Tenderness: There is no abdominal tenderness. There is no guarding or rebound.     Hernia: No hernia is present.  Musculoskeletal:        General: Normal range of motion.     Cervical back: Normal range of motion and neck supple.     Right lower leg: No edema.     Left lower leg: No edema.  Lymphadenopathy:     Cervical: No cervical adenopathy.  Skin:    General: Skin is warm and dry.     Findings: No rash.  Neurological:     General: No focal deficit present.     Mental  Status: He is alert and oriented to person, place, and time.     Comments: CN grossly intact, station and gait intact  Psychiatric:        Mood and Affect: Mood normal.        Behavior: Behavior normal.        Thought Content: Thought content normal.        Judgment: Judgment normal.       Results for orders placed or performed in visit on 97/35/32  Basic metabolic panel  Result Value Ref Range   Sodium 140  135 - 145 mEq/L   Potassium 4.3 3.5 - 5.1 mEq/L   Chloride 103 96 - 112 mEq/L   CO2 30 19 - 32 mEq/L   Glucose, Bld 97 70 - 99 mg/dL   BUN 8 6 - 23 mg/dL   Creatinine, Ser 1.03 0.40 - 1.50 mg/dL   GFR 75.54 >60.00 mL/min   Calcium 9.2 8.4 - 10.5 mg/dL  CBC with Differential/Platelet  Result Value Ref Range   WBC 6.4 4.0 - 10.5 K/uL   RBC 4.54 4.22 - 5.81 Mil/uL   Hemoglobin 13.5 13.0 - 17.0 g/dL   HCT 41.7 39 - 52 %   MCV 91.8 78.0 - 100.0 fl   MCHC 32.4 30.0 - 36.0 g/dL   RDW 14.1 11.5 - 15.5 %   Platelets 285.0 150 - 400 K/uL   Neutrophils Relative % 66.1 43 - 77 %   Lymphocytes Relative 20.9 12 - 46 %   Monocytes Relative 8.7 3 - 12 %   Eosinophils Relative 3.4 0 - 5 %   Basophils Relative 0.9 0 - 3 %   Neutro Abs 4.2 1.4 - 7.7 K/uL   Lymphs Abs 1.3 0.7 - 4.0 K/uL   Monocytes Absolute 0.6 0.1 - 1.0 K/uL   Eosinophils Absolute 0.2 0.0 - 0.7 K/uL   Basophils Absolute 0.1 0.0 - 0.1 K/uL   Assessment & Plan:  This visit occurred during the SARS-CoV-2 public health emergency.  Safety protocols were in place, including screening questions prior to the visit, additional usage of staff PPE, and extensive cleaning of exam room while observing appropriate contact time as indicated for disinfecting solutions.   Problem List Items Addressed This Visit    Vitamin D deficiency    Update levels.       Vitamin B12 deficiency    Update B12 levels on MWF replacement.       Relevant Orders   Vitamin B12   Severe obesity (BMI 35.0-39.9) with comorbidity ([place])    Encouraged  healthy diet and lifestyle choices to affect sustainable weight loss.       Iron deficiency    Update iron levels. Not on replacement.       Health maintenance examination - Primary    Preventative protocols reviewed and updated unless pt declined. Discussed healthy diet and lifestyle.       Gout    Stable period on daily allopurinol - continue.       Relevant Orders   Uric acid   Gastric bypass status for obesity    Check labs today.       Relevant Orders   Lipid panel   Hemoglobin A1c   Vitamin B12   Vitamin B1   Ferritin   IBC panel   Folate   VITAMIN D 25 Hydroxy (Vit-D Deficiency, Fractures)   Hepatic function panel   TSH   Fatty liver    Update labs.  Encouraged decreased alcohol intake. Stress related increased consumption.       Allergic rhinitis    Stable period on current regimen of flonase, astelin, atrovent nasal sprays, as well as zyrtec. Has seen ENT, decided against surgery.         Other Visit Diagnoses    Acute seasonal allergic rhinitis       Relevant Medications   allopurinol (ZYLOPRIM) 300 MG tablet   Special screening for malignant neoplasm of prostate       Relevant Orders   PSA   Encounter for hepatitis C  screening test for low risk patient       Relevant Orders   Hepatitis C antibody       Meds ordered this encounter  Medications  . azelastine (ASTELIN) 0.1 % nasal spray    Sig: Place 1 spray into both nostrils 2 (two) times daily. Use in each nostril as directed    Dispense:  30 mL    Refill:  11  . allopurinol (ZYLOPRIM) 300 MG tablet    Sig: Take 1 tablet (300 mg total) by mouth daily.    Dispense:  90 tablet    Refill:  3   Orders Placed This Encounter  Procedures  . Lipid panel  . Hemoglobin A1c  . PSA  . Vitamin B12  . Hepatitis C antibody  . Vitamin B1  . Ferritin  . IBC panel  . Folate  . VITAMIN D 25 Hydroxy (Vit-D Deficiency, Fractures)  . Hepatic function panel  . Uric acid  . TSH    Patient  instructions: Labs today  Consider shingrix vaccine  Good to see you today  Return as needed or in 1 year for next physical.   Follow up plan: Return in about 1 year (around 11/24/2020) for annual exam, prior fasting for blood work.  [name], MD

## 2019-11-25 NOTE — Assessment & Plan Note (Signed)
Stable period on current regimen of flonase, astelin, atrovent nasal sprays, as well as zyrtec. Has seen ENT, decided against surgery.

## 2019-11-25 NOTE — Assessment & Plan Note (Signed)
Preventative protocols reviewed and updated unless pt declined. Discussed healthy diet and lifestyle.  

## 2019-11-25 NOTE — Assessment & Plan Note (Signed)
Update B12 levels on MWF replacement.

## 2019-11-25 NOTE — Assessment & Plan Note (Signed)
Stable period on daily allopurinol - continue.

## 2019-11-25 NOTE — Assessment & Plan Note (Signed)
Update iron levels. Not on replacement.

## 2019-11-25 NOTE — Assessment & Plan Note (Signed)
Check labs today.

## 2019-11-28 LAB — HEPATITIS C ANTIBODY
Hepatitis C Ab: NONREACTIVE
SIGNAL TO CUT-OFF: 0.01 (ref <1.00)

## 2019-11-28 LAB — VITAMIN B1: Vitamin B1 (Thiamine): 8 nmol/L (ref 8–30)

## 2019-12-08 DIAGNOSIS — F4323 Adjustment disorder with mixed anxiety and depressed mood: Secondary | ICD-10-CM | POA: Diagnosis not present

## 2019-12-12 ENCOUNTER — Other Ambulatory Visit: Payer: Self-pay | Admitting: Family Medicine

## 2019-12-16 NOTE — Telephone Encounter (Signed)
Pharmacy requests refill on: Tadalafil 20 mg   LAST REFILL: 08/22/2019 (Q-6, R-11) LAST OV: 11/25/2019 NEXT OV: Not Scheduled  PHARMACY: [place], [place]

## 2019-12-18 DIAGNOSIS — F4323 Adjustment disorder with mixed anxiety and depressed mood: Secondary | ICD-10-CM | POA: Diagnosis not present

## 2019-12-25 ENCOUNTER — Other Ambulatory Visit: Payer: Self-pay | Admitting: Family Medicine

## 2020-01-06 DIAGNOSIS — F4323 Adjustment disorder with mixed anxiety and depressed mood: Secondary | ICD-10-CM | POA: Diagnosis not present

## 2020-01-20 DIAGNOSIS — F4323 Adjustment disorder with mixed anxiety and depressed mood: Secondary | ICD-10-CM | POA: Diagnosis not present

## 2020-02-03 DIAGNOSIS — F4323 Adjustment disorder with mixed anxiety and depressed mood: Secondary | ICD-10-CM | POA: Diagnosis not present

## 2020-02-26 DIAGNOSIS — F4323 Adjustment disorder with mixed anxiety and depressed mood: Secondary | ICD-10-CM | POA: Diagnosis not present

## 2020-03-16 DIAGNOSIS — F4323 Adjustment disorder with mixed anxiety and depressed mood: Secondary | ICD-10-CM | POA: Diagnosis not present

## 2020-03-30 DIAGNOSIS — F4323 Adjustment disorder with mixed anxiety and depressed mood: Secondary | ICD-10-CM | POA: Diagnosis not present

## 2020-04-11 ENCOUNTER — Other Ambulatory Visit: Payer: Self-pay | Admitting: Family Medicine

## 2020-04-13 DIAGNOSIS — F4323 Adjustment disorder with mixed anxiety and depressed mood: Secondary | ICD-10-CM | POA: Diagnosis not present

## 2020-04-27 DIAGNOSIS — F4323 Adjustment disorder with mixed anxiety and depressed mood: Secondary | ICD-10-CM | POA: Diagnosis not present

## 2020-06-30 ENCOUNTER — Other Ambulatory Visit: Payer: Self-pay | Admitting: Family Medicine

## 2020-08-06 ENCOUNTER — Other Ambulatory Visit: Payer: Self-pay

## 2020-08-06 ENCOUNTER — Ambulatory Visit: Payer: BC Managed Care – PPO | Admitting: Podiatry

## 2020-08-06 DIAGNOSIS — M722 Plantar fascial fibromatosis: Secondary | ICD-10-CM | POA: Diagnosis not present

## 2020-08-06 MED ORDER — METHYLPREDNISOLONE 4 MG PO TBPK: ORAL_TABLET | ORAL | 0 refills | Status: DC

## 2020-08-06 MED ORDER — BETAMETHASONE SOD PHOS & ACET 6 (3-3) MG/ML IJ SUSP
3.0000 mg | Freq: Once | INTRAMUSCULAR | Status: AC
  Administered 2020-08-06: 3 mg via INTRA_ARTICULAR

## 2020-08-06 MED ORDER — MELOXICAM 15 MG PO TABS: 15.0000 mg | ORAL_TABLET | Freq: Every day | ORAL | 2 refills | Status: DC

## 2020-08-06 NOTE — Progress Notes (Signed)
   Subjective: 55 y.o. male presenting today for recurrence of an acute flareup of plantar fasciitis to the left foot this been going on for approximately 2-4 weeks.  Patient states over the past month he has been on his feet an increase amount of time at work.  He has noticed increased left heel pain which is causing him to walk with an antalgic gait.  He presents to the office for further treatment and evaluation.  He was last seen in the office on 11/11/2019.   Past Medical History:  Diagnosis Date   Anemia    Complication of anesthesia 2005   WOKE UP DURING INGUINAL HERNIA SURGERY   Gastric bypass status for obesity 2000   roux en y   Gout    History of chicken pox    Pneumonia due to COVID-19 virus 07/2019   hospitalization     Objective: Physical Exam General: The patient is alert and oriented x3 in no acute distress.  Dermatology: Skin is warm, dry and supple bilateral lower extremities. Negative for open lesions or macerations bilateral.   Vascular: Dorsalis Pedis and Posterior Tibial pulses palpable bilateral.  Capillary fill time is immediate to all digits.  Neurological: Epicritic and protective threshold intact bilateral.   Musculoskeletal: Tenderness to palpation to the plantar aspect of the left heel along the plantar fascia. All other joints range of motion within normal limits bilateral. Strength 5/5 in all groups bilateral.    Assessment: 1. Plantar fasciitis left foot  Plan of Care:  1. Patient evaluated. 2. Injection of 0.5cc Celestone soluspan injected into the left plantar fascia.  3. Rx for Medrol Dose Pak placed 4. Rx for Meloxicam ordered for patient. 5.  Continue night splint and OTC insoles in his work boots 6. Instructed patient regarding therapies and modalities at home to alleviate symptoms.  7. Return to clinic as needed  *Works for a [employer]   Edrick Kins, DPM [facility]  Dr. Edrick Kins, DPM    [address], [place] [address]                Office [phone]  Fax [phone]

## 2020-08-22 ENCOUNTER — Other Ambulatory Visit: Payer: Self-pay | Admitting: Family Medicine

## 2020-08-23 NOTE — Telephone Encounter (Signed)
Last refilled on 12/16/19 #6 with 2 refills LOV 11/25/19 CPE  No future appoitments

## 2020-09-02 ENCOUNTER — Encounter: Payer: Self-pay | Admitting: Family Medicine

## 2020-09-03 ENCOUNTER — Ambulatory Visit
Admission: RE | Admit: 2020-09-03 | Discharge: 2020-09-03 | Disposition: A | Discharge status: Home / Self Care | Payer: BC Managed Care – PPO | Source: Ambulatory Visit | Attending: Family Medicine | Admitting: Family Medicine

## 2020-09-03 ENCOUNTER — Ambulatory Visit
Admission: RE | Admit: 2020-09-03 | Discharge: 2020-09-03 | Disposition: A | Discharge status: Home / Self Care | Payer: BC Managed Care – PPO | Attending: Family Medicine | Admitting: Family Medicine

## 2020-09-03 ENCOUNTER — Ambulatory Visit: Payer: BC Managed Care – PPO | Admitting: Family Medicine

## 2020-09-03 ENCOUNTER — Other Ambulatory Visit: Payer: Self-pay

## 2020-09-03 ENCOUNTER — Encounter: Payer: Self-pay | Admitting: Family Medicine

## 2020-09-03 VITALS — BP 126/88 | HR 89 | Temp 98.0°F | Ht 72.0 in | Wt 309.0 lb

## 2020-09-03 DIAGNOSIS — G4700 Insomnia, unspecified: Secondary | ICD-10-CM

## 2020-09-03 DIAGNOSIS — M25551 Pain in right hip: Secondary | ICD-10-CM | POA: Insufficient documentation

## 2020-09-03 DIAGNOSIS — Z789 Other specified health status: Secondary | ICD-10-CM | POA: Insufficient documentation

## 2020-09-03 DIAGNOSIS — Z87898 Personal history of other specified conditions: Secondary | ICD-10-CM

## 2020-09-03 DIAGNOSIS — F1090 Alcohol use, unspecified, uncomplicated: Secondary | ICD-10-CM

## 2020-09-03 HISTORY — DX: Alcohol use, unspecified, uncomplicated: F10.90

## 2020-09-03 HISTORY — DX: Other specified health status: Z78.9

## 2020-09-03 MED ORDER — TRAZODONE HCL 50 MG PO TABS: 25.0000 mg | ORAL_TABLET | Freq: Every evening | ORAL | 3 refills | Status: DC | PRN

## 2020-09-03 MED ORDER — TRAMADOL HCL 50 MG PO TABS: 50.0000 mg | ORAL_TABLET | Freq: Two times a day (BID) | ORAL | 0 refills | Status: AC | PRN

## 2020-09-03 NOTE — Progress Notes (Signed)
Patient ID: Bruce Wang, male    DOB: 1965-01-26, 55 y.o.   MRN: [id number]  This visit was conducted in person.  BP 126/88   Pulse 89   Temp 98 F (36.7 C) (Temporal)   Ht 6' (1.829 m)   Wt (!) 309 lb (140.2 kg)   SpO2 97%   BMI 41.91 kg/m    CC: hip pain, groin pain, back pain on right Subjective:   HPI: Bruce Wang is a 55 y.o. male presenting on 09/03/2020 for Hip Pain, Back Pain, and Groin Pain   2+ week h/o R sided with radiation into groin, as well as R buttock pain. Affecting ability to get socks and shoes on, trouble getting in and out of car and bed. Worse pain at night time.  Denies inciting trauma/injury or fall.  25+ lb weight gain noted.  Denies weakness, numbness, paresthesias down right leg.   Treating with ibuprofen '800mg'$  QID. Tried GF's oxycodone '5mg'$  last night without benefit. Also taking meloxicam '15mg'$  nightly for plantar fasciitis.  This feels similar to when he had sciatica 5+ yrs ago.   Fully stopped drinking 2 wks ago, prior 12 pack + liquor. No withdrawals coming off alcohol.   Ongoing insomnia - melatonin didn't help, benadryl hasn't helped.      Relevant past medical, surgical, family and social history reviewed and updated as indicated. Interim medical history since our last visit reviewed. Allergies and medications reviewed and updated. Outpatient Medications Prior to Visit  Medication Sig Dispense Refill   allopurinol (ZYLOPRIM) 300 MG tablet Take 1 tablet (300 mg total) by mouth daily. 90 tablet 3   azelastine (ASTELIN) 0.1 % nasal spray Place 1 spray into both nostrils 2 (two) times daily. Use in each nostril as directed 30 mL 11   cetirizine (ZYRTEC) 10 MG tablet Take 10 mg by mouth at bedtime.      Cholecalciferol (VITAMIN D) 2000 units CAPS Take 2,000 Units by mouth daily.      fluticasone (FLONASE) 50 MCG/ACT nasal spray INSTILL 2 SPRAYS INTO BOTH NOSTRILS DAILY. 48 mL 2   ipratropium (ATROVENT) 0.03 % nasal spray SMARTSIG:1 Both  Nares Every Night     meloxicam (MOBIC) 15 MG tablet Take 1 tablet (15 mg total) by mouth daily. 30 tablet 2   tadalafil (CIALIS) 20 MG tablet TAKE 1 TABLET BY MOUTH EVERY DAY AS NEEDED FOR ERECTILE DYSFUNCTION 6 tablet 11   vitamin B-12 (V-R VITAMIN B-12) 500 MCG tablet Take 1 tablet (500 mcg total) by mouth every Monday, Wednesday, and Friday.     vitamin C (ASCORBIC ACID) 500 MG tablet Take 500 mg by mouth daily.     methylPREDNISolone (MEDROL DOSEPAK) 4 MG TBPK tablet 6 day dose pack - take as directed 21 tablet 0   No facility-administered medications prior to visit.     Per HPI unless specifically indicated in ROS section below Review of Systems  Objective:  BP 126/88   Pulse 89   Temp 98 F (36.7 C) (Temporal)   Ht 6' (1.829 m)   Wt (!) 309 lb (140.2 kg)   SpO2 97%   BMI 41.91 kg/m   Wt Readings from Last 3 Encounters:  09/03/20 (!) 309 lb (140.2 kg)  11/25/19 285 lb 5 oz (129.4 kg)  08/22/19 280 lb 2 oz (127.1 kg)      Physical Exam Vitals and nursing note reviewed.  Constitutional:      Appearance: Normal appearance. He is  obese. He is not ill-appearing.  Musculoskeletal:        General: Tenderness present. Normal range of motion.     Right lower leg: No edema.     Left lower leg: No edema.     Comments:  No pain midline spine No paraspinous mm tenderness Neg SLR bilaterally. ++ reproducible pain with int>ext rotation at right hip. No significant pain at SIJ, GTB or sciatic notch bilaterally.   Skin:    General: Skin is warm and dry.     Findings: No rash.  Neurological:     Mental Status: He is alert.  Psychiatric:        Mood and Affect: Mood normal.        Behavior: Behavior normal.       Assessment & Plan:  This visit occurred during the SARS-CoV-2 public health emergency.  Safety protocols were in place, including screening questions prior to the visit, additional usage of staff PPE, and extensive cleaning of exam room while observing appropriate  contact time as indicated for disinfecting solutions.   Problem List Items Addressed This Visit     Obesity, morbid, BMI 40.0-49.9 ([place])    Discussed relation of noted weight gain and worsening osteoarthritis.       Insomnia    Ongoing, sleep initiation insomnia. No response to melatonin or to benadryl (has opposite effect).  Reviewed sleep hygiene measures.  Will trial trazodone 25-'50mg'$  nightly as needed for sleep.       Right hip pain - Primary    Anticipate hip pathology causing current pain, and recent cessation of drinking unmasking pain. Also discussed relation of noted weight gain to osteoarthritis pain. He's been taking too much NSAID - advised stop ibuprofen if he continues meloxicam. Will add tylenol scheduled + tramadol for breakthrough pain, reviewing habit forming/dependence/tolerance potential of medication. [place] CSRS reviewed.  Check films and likely refer to ortho pending results.  Pt agrees with plan.       Relevant Orders   DG Hip Unilat W OR W/O Pelvis 2-3 Views Right   History of alcohol use    Was overdrinking 12+ beers/day.  Has stopped drinking in the last 2 weeks.  Encouraged ongoing alcohol abstinence.         Meds ordered this encounter  Medications   traMADol (ULTRAM) 50 MG tablet    Sig: Take 1 tablet (50 mg total) by mouth 2 (two) times daily as needed for up to 5 days.    Dispense:  15 tablet    Refill:  0   traZODone (DESYREL) 50 MG tablet    Sig: Take 0.5-1 tablets (25-50 mg total) by mouth at bedtime as needed for sleep.    Dispense:  30 tablet    Refill:  3   Orders Placed This Encounter  Procedures   DG Hip Unilat W OR W/O Pelvis 2-3 Views Right    Standing Status:   Future    Standing Expiration Date:   09/03/2021    Order Specific Question:   Reason for Exam (SYMPTOM  OR DIAGNOSIS REQUIRED)    Answer:   R hip pain - weight bearing to evaluate for hip osteoarthritis    Order Specific Question:   Preferred imaging location?    Answer:    [facility]     Patient Instructions  I'm suspicious for wear and tear hip arthritis causing this pain. Go to outpatient imaging center at [facility] for [facility] x[facility].  Continue meloxicam '15mg'$  nightly.  Stop ibuprofen. May take tylenol '500mg'$  up to 2 tablets 3 times a day.  May use tramadol as needed for breakthrough pain.  I'm glad you've quit drinking.   Follow up plan: Return if symptoms worsen or fail to improve.  [name], MD

## 2020-09-03 NOTE — Patient Instructions (Addendum)
I'm suspicious for wear and tear hip arthritis causing this pain. Go to outpatient imaging center at [facility] for hip xrays.  Continue meloxicam '15mg'$  nightly.  Stop ibuprofen. May take tylenol '500mg'$  up to 2 tablets 3 times a day.  May use tramadol as needed for breakthrough pain.  I'm glad you've quit drinking.

## 2020-09-03 NOTE — Assessment & Plan Note (Signed)
Anticipate hip pathology causing current pain, and recent cessation of drinking unmasking pain. Also discussed relation of noted weight gain to osteoarthritis pain. He's been taking too much NSAID - advised stop ibuprofen if he continues meloxicam. Will add tylenol scheduled + tramadol for breakthrough pain, reviewing habit forming/dependence/tolerance potential of medication. [place] CSRS reviewed.  Check films and likely refer to ortho pending results.  Pt agrees with plan.

## 2020-09-03 NOTE — Assessment & Plan Note (Signed)
Ongoing, sleep initiation insomnia. No response to melatonin or to benadryl (has opposite effect).  Reviewed sleep hygiene measures.  Will trial trazodone 25-'50mg'$  nightly as needed for sleep.

## 2020-09-03 NOTE — Assessment & Plan Note (Signed)
Was overdrinking 12+ beers/day.  Has stopped drinking in the last 2 weeks.  Encouraged ongoing alcohol abstinence.

## 2020-09-03 NOTE — Assessment & Plan Note (Signed)
Discussed relation of noted weight gain and worsening osteoarthritis.

## 2020-09-05 ENCOUNTER — Other Ambulatory Visit: Payer: Self-pay | Admitting: Family Medicine

## 2020-09-05 DIAGNOSIS — M25551 Pain in right hip: Secondary | ICD-10-CM

## 2020-09-09 ENCOUNTER — Encounter: Payer: Self-pay | Admitting: Family Medicine

## 2020-09-14 MED ORDER — ACETAMINOPHEN-CODEINE #3 300-30 MG PO TABS: 1.0000 | ORAL_TABLET | Freq: Three times a day (TID) | ORAL | 0 refills | Status: DC | PRN

## 2020-09-22 DIAGNOSIS — M62830 Muscle spasm of back: Secondary | ICD-10-CM | POA: Diagnosis not present

## 2020-09-22 DIAGNOSIS — M87051 Idiopathic aseptic necrosis of right femur: Secondary | ICD-10-CM | POA: Diagnosis not present

## 2020-09-22 DIAGNOSIS — M167 Other unilateral secondary osteoarthritis of hip: Secondary | ICD-10-CM | POA: Diagnosis not present

## 2020-09-22 DIAGNOSIS — M25551 Pain in right hip: Secondary | ICD-10-CM | POA: Diagnosis not present

## 2020-10-01 DIAGNOSIS — M1611 Unilateral primary osteoarthritis, right hip: Secondary | ICD-10-CM | POA: Diagnosis not present

## 2020-10-01 DIAGNOSIS — M25451 Effusion, right hip: Secondary | ICD-10-CM | POA: Diagnosis not present

## 2020-10-01 DIAGNOSIS — M94251 Chondromalacia, right hip: Secondary | ICD-10-CM | POA: Diagnosis not present

## 2020-10-01 DIAGNOSIS — M24151 Other articular cartilage disorders, right hip: Secondary | ICD-10-CM | POA: Diagnosis not present

## 2020-10-20 DIAGNOSIS — M87051 Idiopathic aseptic necrosis of right femur: Secondary | ICD-10-CM | POA: Diagnosis not present

## 2020-11-21 ENCOUNTER — Other Ambulatory Visit: Payer: Self-pay | Admitting: Podiatry

## 2020-12-12 ENCOUNTER — Other Ambulatory Visit: Payer: Self-pay | Admitting: Family Medicine

## 2020-12-27 ENCOUNTER — Other Ambulatory Visit: Payer: Self-pay | Admitting: Family Medicine

## 2021-01-14 ENCOUNTER — Other Ambulatory Visit: Payer: Self-pay | Admitting: Family Medicine

## 2021-01-14 DIAGNOSIS — J302 Other seasonal allergic rhinitis: Secondary | ICD-10-CM

## 2021-01-17 NOTE — Telephone Encounter (Signed)
Refill request allopurinol Last refill 11/25/19 #90/3 Last office visit 09/05/20 acute Last CPE 11/25/19 No upcoming appointment scheduled

## 2021-01-18 NOTE — Telephone Encounter (Signed)
ERx 

## 2021-01-20 ENCOUNTER — Other Ambulatory Visit: Payer: Self-pay | Admitting: Family Medicine

## 2021-01-23 ENCOUNTER — Other Ambulatory Visit: Payer: Self-pay | Admitting: Podiatry

## 2021-04-01 ENCOUNTER — Other Ambulatory Visit: Payer: Self-pay | Admitting: Podiatry

## 2021-04-08 ENCOUNTER — Other Ambulatory Visit: Payer: Self-pay | Admitting: Orthopedic Surgery

## 2021-04-19 ENCOUNTER — Encounter
Admission: RE | Admit: 2021-04-19 | Discharge: 2021-04-19 | Disposition: A | Discharge status: Home / Self Care | Payer: Managed Care, Other (non HMO) | Source: Ambulatory Visit | Attending: Orthopedic Surgery | Admitting: Orthopedic Surgery

## 2021-04-19 ENCOUNTER — Other Ambulatory Visit: Payer: Self-pay

## 2021-04-19 VITALS — BP 120/97 | HR 83 | Resp 12 | Ht 72.0 in | Wt 298.5 lb

## 2021-04-19 DIAGNOSIS — Z01818 Encounter for other preprocedural examination: Secondary | ICD-10-CM | POA: Diagnosis present

## 2021-04-19 LAB — URINALYSIS, ROUTINE W REFLEX MICROSCOPIC
Bilirubin Urine: NEGATIVE
Glucose, UA: NEGATIVE mg/dL
Hgb urine dipstick: NEGATIVE
Ketones, ur: 5 mg/dL — AB
Leukocytes,Ua: NEGATIVE
Nitrite: NEGATIVE
Protein, ur: NEGATIVE mg/dL
Specific Gravity, Urine: 1.028 (ref 1.005–1.030)
pH: 5 (ref 5.0–8.0)

## 2021-04-19 LAB — CBC WITH DIFFERENTIAL/PLATELET
Abs Immature Granulocytes: 0.01 10*3/uL (ref 0.00–0.07)
Basophils Absolute: 0 10*3/uL (ref 0.0–0.1)
Basophils Relative: 0 %
Eosinophils Absolute: 0.2 10*3/uL (ref 0.0–0.5)
Eosinophils Relative: 4 %
HCT: 41 % (ref 39.0–52.0)
Hemoglobin: 12.8 g/dL — ABNORMAL LOW (ref 13.0–17.0)
Immature Granulocytes: 0 %
Lymphocytes Relative: 33 %
Lymphs Abs: 1.5 10*3/uL (ref 0.7–4.0)
MCH: 26.3 pg (ref 26.0–34.0)
MCHC: 31.2 g/dL (ref 30.0–36.0)
MCV: 84.2 fL (ref 80.0–100.0)
Monocytes Absolute: 0.4 10*3/uL (ref 0.1–1.0)
Monocytes Relative: 8 %
Neutro Abs: 2.5 10*3/uL (ref 1.7–7.7)
Neutrophils Relative %: 55 %
Platelets: 275 10*3/uL (ref 150–400)
RBC: 4.87 MIL/uL (ref 4.22–5.81)
RDW: 15.1 % (ref 11.5–15.5)
WBC: 4.6 10*3/uL (ref 4.0–10.5)
nRBC: 0 % (ref 0.0–0.2)

## 2021-04-19 LAB — COMPREHENSIVE METABOLIC PANEL
ALT: 16 U/L (ref 0–44)
AST: 18 U/L (ref 15–41)
Albumin: 4 g/dL (ref 3.5–5.0)
Alkaline Phosphatase: 94 U/L (ref 38–126)
Anion gap: 9 (ref 5–15)
BUN: 11 mg/dL (ref 6–20)
CO2: 26 mmol/L (ref 22–32)
Calcium: 9.1 mg/dL (ref 8.9–10.3)
Chloride: 103 mmol/L (ref 98–111)
Creatinine, Ser: 0.9 mg/dL (ref 0.61–1.24)
GFR, Estimated: >60 mL/min (ref >60)
Glucose, Bld: 106 mg/dL — ABNORMAL HIGH (ref 70–99)
Potassium: 3.7 mmol/L (ref 3.5–5.1)
Sodium: 138 mmol/L (ref 135–145)
Total Bilirubin: 0.7 mg/dL (ref 0.3–1.2)
Total Protein: 7.2 g/dL (ref 6.5–8.1)

## 2021-04-19 LAB — SURGICAL PCR SCREEN
MRSA, PCR: NEGATIVE
Staphylococcus aureus: POSITIVE — AB

## 2021-04-19 LAB — TYPE AND SCREEN
ABO/RH(D): O POS
Antibody Screen: NEGATIVE

## 2021-04-19 NOTE — Patient Instructions (Signed)
Your procedure is scheduled on: 04/28/21 Report to [place]. To find out your arrival time please call [phone] between 1PM - 3PM on 04/27/21.  Remember: Instructions that are not followed completely may result in serious medical risk, up to and including death, or upon the discretion of your surgeon and anesthesiologist your surgery may need to be rescheduled.     _X__ 1. Do not eat food after midnight the night before your procedure.                 No gum chewing or hard candies. You may drink clear liquids up to 2 hours                 before you are scheduled to arrive for your surgery- DO not drink clear                 liquids within 2 hours of the start of your surgery.                 Clear Liquids include:  water, apple juice without pulp, clear carbohydrate                 drink such as Clearfast or Gatorade, Black Coffee or Tea (Do not add                 anything to coffee or tea). Diabetics water only  DRINK THE ENSURE PRE SURGERY DRINK 2 HOURS BEFORE ARRIVAL TO HOSPITAL  __X__2.  On the morning of surgery brush your teeth with toothpaste and water, you                 may rinse your mouth with mouthwash if you wish.  Do not swallow any              toothpaste of mouthwash.     _X__ 3.  No Alcohol for 24 hours before or after surgery.   _X__ 4.  Do Not Smoke or use e-cigarettes For 24 Hours Prior to Your Surgery.                 Do not use any chewable tobacco products for at least 6 hours prior to                 surgery.  ____  5.  Bring all medications with you on the day of surgery if instructed.   __X__  6.  Notify your doctor if there is any change in your medical condition      (cold, fever, infections).     Do not wear jewelry, make-up, hairpins, clips or nail polish. Do not wear lotions, powders, or perfumes. MAY WEAR DEODORANT Do not shave body hair 48 hours prior to surgery. Men may shave face and  neck. Do not bring valuables to the hospital.    [facility] is not responsible for any belongings or valuables.  Contacts, dentures/partials or body piercings may not be worn into surgery. Bring a case for your contacts, glasses or hearing aids, a denture cup will be supplied. Leave your suitcase in the car. After surgery it may be brought to your room. For patients admitted to the hospital, discharge time is determined by your treatment team.   Patients discharged the day of surgery will not be allowed to drive home.   Please read over the following fact sheets that you were given:   MRSA  Information, ENSURE, Incentive Spirometer, CHG soap  __X__ Take these medicines the morning of surgery with A SIP OF WATER:    1. None, may take Tramadol  2.   3.   4.  5.  6.  ____ Fleet Enema (as directed)   __X__ Use CHG Soap/SAGE wipes as directed  ____ Use inhalers on the day of surgery  ____ Stop metformin/Janumet/Farxiga 2 days prior to surgery    ____ Take 1/2 of usual insulin dose the night before surgery. No insulin the morning          of surgery.   ____ Stop Blood Thinners Coumadin/Plavix/Xarelto/Pleta/Pradaxa/Eliquis/Effient/Aspirin  on   Or contact your Surgeon, Cardiologist or Medical Doctor regarding  ability to stop your blood thinners  __X__ Stop Anti-inflammatories 7 days before surgery such as Advil, Ibuprofen, Motrin,  BC or Goodies Powder, Naprosyn, Naproxen, Aleve, Aspirin   STOP MELOXICAM 7 DAYS BEFORE SURGERY. May take Tylenol if needed  __X__ Stop all herbals and supplements, fish oil or vitamins  for 7 days until after surgery.    ____ Bring C-Pap to the hospital.

## 2021-04-28 ENCOUNTER — Encounter
Admission: RE | Disposition: A | Discharge status: Home / Self Care | Payer: Self-pay | Source: Home / Self Care | Attending: Orthopedic Surgery

## 2021-04-28 ENCOUNTER — Other Ambulatory Visit: Payer: Self-pay

## 2021-04-28 ENCOUNTER — Observation Stay
Admission: RE | Admit: 2021-04-28 | Discharge: 2021-04-29 | Disposition: A | Discharge status: Home / Self Care | Payer: Managed Care, Other (non HMO) | Attending: Orthopedic Surgery | Admitting: Orthopedic Surgery

## 2021-04-28 ENCOUNTER — Ambulatory Visit: Payer: Managed Care, Other (non HMO) | Admitting: Anesthesiology

## 2021-04-28 ENCOUNTER — Observation Stay: Payer: Managed Care, Other (non HMO)

## 2021-04-28 ENCOUNTER — Ambulatory Visit: Payer: Managed Care, Other (non HMO)

## 2021-04-28 ENCOUNTER — Encounter: Payer: Self-pay | Admitting: Orthopedic Surgery

## 2021-04-28 DIAGNOSIS — Z96641 Presence of right artificial hip joint: Secondary | ICD-10-CM

## 2021-04-28 DIAGNOSIS — M167 Other unilateral secondary osteoarthritis of hip: Secondary | ICD-10-CM | POA: Diagnosis not present

## 2021-04-28 DIAGNOSIS — Z8616 Personal history of COVID-19: Secondary | ICD-10-CM | POA: Insufficient documentation

## 2021-04-28 DIAGNOSIS — M87051 Idiopathic aseptic necrosis of right femur: Secondary | ICD-10-CM | POA: Diagnosis present

## 2021-04-28 DIAGNOSIS — Z96643 Presence of artificial hip joint, bilateral: Secondary | ICD-10-CM

## 2021-04-28 HISTORY — PX: TOTAL HIP ARTHROPLASTY: SHX124

## 2021-04-28 LAB — CREATININE, SERUM
Creatinine, Ser: 1.06 mg/dL (ref 0.61–1.24)
GFR, Estimated: >60 mL/min (ref >60)

## 2021-04-28 LAB — CBC
HCT: 40 % (ref 39.0–52.0)
Hemoglobin: 12 g/dL — ABNORMAL LOW (ref 13.0–17.0)
MCH: 26 pg (ref 26.0–34.0)
MCHC: 30 g/dL (ref 30.0–36.0)
MCV: 86.6 fL (ref 80.0–100.0)
Platelets: 226 10*3/uL (ref 150–400)
RBC: 4.62 MIL/uL (ref 4.22–5.81)
RDW: 14.9 % (ref 11.5–15.5)
WBC: 9.6 10*3/uL (ref 4.0–10.5)
nRBC: 0 % (ref 0.0–0.2)

## 2021-04-28 SURGERY — ARTHROPLASTY, HIP, TOTAL, ANTERIOR APPROACH
Anesthesia: Spinal | Site: Hip | Laterality: Right

## 2021-04-28 MED ORDER — LORATADINE 10 MG PO TABS
ORAL_TABLET | ORAL | Status: AC
  Administered 2021-04-28: 10 mg via ORAL
  Filled 2021-04-28: qty 1

## 2021-04-28 MED ORDER — DEXTROSE 5 % IV SOLN
INTRAVENOUS | Status: DC | PRN
  Administered 2021-04-28: 3 g via INTRAVENOUS

## 2021-04-28 MED ORDER — ALUM & MAG HYDROXIDE-SIMETH 200-200-20 MG/5ML PO SUSP: 30.0000 mL | ORAL | Status: DC | PRN

## 2021-04-28 MED ORDER — MIDAZOLAM HCL 2 MG/2ML IJ SOLN
INTRAMUSCULAR | Status: AC
  Filled 2021-04-28: qty 2

## 2021-04-28 MED ORDER — MORPHINE SULFATE (PF) 2 MG/ML IV SOLN
INTRAVENOUS | Status: AC
  Filled 2021-04-28: qty 1

## 2021-04-28 MED ORDER — METHOCARBAMOL 500 MG PO TABS
ORAL_TABLET | ORAL | Status: AC
  Filled 2021-04-28: qty 1

## 2021-04-28 MED ORDER — ZOLPIDEM TARTRATE 5 MG PO TABS
5.0000 mg | ORAL_TABLET | Freq: Every evening | ORAL | Status: DC | PRN
  Administered 2021-04-28: 5 mg via ORAL

## 2021-04-28 MED ORDER — DOCUSATE SODIUM 100 MG PO CAPS
ORAL_CAPSULE | ORAL | Status: AC
  Administered 2021-04-28: 100 mg via ORAL
  Filled 2021-04-28: qty 1

## 2021-04-28 MED ORDER — HEMOSTATIC AGENTS (NO CHARGE) OPTIME
TOPICAL | Status: DC | PRN
  Administered 2021-04-28: 2 via TOPICAL

## 2021-04-28 MED ORDER — OXYCODONE HCL 5 MG PO TABS
ORAL_TABLET | ORAL | Status: AC
  Filled 2021-04-28: qty 1

## 2021-04-28 MED ORDER — PROPOFOL 500 MG/50ML IV EMUL
INTRAVENOUS | Status: DC | PRN
  Administered 2021-04-28: 200 ug/kg/min via INTRAVENOUS

## 2021-04-28 MED ORDER — ONDANSETRON HCL 4 MG/2ML IJ SOLN
4.0000 mg | Freq: Four times a day (QID) | INTRAMUSCULAR | Status: DC | PRN
  Administered 2021-04-28: 4 mg via INTRAVENOUS

## 2021-04-28 MED ORDER — PROPOFOL 1000 MG/100ML IV EMUL
INTRAVENOUS | Status: AC
  Filled 2021-04-28: qty 100

## 2021-04-28 MED ORDER — ENOXAPARIN SODIUM 40 MG/0.4ML IJ SOSY: 40.0000 mg | PREFILLED_SYRINGE | INTRAMUSCULAR | Status: DC

## 2021-04-28 MED ORDER — METOCLOPRAMIDE HCL 5 MG/ML IJ SOLN: 5.0000 mg | Freq: Three times a day (TID) | INTRAMUSCULAR | Status: DC | PRN

## 2021-04-28 MED ORDER — CEFAZOLIN IN SODIUM CHLORIDE 3-0.9 GM/100ML-% IV SOLN
3.0000 g | INTRAVENOUS | Status: DC
Start: 2021-04-28 — End: 2021-04-28
  Filled 2021-04-28: qty 100

## 2021-04-28 MED ORDER — PANTOPRAZOLE SODIUM 40 MG PO TBEC: 40.0000 mg | DELAYED_RELEASE_TABLET | Freq: Every day | ORAL | Status: DC

## 2021-04-28 MED ORDER — ALLOPURINOL 300 MG PO TABS
300.0000 mg | ORAL_TABLET | Freq: Every day | ORAL | Status: DC
  Administered 2021-04-28: 300 mg via ORAL
  Filled 2021-04-28: qty 1

## 2021-04-28 MED ORDER — PROPOFOL 10 MG/ML IV BOLUS
INTRAVENOUS | Status: DC | PRN
  Administered 2021-04-28 (×2): 40 mg via INTRAVENOUS

## 2021-04-28 MED ORDER — CYANOCOBALAMIN 500 MCG PO TABS: 500.0000 ug | ORAL_TABLET | ORAL | Status: DC

## 2021-04-28 MED ORDER — VITAMIN D3 25 MCG (1000 UNIT) PO TABS
2000.0000 [IU] | ORAL_TABLET | Freq: Every day | ORAL | Status: DC
  Administered 2021-04-28: 2000 [IU] via ORAL
  Filled 2021-04-28: qty 2

## 2021-04-28 MED ORDER — DOCUSATE SODIUM 100 MG PO CAPS: 100.0000 mg | ORAL_CAPSULE | Freq: Two times a day (BID) | ORAL | Status: DC

## 2021-04-28 MED ORDER — RISAQUAD PO CAPS
1.0000 | ORAL_CAPSULE | Freq: Every day | ORAL | Status: DC
  Administered 2021-04-28: 1 via ORAL
  Filled 2021-04-28: qty 1

## 2021-04-28 MED ORDER — LACTATED RINGERS IV SOLN: INTRAVENOUS | Status: DC

## 2021-04-28 MED ORDER — BUPIVACAINE HCL (PF) 0.5 % IJ SOLN
INTRAMUSCULAR | Status: DC | PRN
  Administered 2021-04-28: 3 mL

## 2021-04-28 MED ORDER — BUPIVACAINE LIPOSOME 1.3 % IJ SUSP
INTRAMUSCULAR | Status: AC
  Filled 2021-04-28: qty 20

## 2021-04-28 MED ORDER — TRAMADOL HCL 50 MG PO TABS
ORAL_TABLET | ORAL | Status: AC
  Administered 2021-04-28: 50 mg via ORAL
  Filled 2021-04-28: qty 1

## 2021-04-28 MED ORDER — FAMOTIDINE 20 MG PO TABS
ORAL_TABLET | ORAL | Status: AC
  Administered 2021-04-28: 20 mg via ORAL
  Filled 2021-04-28: qty 1

## 2021-04-28 MED ORDER — SODIUM CHLORIDE FLUSH 0.9 % IV SOLN
INTRAVENOUS | Status: AC
  Filled 2021-04-28: qty 40

## 2021-04-28 MED ORDER — HYDROCODONE-ACETAMINOPHEN 7.5-325 MG PO TABS
ORAL_TABLET | ORAL | Status: AC
  Filled 2021-04-28: qty 2

## 2021-04-28 MED ORDER — MIDAZOLAM HCL 5 MG/5ML IJ SOLN
INTRAMUSCULAR | Status: DC | PRN
  Administered 2021-04-28: 4 mg via INTRAVENOUS

## 2021-04-28 MED ORDER — SODIUM CHLORIDE 0.9 % IV SOLN: INTRAVENOUS | Status: DC

## 2021-04-28 MED ORDER — ASCORBIC ACID 500 MG PO TABS
500.0000 mg | ORAL_TABLET | Freq: Every day | ORAL | Status: DC
  Administered 2021-04-28: 500 mg via ORAL
  Filled 2021-04-28: qty 1

## 2021-04-28 MED ORDER — MORPHINE SULFATE (PF) 4 MG/ML IV SOLN
INTRAVENOUS | Status: AC
  Administered 2021-04-28: 1 mg via INTRAVENOUS
  Filled 2021-04-28: qty 1

## 2021-04-28 MED ORDER — HYDROCODONE-ACETAMINOPHEN 5-325 MG PO TABS
ORAL_TABLET | ORAL | Status: AC
  Filled 2021-04-28: qty 2

## 2021-04-28 MED ORDER — SODIUM CHLORIDE (PF) 0.9 % IJ SOLN
INTRAMUSCULAR | Status: DC | PRN
  Administered 2021-04-28: 90 mL via INTRAMUSCULAR

## 2021-04-28 MED ORDER — ZOLPIDEM TARTRATE 5 MG PO TABS
ORAL_TABLET | ORAL | Status: AC
  Filled 2021-04-28: qty 1

## 2021-04-28 MED ORDER — HYDROCODONE-ACETAMINOPHEN 7.5-325 MG PO TABS
1.0000 | ORAL_TABLET | ORAL | Status: DC | PRN
  Administered 2021-04-28: 2 via ORAL

## 2021-04-28 MED ORDER — OXYCODONE HCL 5 MG/5ML PO SOLN: 5.0000 mg | Freq: Once | ORAL | Status: AC | PRN

## 2021-04-28 MED ORDER — MORPHINE SULFATE (PF) 4 MG/ML IV SOLN
INTRAVENOUS | Status: AC
  Filled 2021-04-28: qty 1

## 2021-04-28 MED ORDER — BUPIVACAINE-EPINEPHRINE (PF) 0.25% -1:200000 IJ SOLN
INTRAMUSCULAR | Status: AC
  Filled 2021-04-28: qty 30

## 2021-04-28 MED ORDER — PHENYLEPHRINE HCL-NACL 20-0.9 MG/250ML-% IV SOLN
INTRAVENOUS | Status: DC | PRN
  Administered 2021-04-28: 70 ug/min via INTRAVENOUS

## 2021-04-28 MED ORDER — FLEET ENEMA 7-19 GM/118ML RE ENEM: 1.0000 | ENEMA | Freq: Once | RECTAL | Status: DC | PRN

## 2021-04-28 MED ORDER — CHLORHEXIDINE GLUCONATE 0.12 % MT SOLN
OROMUCOSAL | Status: AC
  Administered 2021-04-28: 15 mL via OROMUCOSAL
  Filled 2021-04-28: qty 15

## 2021-04-28 MED ORDER — CEFAZOLIN SODIUM-DEXTROSE 2-4 GM/100ML-% IV SOLN
INTRAVENOUS | Status: AC
  Administered 2021-04-28: 2 g via INTRAVENOUS
  Filled 2021-04-28: qty 100

## 2021-04-28 MED ORDER — TRAMADOL HCL 50 MG PO TABS
50.0000 mg | ORAL_TABLET | Freq: Four times a day (QID) | ORAL | Status: DC
  Administered 2021-04-29: 50 mg via ORAL

## 2021-04-28 MED ORDER — MENTHOL 3 MG MT LOZG: 1.0000 | LOZENGE | OROMUCOSAL | Status: DC | PRN

## 2021-04-28 MED ORDER — LORATADINE 10 MG PO TABS: 10.0000 mg | ORAL_TABLET | Freq: Every day | ORAL | Status: DC

## 2021-04-28 MED ORDER — PROPOFOL 10 MG/ML IV BOLUS
INTRAVENOUS | Status: AC
  Filled 2021-04-28: qty 20

## 2021-04-28 MED ORDER — CEFAZOLIN SODIUM-DEXTROSE 2-4 GM/100ML-% IV SOLN
INTRAVENOUS | Status: AC
  Filled 2021-04-28: qty 100

## 2021-04-28 MED ORDER — SENNOSIDES-DOCUSATE SODIUM 8.6-50 MG PO TABS: 1.0000 | ORAL_TABLET | Freq: Every evening | ORAL | Status: DC | PRN

## 2021-04-28 MED ORDER — SODIUM CHLORIDE 0.9 % IR SOLN
Status: DC | PRN
  Administered 2021-04-28: 250 mL

## 2021-04-28 MED ORDER — ORAL CARE MOUTH RINSE: 15.0000 mL | Freq: Once | OROMUCOSAL | Status: AC

## 2021-04-28 MED ORDER — METOCLOPRAMIDE HCL 10 MG PO TABS: 5.0000 mg | ORAL_TABLET | Freq: Three times a day (TID) | ORAL | Status: DC | PRN

## 2021-04-28 MED ORDER — NEOMYCIN-POLYMYXIN B GU 40-200000 IR SOLN
Status: AC
  Filled 2021-04-28: qty 20

## 2021-04-28 MED ORDER — ONDANSETRON HCL 4 MG PO TABS
4.0000 mg | ORAL_TABLET | Freq: Four times a day (QID) | ORAL | Status: DC | PRN
Start: 2021-04-28 — End: 2021-04-29

## 2021-04-28 MED ORDER — BISACODYL 5 MG PO TBEC
5.0000 mg | DELAYED_RELEASE_TABLET | Freq: Every day | ORAL | Status: DC | PRN
  Filled 2021-04-28: qty 1

## 2021-04-28 MED ORDER — CEFAZOLIN SODIUM-DEXTROSE 2-4 GM/100ML-% IV SOLN
2.0000 g | Freq: Four times a day (QID) | INTRAVENOUS | Status: AC
  Administered 2021-04-28: 2 g via INTRAVENOUS

## 2021-04-28 MED ORDER — PHENYLEPHRINE HCL (PRESSORS) 10 MG/ML IV SOLN
INTRAVENOUS | Status: DC | PRN
  Administered 2021-04-28: 80 ug via INTRAVENOUS
  Administered 2021-04-28 (×2): 160 ug via INTRAVENOUS
  Administered 2021-04-28: 80 ug via INTRAVENOUS
  Administered 2021-04-28: 160 ug via INTRAVENOUS

## 2021-04-28 MED ORDER — FENTANYL CITRATE (PF) 100 MCG/2ML IJ SOLN: 25.0000 ug | INTRAMUSCULAR | Status: DC | PRN

## 2021-04-28 MED ORDER — CHLORHEXIDINE GLUCONATE 0.12 % MT SOLN: 15.0000 mL | Freq: Once | OROMUCOSAL | Status: AC

## 2021-04-28 MED ORDER — ACETAMINOPHEN 325 MG PO TABS: 325.0000 mg | ORAL_TABLET | Freq: Four times a day (QID) | ORAL | Status: DC | PRN

## 2021-04-28 MED ORDER — SODIUM CHLORIDE 0.9 % IR SOLN
Status: DC | PRN
  Administered 2021-04-28: 1004 mL

## 2021-04-28 MED ORDER — PHENOL 1.4 % MT LIQD: 1.0000 | OROMUCOSAL | Status: DC | PRN

## 2021-04-28 MED ORDER — METHOCARBAMOL 500 MG PO TABS
500.0000 mg | ORAL_TABLET | Freq: Four times a day (QID) | ORAL | Status: DC | PRN
  Administered 2021-04-28 – 2021-04-29 (×3): 500 mg via ORAL

## 2021-04-28 MED ORDER — METHOCARBAMOL 1000 MG/10ML IJ SOLN
500.0000 mg | Freq: Four times a day (QID) | INTRAVENOUS | Status: DC | PRN
  Filled 2021-04-28: qty 5

## 2021-04-28 MED ORDER — OXYCODONE HCL 5 MG PO TABS
5.0000 mg | ORAL_TABLET | Freq: Once | ORAL | Status: AC | PRN
  Administered 2021-04-28: 5 mg via ORAL

## 2021-04-28 MED ORDER — DIPHENHYDRAMINE HCL 12.5 MG/5ML PO ELIX: 12.5000 mg | ORAL_SOLUTION | ORAL | Status: DC | PRN

## 2021-04-28 MED ORDER — HYDROCODONE-ACETAMINOPHEN 5-325 MG PO TABS
1.0000 | ORAL_TABLET | ORAL | Status: DC | PRN
  Administered 2021-04-28: 2 via ORAL

## 2021-04-28 MED ORDER — FAMOTIDINE 20 MG PO TABS: 20.0000 mg | ORAL_TABLET | Freq: Once | ORAL | Status: AC

## 2021-04-28 MED ORDER — MORPHINE SULFATE (PF) 4 MG/ML IV SOLN
0.5000 mg | INTRAVENOUS | Status: DC | PRN
  Administered 2021-04-28 – 2021-04-29 (×2): 1 mg via INTRAVENOUS

## 2021-04-28 MED ORDER — ONDANSETRON HCL 4 MG/2ML IJ SOLN
INTRAMUSCULAR | Status: AC
  Filled 2021-04-28: qty 2

## 2021-04-28 SURGICAL SUPPLY — 54 items
APL PRP STRL LF DISP 70% ISPRP (MISCELLANEOUS) ×1
BLADE SAGITTAL AGGR TOOTH XLG (BLADE) ×3 IMPLANT
BNDG COHESIVE 6X5 TAN ST LF (GAUZE/BANDAGES/DRESSINGS) ×9 IMPLANT
CANISTER WOUND CARE 500ML ATS (WOUND CARE) ×3 IMPLANT
CHLORAPREP W/TINT 26 (MISCELLANEOUS) ×3 IMPLANT
COVER BACK TABLE REUSABLE LG (DRAPES) ×3 IMPLANT
DRAPE 3/4 80X56 (DRAPES) ×9 IMPLANT
DRAPE C-ARM XRAY 36X54 (DRAPES) ×3 IMPLANT
DRAPE INCISE IOBAN 66X60 STRL (DRAPES) IMPLANT
DRAPE POUCH INSTRU U-SHP 10X18 (DRAPES) ×3 IMPLANT
DRESSING SURGICEL FIBRLLR 1X2 (HEMOSTASIS) ×4 IMPLANT
DRSG MEPILEX SACRM 8.7X9.8 (GAUZE/BANDAGES/DRESSINGS) ×3 IMPLANT
DRSG SURGICEL FIBRILLAR 1X2 (HEMOSTASIS) ×4
ELECT BLADE 6.5 EXT (BLADE) ×3 IMPLANT
ELECT REM PT RETURN 9FT ADLT (ELECTROSURGICAL) ×2
ELECTRODE REM PT RTRN 9FT ADLT (ELECTROSURGICAL) ×2 IMPLANT
GLOVE SURG SYN 9.0  PF PI (GLOVE) ×4
GLOVE SURG SYN 9.0 PF PI (GLOVE) ×4 IMPLANT
GLOVE SURG UNDER POLY LF SZ9 (GLOVE) ×3 IMPLANT
GOWN SRG 2XL LVL 4 RGLN SLV (GOWNS) ×2 IMPLANT
GOWN STRL NON-REIN 2XL LVL4 (GOWNS) ×2
GOWN STRL REUS W/ TWL LRG LVL3 (GOWN DISPOSABLE) ×2 IMPLANT
GOWN STRL REUS W/TWL LRG LVL3 (GOWN DISPOSABLE) ×2
HEAD FEMORAL 28MM SZ S (Head) ×1 IMPLANT
HOLDER FOLEY CATH W/STRAP (MISCELLANEOUS) ×3 IMPLANT
HOOD PEEL AWAY FLYTE STAYCOOL (MISCELLANEOUS) ×3 IMPLANT
KIT PREVENA INCISION MGT 13 (CANNISTER) ×3 IMPLANT
LINER DM 28MM (Liner) ×2 IMPLANT
LINER DM SZH 28X56 (Liner) IMPLANT
MANIFOLD NEPTUNE II (INSTRUMENTS) ×3 IMPLANT
MAT ABSORB  FLUID 56X50 GRAY (MISCELLANEOUS) ×2
MAT ABSORB FLUID 56X50 GRAY (MISCELLANEOUS) ×2 IMPLANT
NDL SPNL 20GX3.5 QUINCKE YW (NEEDLE) ×4 IMPLANT
NEEDLE SPNL 20GX3.5 QUINCKE YW (NEEDLE) ×4 IMPLANT
NS IRRIG 1000ML POUR BTL (IV SOLUTION) ×3 IMPLANT
PACK HIP COMPR (MISCELLANEOUS) ×3 IMPLANT
SCALPEL PROTECTED #10 DISP (BLADE) ×6 IMPLANT
SHELL ACETABULAR DM  56MM (Shell) ×1 IMPLANT
STAPLER SKIN PROX 35W (STAPLE) ×3 IMPLANT
STEM FEM STD CEMENTLESS SZ13 (Stem) ×1 IMPLANT
STRAP SAFETY 5IN WIDE (MISCELLANEOUS) ×3 IMPLANT
SUT DVC 2 QUILL PDO  T11 36X36 (SUTURE) ×2
SUT DVC 2 QUILL PDO T11 36X36 (SUTURE) ×2 IMPLANT
SUT SILK 0 (SUTURE) ×2
SUT SILK 0 30XBRD TIE 6 (SUTURE) ×2 IMPLANT
SUT V-LOC 90 ABS DVC 3-0 CL (SUTURE) ×3 IMPLANT
SUT VIC AB 1 CT1 36 (SUTURE) ×3 IMPLANT
SYR 20ML LL LF (SYRINGE) ×3 IMPLANT
SYR 30ML LL (SYRINGE) ×3 IMPLANT
SYR 50ML LL SCALE MARK (SYRINGE) ×6 IMPLANT
SYR BULB IRRIG 60ML STRL (SYRINGE) ×3 IMPLANT
TAPE MICROFOAM 4IN (TAPE) ×3 IMPLANT
TOWEL OR 17X26 4PK STRL BLUE (TOWEL DISPOSABLE) IMPLANT
TRAY FOLEY MTR SLVR 16FR STAT (SET/KITS/TRAYS/PACK) ×3 IMPLANT

## 2021-04-28 NOTE — Anesthesia Procedure Notes (Signed)
Date/Time: 04/28/2021 10:00 AM ?Performed by: Nelda Marseille, CRNA ?Pre-anesthesia Checklist: Patient identified, Emergency Drugs available, Suction available, Patient being monitored and Timeout performed ?Oxygen Delivery Method: Simple face mask ? ? ? ? ?

## 2021-04-28 NOTE — H&P (Signed)
?Chief Complaint  ?Patient presents with  ? Post Operative Visit  ?H&P. Right THA. 04/28/21.  ? ? ?History of the Present Illness: ?Bruce Wang is a 56 y.o. male here today for history and physical for right total hip arthroplasty with Dr. Hessie Knows on 04/28/2021. Patient has had 1 year of severe right hip pain that is affecting his quality of life and activities of daily living. He has had MRI showing avascular necrosis of the right hip. Patient's pain is located in his buttocks, groin thigh and knee. He is unable to perform activities such as putting on his socks and shoes and getting in and out of low vehicles. He has to walk up stairs 1 step at a time. He has been using a cane. He has a hard time going to the grocery store. His pain is moderate to severe. ? ?The patient has never had blood clots. He is not diabetic. ? ?The patient is employed as an Sales promotion account executive. ? ?I have reviewed past medical, surgical, social and family history, and allergies as documented in the EMR. ? ?Past Medical History: ?Past Medical History:  ?Diagnosis Date  ? Allergies  ? Gout  ? ?Past Surgical History: ?Past Surgical History:  ?Procedure Laterality Date  ? GASTRIC BYPASS OPEN 1999  ? ?Past Family History: ?Family History  ?Problem Relation Age of Onset  ? High blood pressure (Hypertension) Mother  ? High blood pressure (Hypertension) Sister  ? ?Medications: ?Current Outpatient Medications Ordered in Epic  ?Medication Sig Dispense Refill  ? allopurinoL (ZYLOPRIM) 300 MG tablet Take by mouth  ? ascorbic acid, vitamin C, (VITAMIN C) 500 MG tablet Take by mouth  ? AYR SALINE 0.65 % nasal spray  ? azelastine (ASTELIN) 137 mcg nasal spray PLACE 2 SPRAYS INTO BOTH NOSTRILS 2 (TWO) TIMES DAILY. USE IN EACH NOSTRIL AS DIRECTED  ? cetirizine (ZYRTEC) 10 MG tablet Take by mouth  ? cholecalciferol (VITAMIN D3) 2,000 unit capsule Take by mouth  ? colchicine (COLCRYS) 0.6 mg tablet TAKE 1 TABLET (0.6 MG TOTAL) BY MOUTH ONCE DAILY 30 tablet  0  ? cyanocobalamin (VITAMIN B12) 500 MCG tablet Take by mouth  ? fluticasone propionate (FLONASE) 50 mcg/actuation nasal spray INSTILL 2 SPRAYS INTO BOTH NOSTRILS DAILY  ? meloxicam (MOBIC) 15 MG tablet Take 15 mg by mouth once daily  ? tadalafiL (CIALIS) 20 MG tablet TAKE 1 TABLET BY MOUTH EVERY OTHER DAY AS NEEDED FOR ERECTILE DYSFUNCTION. *INSUR 6 TABS/25 DAYS*  ? traMADoL (ULTRAM) 50 mg tablet Take 2 tablets (100 mg total) by mouth every 6 (six) hours as needed for Pain 60 tablet 1  ? ?No current Epic-ordered facility-administered medications on file.  ? ?Allergies: ?No Known Allergies  ? ?Body mass index is 39.14 kg/m?. ? ?Review of Systems: ?A comprehensive 14 point ROS was performed, reviewed, and the pertinent orthopaedic findings are documented in the HPI. ? ?Vitals:  ?04/26/21 1424  ?BP: (!) 148/80  ? ? ?General Physical Examination:  ? ?General:  ?Well developed, well nourished, no apparent distress, normal affect, antalgic gait with a cane. ? ?HEENT: ?Head normocephalic, atraumatic, PERRL.  ? ?Abdomen: ?Soft, non tender, non distended, Bowel sounds present. ? ?Heart: ?Examination of the heart reveals regular, rate, and rhythm. There is no murmur noted on ascultation. There is a normal apical pulse. ? ?Lungs: ?Lungs are clear to auscultation. There is no wheeze, rhonchi, or crackles. There is normal expansion of bilateral chest walls.  ? ?Musculoskeletal Examination:  ?Right hip has range  of motion of 0 to 20 degrees. Negative Homans' sign. No swelling or edema in the right leg. No knee effusion. ? ?Radiographs: ? ?AP pelvis and lateral x-rays of the right hip were ordered and personally reviewed today. These show the right hip femoral head has evidence of significant collapse with shortening and complete loss of central joint space on AP and lateral images. ? ?X-ray Impression ?Collapse of femoral head secondary to avascular necrosis. ? ?AP pelvis and lateral x-rays of the left hip were ordered and  personally reviewed today. These show no collapse. There is some sclerosis and cyst formation consistent with avascular necrosis. ? ?X-ray Impression ?Avascular necrosis without collapse, left hip. ? ?Assessment: ?ICD-10-CM  ?1. Avascular necrosis of right femur (CMS-HCC) M87.051  ?2. Other secondary osteoarthritis of right hip M16.7  ? ?Plan: ? ?56 year old male with advanced right hip AVN/osteoarthritis. Pain is severe and interfering with his quality of life and activities daily living. Risks, benefits, complications of a right total hip arthroplasty have been discussed with the patient. Patient has agreed and consented procedure with Dr. Hessie Knows on 04/28/2021. ? ?Surgical Risks: ? ?The nature of the condition and the proposed procedure has been reviewed in detail with the patient. Surgical versus non-surgical options and prognosis for recovery have been reviewed and the inherent risks and benefits of each have been discussed including the risks of infection, bleeding, injury to nerves/blood vessels/tendons, incomplete relief of symptoms, persisting pain and/or stiffness, loss of function, complex regional pain syndrome, failure of the procedure, as appropriate. ? ? ?Electronically signed by Feliberto Gottron, [place] at 04/26/2021 2:53 PM EDT ? ?Reviewed  H+P. ?No changes noted. ? ? ?

## 2021-04-28 NOTE — Plan of Care (Signed)
Patient and family will verbalize understanding of plan of care ?

## 2021-04-28 NOTE — Anesthesia Procedure Notes (Signed)
Spinal ? ?Patient location during procedure: OR ?Start time: 04/28/2021 9:50 AM ?End time: 04/28/2021 9:55 AM ?Reason for block: surgical anesthesia ?Staffing ?Performed: resident/CRNA  ?Anesthesiologist: Piscitello, Precious Haws, MD ?Resident/CRNA: Nelda Marseille, CRNA ?Preanesthetic Checklist ?Completed: patient identified, IV checked, site marked, risks and benefits discussed, surgical consent, monitors and equipment checked, pre-op evaluation and timeout performed ?Spinal Block ?Patient position: sitting ?Prep: ChloraPrep ?Patient monitoring: heart rate, continuous pulse ox, blood pressure and cardiac monitor ?Approach: midline ?Location: L3-4 ?Injection technique: single-shot ?Needle ?Needle type: Whitacre and Introducer  ?Needle gauge: 25 G ?Needle length: 9 cm ?Assessment ?Sensory level: T10 ?Events: CSF return ?Additional Notes ?Sterile aseptic technique used throughout the procedure.  Negative paresthesia. Negative blood return. Positive free-flowing CSF. Expiration date of kit checked and confirmed. Patient tolerated procedure well, without complications. ? ? ? ? ? ?

## 2021-04-28 NOTE — Evaluation (Signed)
Physical Therapy Evaluation ?Patient Details ?Name: Bruce Wang ?MRN: [id number] ?DOB: [birth year] ?Today's Date: 04/28/2021 ? ?History of Present Illness ? Pt is a 57 y.o. male s/p elective R anterior approach THA due to AVN on 04/28/21. ?  ?Clinical Impression ? Pt admitted with above diagnosis. Pt received upright in bed agreeable to PT. Reports home lay out, PLOF, DME, assist that can be provided without difficulty. Reports indep to mod-I with SPC with all  mobility due to R hip pain. Tolerating LE therex well requiring intermittent AAROM on RLE due to pain and "heaviness" from spinal anesthesia. Overall mod-I for bed mobility and minguard for STS to RW with VC's for hand placement with safe sequencing of RW with step transfer into recliner with excellent eccentric control. All needs in reach. Anticipate pt safe to d/c home with [facility] PT services pending stairs training and progression of gait. Pt currently with functional limitations due to the deficits listed below (see PT Problem List). Pt will benefit from skilled PT to increase their independence and safety with mobility to allow discharge to the venue listed below.    ? ?Recommendations for follow up therapy are one component of a multi-disciplinary discharge planning process, led by the attending physician.  Recommendations may be updated based on patient status, additional functional criteria and insurance authorization. ? ?Follow Up Recommendations Home health PT ? ?  ?Assistance Recommended at Discharge Intermittent Supervision/Assistance  ?Patient can return home with the following ? A little help with walking and/or transfers;Assist for transportation;Help with stairs or ramp for entrance ? ?  ?Equipment Recommendations Rolling walker (2 wheels);BSC/3in1  ?Recommendations for Other Services ?    ?  ?Functional Status Assessment Patient has had a recent decline in their functional status and demonstrates the ability to make significant improvements in  function in a reasonable and predictable amount of time.  ? ?  ?Precautions / Restrictions Precautions ?Precautions: Fall;Anterior Hip ?Precaution Booklet Issued: Yes (comment) ?Restrictions ?Weight Bearing Restrictions: Yes ?RLE Weight Bearing: Weight bearing as tolerated  ? ?  ? ?Mobility ? Bed Mobility ?Overal bed mobility: Modified Independent ?  ?  ?  ?  ?  ?  ?General bed mobility comments: HOB elevated, increased time ?Patient Response: Cooperative ? ?Transfers ?Overall transfer level: Needs assistance ?Equipment used: Rolling walker (2 wheels) ?Transfers: Sit to/from Stand, Bed to chair/wheelchair/BSC ?Sit to Stand: Min guard ?  ?Step pivot transfers: Min guard ?  ?  ?  ?General transfer comment: VC's for hand placement ?  ? ?Ambulation/Gait ?  ?  ?  ?  ?  ?  ?  ?  ? ?Stairs ?  ?  ?  ?  ?  ? ?Wheelchair Mobility ?  ? ?Modified Rankin (Stroke Patients Only) ?  ? ?  ? ?Balance Overall balance assessment: Needs assistance ?Sitting-balance support: Feet supported, No upper extremity supported ?Sitting balance-Leahy Scale: Fair ?  ?  ?Standing balance support: Bilateral upper extremity supported, During functional activity ?Standing balance-Leahy Scale: Fair ?  ?  ?  ?  ?  ?  ?  ?  ?  ?  ?  ?  ?   ? ? ? ?Pertinent Vitals/Pain Pain Assessment ?Pain Assessment: 0-10 ?Pain Score: 3  ?Pain Location: R hip ?Pain Descriptors / Indicators: Aching ?Pain Intervention(s): Limited activity within patient's tolerance  ? ? ?Home Living Family/patient expects to be discharged to:: Private residence ?Living Arrangements: Spouse/significant other ?Available Help at Discharge: Available PRN/intermittently (fiance) ?Type of Home: House ?  Home Access: Stairs to enter ?Entrance Stairs-Rails: Left ?Entrance Stairs-Number of Steps: 4 ?  ?Home Layout: One level ?Home Equipment: Shower seat - built in;Cane - single point ?   ?  ?Prior Function Prior Level of Function : Independent/Modified Independent ?  ?  ?  ?  ?  ?  ?Mobility  Comments: SPC for ambulation last 8 weeks ?  ?  ? ? ?Hand Dominance  ?   ? ?  ?Extremity/Trunk Assessment  ? Upper Extremity Assessment ?Upper Extremity Assessment: Overall WFL for tasks assessed ?  ? ?Lower Extremity Assessment ?Lower Extremity Assessment: Generalized weakness;RLE deficits/detail ?RLE Deficits / Details: R ant THA ?RLE Sensation: decreased light touch ?  ? ?Cervical / Trunk Assessment ?Cervical / Trunk Assessment: Normal  ?Communication  ? Communication: No difficulties  ?Cognition Arousal/Alertness: Awake/alert ?Behavior During Therapy: [facility] for tasks assessed/performed ?Overall Cognitive Status: Within Functional Limits for tasks assessed ?  ?  ?  ?  ?  ?  ?  ?  ?  ?  ?  ?  ?  ?  ?  ?  ?  ?  ?  ? ?  ?General Comments   ? ?  ?Exercises Total Joint Exercises ?Ankle Circles/Pumps: AROM, Strengthening, Both, 10 reps, Supine ?Quad Sets: AROM, Strengthening, Right, 10 reps, Supine ?Gluteal Sets: AROM, Strengthening, Both, 10 reps, Supine ?Hip ABduction/ADduction: AAROM, Strengthening, Right, 10 reps, Supine ?Straight Leg Raises: AAROM, Strengthening, Right, 5 reps, Supine ?Marching in Standing: AROM, Strengthening, Both, 5 reps ?Other Exercises ?Other Exercises: Role of PT in acute setting, D/c recs, WB precautions  ? ?Assessment/Plan  ?  ?PT Assessment Patient needs continued PT services  ?PT Problem List Decreased strength;Decreased range of motion;Decreased knowledge of use of DME;Decreased activity tolerance;Decreased balance;Pain;Decreased mobility;Impaired sensation ? ?   ?  ?PT Treatment Interventions DME instruction;Balance training;Gait training;Neuromuscular re-education;Stair training;Functional mobility training;Patient/family education;Therapeutic activities;Therapeutic exercise   ? ?PT Goals (Current goals can be found in the Care Plan section)  ?Acute Rehab PT Goals ?Patient Stated Goal: to go to fiances house ?PT Goal Formulation: With patient ?Time For Goal Achievement:  05/12/21 ?Potential to Achieve Goals: Good ? ?  ?Frequency BID ?  ? ? ?Co-evaluation   ?  ?  ?  ?  ? ? ?  ?AM-PAC PT "6 Clicks" Mobility  ?Outcome Measure Help needed turning from your back to your side while in a flat bed without using bedrails?: A Little ?Help needed moving from lying on your back to sitting on the side of a flat bed without using bedrails?: A Little ?Help needed moving to and from a bed to a chair (including a wheelchair)?: A Little ?Help needed standing up from a chair using your arms (e.g., wheelchair or bedside chair)?: A Little ?Help needed to walk in hospital room?: A Lot ?Help needed climbing 3-5 steps with a railing? : A Lot ?6 Click Score: 16 ? ?  ?End of Session Equipment Utilized During Treatment: Gait belt ?Activity Tolerance: Patient tolerated treatment well ?Patient left: in chair;with call bell/phone within reach ?Nurse Communication: Mobility status ?PT Visit Diagnosis: Difficulty in walking, not elsewhere classified (R26.2);Muscle weakness (generalized) (M62.81) ?  ? ?Time: 5361-4431 ?PT Time Calculation (min) (ACUTE ONLY): 22 min ? ? ?Charges:   PT Evaluation ?$PT Eval Low Complexity: 1 Low ?PT Treatments ?$Therapeutic Exercise: 8-22 mins ?  ?   ? ?Salem Caster. Fairly IV, PT, DPT ?Physical Therapist- [facility]  ?[facility]  ?04/28/2021, 4:19 PM ? ?

## 2021-04-28 NOTE — Op Note (Signed)
04/28/2021 ? ?11:43 AM ? ?PATIENT:  Bruce Wang  56 y.o. male ? ?PRE-OPERATIVE DIAGNOSIS:  Avascular necrosis of right femur  M87.051 ?Right hip pain  M25.551 ? ?POST-OPERATIVE DIAGNOSIS:  Avascular necrosis of right femur  M87.051 ? ?PROCEDURE:  Procedure(s): ?TOTAL HIP ARTHROPLASTY ANTERIOR APPROACH (Right) ? ?SURGEON: Laurene Footman, MD ? ?ASSISTANTS: None ? ?ANESTHESIA:   spinal ? ?EBL:  Total I/O ?In: 1050 [I.V.:1000; IV Piggyback:50] ?Out: 300 [Urine:100; Blood:200] ? ?BLOOD ADMINISTERED:none ? ?DRAINS:  Incisional wound VAC   ? ?LOCAL MEDICATIONS USED:  MARCAINE    and OTHER Exparel ? ?SPECIMEN:  Source of Specimen:    Right femoral head ? ?DISPOSITION OF SPECIMEN:  PATHOLOGY ? ?COUNTS:  YES ? ?TOURNIQUET:  * No tourniquets in log * ? ?IMPLANTS: Medacta SMS 13 standard collared stem, 56 mm Mpact DM cup and liner with ceramic S 28 mm head ? ?DICTATION: .Dragon Dictation   The patient was brought to the operating room and after spinal anesthesia was obtained patient was placed on the operative table with the ipsilateral foot into the Medacta attachment, contralateral leg on a well-padded table. C-arm was brought in and preop template x-ray taken. After prepping and draping in usual sterile fashion appropriate patient identification and timeout procedures were completed. Anterior approach to the hip was obtained and centered over the greater trochanter and TFL muscle. The subcutaneous tissue was incised hemostasis being achieved by electrocautery. TFL fascia was incised and the muscle retracted laterally deep retractor placed. The lateral femoral circumflex vessels were identified and ligated. The anterior capsule was exposed and a capsulotomy performed. The neck was identified and a femoral neck cut carried out with a saw. The head was removed without difficulty and showed collapsed femoral head and extensive synovitis in the acetabulum. Reaming was carried out to 56 mm and a 56 mm cup trial gave appropriate  tightness to the acetabular component a 56 DM cup was impacted into position. The leg was then externally rotated and ischiofemoral and pubofemoral releases carried out. The femur was sequentially broached to a size 13, size 13 standard with S head trials were placed and the final components chosen. The 13 standard SMS stem was inserted along with a ceramic S 28 mm head and 56 mm liner. The hip was reduced and was stable the wound was thoroughly irrigated with fibrillar placed along the posterior capsule and medial neck. The deep fascia ws closed using a heavy Quill after infiltration of 30 cc of quarter percent Sensorcaine with epinephrine diluted with Exparel throughout the case .3-0 V-loc to close the skin with skin staples.  Incisional wound VAC applied and patient was sent to recovery in stable condition.  ? ?PLAN OF CARE: Admit for overnight observation ? ?

## 2021-04-28 NOTE — TOC Progression Note (Signed)
Transition of Care (TOC) - Progression Note  ? ? ?Patient Details  ?Name: Bruce Wang ?MRN: [id number] ?Date of Birth: 25-Nov-1965 ? ?Transition of Care (TOC) CM/SW Contact  ?Conception Oms, RN ?Phone Number: ?04/28/2021, 1:24 PM ? ?Clinical Narrative:    ? ?The patient is set up with [facility] for [facility] services, set up prior to Surgery by surgeons office ?PT to eval and make recommendations ? ? ?  ?  ? ?Expected Discharge Plan and Services ?  ?  ?  ?  ?  ?                ?  ?  ?  ?  ?  ?  ?  ?  ?  ?  ? ? ?Social Determinants of Health (SDOH) Interventions ?  ? ?Readmission Risk Interventions ?   ? View : No data to display.  ?  ?  ?  ? ? ?

## 2021-04-28 NOTE — Anesthesia Procedure Notes (Signed)
Spinal

## 2021-04-28 NOTE — Anesthesia Preprocedure Evaluation (Signed)
Anesthesia Evaluation  ?Patient identified by MRN, date of birth, ID band ?Patient awake ? ? ? ?Reviewed: ?Allergy & Precautions, NPO status , Patient's Chart, lab work & pertinent test results ? ?History of Anesthesia Complications ?(+) AWARENESS UNDER ANESTHESIA and history of anesthetic complications ? ?Airway ?Mallampati: III ? ?TM Distance: >3 FB ?Neck ROM: full ? ? ? Dental ? ?(+) Chipped ?  ?Pulmonary ?sleep apnea , former smoker,  ?  ?Pulmonary exam normal ? ? ? ? ? ? ? Cardiovascular ?(-) angina(-) Past MI negative cardio ROS ?Normal cardiovascular exam ? ? ?  ?Neuro/Psych ? Neuromuscular disease negative psych ROS  ? GI/Hepatic ?negative GI ROS, Neg liver ROS, neg GERD  ,  ?Endo/Other  ?negative endocrine ROS ? Renal/GU ?negative Renal ROS  ?negative genitourinary ?  ?Musculoskeletal ? ? Abdominal ?  ?Peds ? Hematology ?negative hematology ROS ?(+)   ?Anesthesia Other Findings ?Past Medical History: ?No date: Anemia ?4696: Complication of anesthesia ?    Comment:  WOKE UP DURING INGUINAL HERNIA SURGERY ?2000: Gastric bypass status for obesity ?    Comment:  roux en y ?No date: Gout ?No date: History of chicken pox ?07/2019: Pneumonia due to COVID-19 virus ?    Comment:  hospitalization ? ?Past Surgical History: ?07/20/2016: COLONOSCOPY WITH PROPOFOL; N/A ?    Comment:  1 TA, rpt 5 yrs Allen Norris, Darren, MD) ?07/02/2018: HEMORRHOID SURGERY; N/A ?    Comment:  Procedure: HEMORRHOIDECTOMY;  Surgeon: Jules Husbands,  ?             MD;  Location: [facility] ORS;  Service: General;  Laterality:  ?             N/A; ?1990s: INGUINAL HERNIA REPAIR ?    Comment:  X2 ?07/20/2016: POLYPECTOMY ?    Comment:  Procedure: POLYPECTOMY;  Surgeon: Lucilla Lame, MD;   ?             Location: [place];  Service: Endoscopy;; ?2000: ROUX-EN-Y PROCEDURE ? ?BMI   ? Body Mass Index: 40.48 kg/m?  ?  ? ? Reproductive/Obstetrics ?negative OB ROS ? ?  ? ? ? ? ? ? ? ? ? ? ? ? ? ?  ?   ? ? ? ? ? ? ? ? ?Anesthesia Physical ?Anesthesia Plan ? ?ASA: 3 ? ?Anesthesia Plan: Spinal  ? ?Post-op Pain Management:   ? ?Induction:  ? ?PONV Risk Score and Plan: Propofol infusion and TIVA ? ?Airway Management Planned: Natural Airway and Nasal Cannula ? ?Additional Equipment:  ? ?Intra-op Plan:  ? ?Post-operative Plan:  ? ?Informed Consent: I have reviewed the patients History and Physical, chart, labs and discussed the procedure including the risks, benefits and alternatives for the proposed anesthesia with the patient or authorized representative who has indicated his/her understanding and acceptance.  ? ? ? ?Dental Advisory Given ? ?Plan Discussed with: Anesthesiologist, CRNA and Surgeon ? ?Anesthesia Plan Comments: (Patient reports no bleeding problems and no anticoagulant use. ? ?Plan for spinal with backup GA ? ?Patient consented for risks of anesthesia including but not limited to:  ?- adverse reactions to medications ?- damage to eyes, teeth, lips or other oral mucosa ?- nerve damage due to positioning  ?- risk of bleeding, infection and or nerve damage from spinal that could lead to paralysis ?- risk of headache or failed spinal ?- damage to teeth, lips or other oral mucosa ?- sore throat or hoarseness ?- damage to heart, brain, nerves, lungs, other parts  of body or loss of life ? ?Patient voiced understanding.)  ? ? ? ? ? ? ?Anesthesia Quick Evaluation ? ?

## 2021-04-28 NOTE — Transfer of Care (Signed)
Immediate Anesthesia Transfer of Care Note ? ?Patient: Bruce Wang ? ?Procedure(s) Performed: TOTAL HIP ARTHROPLASTY ANTERIOR APPROACH (Right: Hip) ? ?Patient Location: PACU ? ?Anesthesia Type:Spinal ? ?Level of Consciousness: awake, alert  and oriented ? ?Airway & Oxygen Therapy: Patient Spontanous Breathing and Patient connected to face mask oxygen ? ?Post-op Assessment: Report given to RN and Post -op Vital signs reviewed and stable ? ?Post vital signs: Reviewed and stable ? ?Last Vitals:  ?Vitals Value Taken Time  ?BP 88/67 04/28/21 1138  ?Temp    ?Pulse 102 04/28/21 1140  ?Resp    ?SpO2 99 % 04/28/21 1140  ?Vitals shown include unvalidated device data. ? ?Last Pain:  ?Vitals:  ? 04/28/21 0801  ?TempSrc: Temporal  ?PainSc: 2   ?   ? ?  ? ?Complications: No notable events documented. ?

## 2021-04-28 NOTE — Progress Notes (Signed)
Patient awake/alert x4. Able to move side to side bil lower ext, sensation decreased but present. Indwelling foley catheter patent. ?Reviewed procedure with patient and plan of care, verbalizes understanding. ?Dr. Amie Critchley called regarding sbp 90's at interval high 80's, mean 70's. OK with and ok to transfer.  Patient denies shortness of breath, chest pain, Sr on monitor. No pain at present. ?

## 2021-04-29 ENCOUNTER — Encounter: Payer: Self-pay | Admitting: Orthopedic Surgery

## 2021-04-29 DIAGNOSIS — M87051 Idiopathic aseptic necrosis of right femur: Secondary | ICD-10-CM | POA: Diagnosis not present

## 2021-04-29 LAB — BASIC METABOLIC PANEL
Anion gap: 6 (ref 5–15)
BUN: 10 mg/dL (ref 6–20)
CO2: 25 mmol/L (ref 22–32)
Calcium: 8.3 mg/dL — ABNORMAL LOW (ref 8.9–10.3)
Chloride: 106 mmol/L (ref 98–111)
Creatinine, Ser: 0.87 mg/dL (ref 0.61–1.24)
GFR, Estimated: >60 mL/min (ref >60)
Glucose, Bld: 109 mg/dL — ABNORMAL HIGH (ref 70–99)
Potassium: 4 mmol/L (ref 3.5–5.1)
Sodium: 137 mmol/L (ref 135–145)

## 2021-04-29 LAB — CBC
HCT: 34.1 % — ABNORMAL LOW (ref 39.0–52.0)
Hemoglobin: 10.5 g/dL — ABNORMAL LOW (ref 13.0–17.0)
MCH: 26.2 pg (ref 26.0–34.0)
MCHC: 30.8 g/dL (ref 30.0–36.0)
MCV: 85 fL (ref 80.0–100.0)
Platelets: 211 10*3/uL (ref 150–400)
RBC: 4.01 MIL/uL — ABNORMAL LOW (ref 4.22–5.81)
RDW: 14.8 % (ref 11.5–15.5)
WBC: 7.2 10*3/uL (ref 4.0–10.5)
nRBC: 0 % (ref 0.0–0.2)

## 2021-04-29 LAB — SURGICAL PATHOLOGY

## 2021-04-29 MED ORDER — METHOCARBAMOL 500 MG PO TABS
ORAL_TABLET | ORAL | Status: AC
  Administered 2021-04-29: 500 mg via ORAL
  Filled 2021-04-29: qty 1

## 2021-04-29 MED ORDER — TRAMADOL HCL 50 MG PO TABS
ORAL_TABLET | ORAL | Status: AC
  Filled 2021-04-29: qty 1

## 2021-04-29 MED ORDER — TRAMADOL HCL 50 MG PO TABS
ORAL_TABLET | ORAL | Status: AC
  Administered 2021-04-29: 50 mg via ORAL
  Filled 2021-04-29: qty 1

## 2021-04-29 MED ORDER — HYDROCODONE-ACETAMINOPHEN 7.5-325 MG PO TABS: 1.0000 | ORAL_TABLET | ORAL | 0 refills | Status: DC | PRN

## 2021-04-29 MED ORDER — MORPHINE SULFATE (PF) 4 MG/ML IV SOLN
INTRAVENOUS | Status: AC
  Filled 2021-04-29: qty 1

## 2021-04-29 MED ORDER — METHOCARBAMOL 500 MG PO TABS: 500.0000 mg | ORAL_TABLET | Freq: Four times a day (QID) | ORAL | 0 refills | Status: DC | PRN

## 2021-04-29 MED ORDER — ENOXAPARIN SODIUM 40 MG/0.4ML IJ SOSY
PREFILLED_SYRINGE | INTRAMUSCULAR | Status: AC
  Administered 2021-04-29: 40 mg via SUBCUTANEOUS
  Filled 2021-04-29: qty 0.4

## 2021-04-29 MED ORDER — ENOXAPARIN SODIUM 40 MG/0.4ML IJ SOSY: 40.0000 mg | PREFILLED_SYRINGE | INTRAMUSCULAR | 0 refills | Status: DC

## 2021-04-29 MED ORDER — TRAMADOL HCL 50 MG PO TABS: 50.0000 mg | ORAL_TABLET | Freq: Four times a day (QID) | ORAL | 0 refills | Status: DC | PRN

## 2021-04-29 MED ORDER — DOCUSATE SODIUM 100 MG PO CAPS
ORAL_CAPSULE | ORAL | Status: AC
  Administered 2021-04-29: 100 mg via ORAL
  Filled 2021-04-29: qty 1

## 2021-04-29 MED ORDER — PANTOPRAZOLE SODIUM 40 MG PO TBEC
DELAYED_RELEASE_TABLET | ORAL | Status: AC
Start: 2021-04-29 — End: 2021-04-29
  Administered 2021-04-29: 40 mg via ORAL
  Filled 2021-04-29: qty 1

## 2021-04-29 MED ORDER — METHOCARBAMOL 500 MG PO TABS
ORAL_TABLET | ORAL | Status: AC
  Filled 2021-04-29: qty 1

## 2021-04-29 MED ORDER — HYDROCODONE-ACETAMINOPHEN 7.5-325 MG PO TABS
ORAL_TABLET | ORAL | Status: AC
  Administered 2021-04-29: 2 via ORAL
  Filled 2021-04-29: qty 2

## 2021-04-29 MED ORDER — DOCUSATE SODIUM 100 MG PO CAPS: 100.0000 mg | ORAL_CAPSULE | Freq: Two times a day (BID) | ORAL | 0 refills | Status: DC

## 2021-04-29 MED ORDER — ACETAMINOPHEN 325 MG PO TABS
ORAL_TABLET | ORAL | Status: AC
  Administered 2021-04-29: 650 mg via ORAL
  Filled 2021-04-29: qty 2

## 2021-04-29 NOTE — TOC Progression Note (Signed)
Transition of Care (TOC) - Progression Note  ? ? ?Patient Details  ?Name: Bruce Wang ?MRN: [id number] ?Date of Birth: 08-Sep-1965 ? ?Transition of Care (TOC) CM/SW Contact  ?Daryle Amis E Lavora Brisbon, LCSW ?Phone Number: ?04/29/2021, 9:55 AM ? ?Clinical Narrative:   Therapy working with patient at bedside when [place] went by room. ?Called significant other Charolotte Eke to discuss DME needs. She stated patient does not need a 3 in 1 (has tub bench), but does need a RW. RW ordered through [place] to be delivered to bedside.  ? ? ? ?  ?  ? ?Expected Discharge Plan and Services ?  ?  ?  ?  ?  ?                ?  ?  ?  ?  ?  ?  ?  ?  ?  ?  ? ? ?Social Determinants of Health (SDOH) Interventions ?  ? ?Readmission Risk Interventions ?   ? View : No data to display.  ?  ?  ?  ? ? ?

## 2021-04-29 NOTE — Evaluation (Signed)
Occupational Therapy Evaluation ?Patient Details ?Name: Bruce Wang ?MRN: [id number] ?DOB: Nov 04, 1965 ?Today's Date: 04/29/2021 ? ? ?History of Present Illness Pt is a 56 y.o. male s/p elective R anterior approach THA due to AVN on 04/28/21.  ? ?Clinical Impression ?  ?Pt seen for OT evaluation this date, POD#1 from above surgery. Pt was independent in all ADL prior to surgery, however using SPC x8wks for mobility due to R hip pain. Pt is eager to return to PLOF with less pain and improved safety and independence. Pt currently requires PRN MIN A for LB dressing and bathing while in seated position due to pain and limited AROM of R hip. Fiance able to assist as needed. Pt/fiance instructed in home/routines modifications, falls prevention, pet care considerations, compression stocking mgt, and AE/DME for ADL tasks. Handout provided to support recall and carryover. Both verbalized understanding and denied additional needs. All education/training provided at time of evaluation. Will sign off.    ? ?Recommendations for follow up therapy are one component of a multi-disciplinary discharge planning process, led by the attending physician.  Recommendations may be updated based on patient status, additional functional criteria and insurance authorization.  ? ?Follow Up Recommendations ? No OT follow up  ?  ?Assistance Recommended at Discharge PRN  ?Patient can return home with the following A little help with bathing/dressing/bathroom;Assist for transportation ? ?  ?Functional Status Assessment ? Patient has had a recent decline in their functional status and demonstrates the ability to make significant improvements in function in a reasonable and predictable amount of time.  ?Equipment Recommendations ? None recommended by OT  ?  ?Recommendations for Other Services   ? ? ?  ?Precautions / Restrictions Precautions ?Precautions: Fall;Anterior Hip ?Precaution Booklet Issued: Yes (comment) ?Restrictions ?Weight Bearing  Restrictions: Yes ?RLE Weight Bearing: Weight bearing as tolerated  ? ?  ? ?Mobility Bed Mobility ?  ?  ?  ?  ?  ?  ?  ?  ?  ? ?Transfers ?  ?  ?  ?  ?  ?  ?  ?  ?  ?  ?  ? ?  ?Balance   ?  ?  ?  ?  ?  ?  ?  ?  ?  ?  ?  ?  ?  ?  ?  ?  ?  ?  ?   ? ?ADL either performed or assessed with clinical judgement  ? ?ADL   ?  ?  ?  ?  ?  ?  ?  ?  ?  ?  ?  ?  ?  ?  ?  ?  ?  ?  ?  ?General ADL Comments: Pt currently requires PRN MIN A for LB ADL tasks and fiance able to provide needed level of assist.  ? ? ? ?Vision   ?   ?   ?Perception   ?  ?Praxis   ?  ? ?Pertinent Vitals/Pain Pain Assessment ?Pain Assessment: 0-10 ?Pain Score: 4  ?Pain Location: R hip ?Pain Descriptors / Indicators: Aching ?Pain Intervention(s): Limited activity within patient's tolerance, Monitored during session, Premedicated before session, Ice applied  ? ? ? ?Hand Dominance   ?  ?Extremity/Trunk Assessment Upper Extremity Assessment ?Upper Extremity Assessment: Overall WFL for tasks assessed ?  ?Lower Extremity Assessment ?Lower Extremity Assessment: RLE deficits/detail ?RLE Deficits / Details: R ant THA ?  ?  ?  ?Communication Communication ?Communication: No difficulties ?  ?Cognition Arousal/Alertness: Awake/alert ?Behavior  During Therapy: [facility] for tasks assessed/performed ?Overall Cognitive Status: Within Functional Limits for tasks assessed ?  ?  ?  ?  ?  ?  ?  ?  ?  ?  ?  ?  ?  ?  ?  ?  ?  ?  ?  ?General Comments    ? ?  ?Exercises Other Exercises ?Other Exercises: Pt/fiance instructed in home/routines modifications, falls prevention, pet care considerations, compression stocking mgt, and AE/DME for ADL tasks. Handout provided to support recall and carryover. ?  ?Shoulder Instructions    ? ? ?Home Living Family/patient expects to be discharged to:: Private residence ?Living Arrangements: Spouse/significant other ?Available Help at Discharge: Available PRN/intermittently (fiance) ?Type of Home: House ?Home Access: Stairs to enter ?Entrance  Stairs-Number of Steps: 4 ?Entrance Stairs-Rails: Left ?Home Layout: One level ?  ?  ?Bathroom Shower/Tub: Walk-in shower ?  ?Bathroom Toilet: Standard ?Bathroom Accessibility: Yes ?  ?Home Equipment: Shower seat - built in;Cane - single point ?  ?  ?  ? ?  ?Prior Functioning/Environment Prior Level of Function : Independent/Modified Independent ?  ?  ?  ?  ?  ?  ?Mobility Comments: SPC for ambulation last 8 weeks ?  ?  ? ?  ?  ?OT Problem List: Pain;Decreased strength;Decreased range of motion ?  ?   ?OT Treatment/Interventions:    ?  ?OT Goals(Current goals can be found in the care plan section) Acute Rehab OT Goals ?Patient Stated Goal: go home today ?OT Goal Formulation: All assessment and education complete, DC therapy  ?OT Frequency:   ?  ? ?Co-evaluation   ?  ?  ?  ?  ? ?  ?AM-PAC OT "6 Clicks" Daily Activity     ?Outcome Measure Help from another person eating meals?: None ?Help from another person taking care of personal grooming?: None ?Help from another person toileting, which includes using toliet, bedpan, or urinal?: None ?Help from another person bathing (including washing, rinsing, drying)?: A Little ?Help from another person to put on and taking off regular upper body clothing?: None ?Help from another person to put on and taking off regular lower body clothing?: A Little ?6 Click Score: 22 ?  ?End of Session   ? ?Activity Tolerance: Patient tolerated treatment well ?Patient left: in bed;with call bell/phone within reach;with family/visitor present ? ?OT Visit Diagnosis: Other abnormalities of gait and mobility (R26.89)  ?              ?Time: 4098-1191 ?OT Time Calculation (min): 13 min ?Charges:  OT General Charges ?$OT Visit: 1 Visit ?OT Evaluation ?$OT Eval Low Complexity: 1 Low ? ?Ardeth Perfect., MPH, MS, OTR/L ?ascom [phone] ?04/29/21, 12:31 PM ? ?

## 2021-04-29 NOTE — Discharge Summary (Signed)
?Physician Discharge Summary  ?Patient ID: ?Bruce Wang ?MRN: [id number] ?DOB/AGE: 03/11/1965 56 y.o. ? ?Admit date: 04/28/2021 ?Discharge date: 04/29/2021 ? ?Admission Diagnoses:  ?Status post total hip replacement, right [Z96.641] ? ? ?Discharge Diagnoses: ?Patient Active Problem List  ? Diagnosis Date Noted  ? Status post total hip replacement, right 04/28/2021  ? Right hip pain 09/03/2020  ? History of alcohol use 09/03/2020  ? Abnormal CXR   ? Pneumonia due to COVID-19 virus 08/05/2019  ? Allergic rhinitis 11/25/2018  ? Pain of right heel 05/09/2017  ? Fatty liver 02/03/2017  ? Iron deficiency 01/26/2017  ? Rash/skin eruption 08/22/2016  ? Chronic radicular pain of lower back 07/25/2016  ? Health maintenance examination 05/30/2016  ? Vitamin B12 deficiency 05/30/2016  ? Vitamin D deficiency 05/30/2016  ? Erectile dysfunction 05/30/2016  ? Gout 06/01/2015  ? HNP (herniated nucleus pulposus), lumbar 01/23/2014  ? Obesity, morbid, BMI 40.0-49.9 ([place]) 03/16/2011  ? Insomnia 03/16/2011  ? Gastric bypass status for obesity   ? ? ?Past Medical History:  ?Diagnosis Date  ? Anemia   ? Complication of anesthesia 2005  ? WOKE UP DURING INGUINAL HERNIA SURGERY  ? Gastric bypass status for obesity 2000  ? roux en y  ? Gout   ? History of chicken pox   ? Pneumonia due to COVID-19 virus 07/2019  ? hospitalization  ? ?  ?Transfusion: none ?  ?Consultants (if any):  ? ?Discharged Condition: Improved ? ?Hospital Course: CHAUN UEMURA is an 56 y.o. male who was admitted 04/28/2021 with a diagnosis of Status post total hip replacement, right and went to the operating room on 04/28/2021 and underwent the above named procedures.  ?  ?Surgeries: Procedure(s): ?TOTAL HIP ARTHROPLASTY ANTERIOR APPROACH on 04/28/2021 ?Patient tolerated the surgery well. Taken to PACU where she was stabilized and then transferred to the orthopedic floor. ? ?Started on Lovenox 40 mg q 24 hrs. Foot pumps applied bilaterally at 80 mm. Heels elevated on bed with  rolled towels. No evidence of DVT. Negative Homan. ?Physical therapy started on day #1 for gait training and transfer. OT started day #1 for ADL and assisted devices. ? ?Patient's foley was d/c on day #1. Patient's IV  was d/c on day #1. ? ?On post op day #1 patient was stable and ready for discharge to home with HHPT. ? ? ? ?He was given perioperative antibiotics:  ?Anti-infectives (From admission, onward)  ? ? Start     Dose/Rate Route Frequency Ordered Stop  ? 04/28/21 1614  ceFAZolin (ANCEF) 2-4 GM/100ML-% IVPB       ?Note to Pharmacy: Tippett, Amy J: cabinet override  ?    04/28/21 1614 04/28/21 1839  ? 04/28/21 1600  ceFAZolin (ANCEF) IVPB 2g/100 mL premix       ? 2 g ?200 mL/hr over 30 Minutes Intravenous Every 6 hours 04/28/21 1323 04/28/21 2235  ? 04/28/21 0600  ceFAZolin (ANCEF) IVPB 3g/100 mL premix  Status:  Discontinued       ? 3 g ?200 mL/hr over 30 Minutes Intravenous On call to O.R. 04/28/21 0443 04/28/21 1321  ? ?  ?. ? ?He was given sequential compression devices, early ambulation, and Lovenox TEDs for DVT prophylaxis. ? ?He benefited maximally from the hospital stay and there were no complications.   ? ?Recent vital signs:  ?Vitals:  ? 04/29/21 0554 04/29/21 0739  ?BP: 109/75 98/80  ?Pulse: (!) 111 100  ?Resp: 14 16  ?Temp:  (!) 97.4 ?F (36.3 ?  C)  ?SpO2: 94% 100%  ? ? ?Recent laboratory studies:  ?Lab Results  ?Component Value Date  ? HGB 10.5 (L) 04/29/2021  ? HGB 12.0 (L) 04/28/2021  ? HGB 12.8 (L) 04/19/2021  ? ?Lab Results  ?Component Value Date  ? WBC 7.2 04/29/2021  ? PLT 211 04/29/2021  ? ?Lab Results  ?Component Value Date  ? INR 0.9 08/05/2019  ? ?Lab Results  ?Component Value Date  ? NA 137 04/29/2021  ? K 4.0 04/29/2021  ? CL 106 04/29/2021  ? CO2 25 04/29/2021  ? BUN 10 04/29/2021  ? CREATININE 0.87 04/29/2021  ? GLUCOSE 109 (H) 04/29/2021  ? ? ?Discharge Medications:   ?Allergies as of 04/29/2021   ?No Known Allergies ?  ? ?  ?Medication List  ?  ? ?STOP taking these medications    ? ?meloxicam 15 MG tablet ?Commonly known as: MOBIC ?  ? ?  ? ?TAKE these medications   ? ?allopurinol 300 MG tablet ?Commonly known as: ZYLOPRIM ?TAKE 1 TABLET BY MOUTH EVERY DAY ?What changed: when to take this ?  ?cetirizine 10 MG tablet ?Commonly known as: ZYRTEC ?Take 10 mg by mouth at bedtime. ?  ?docusate sodium 100 MG capsule ?Commonly known as: COLACE ?Take 1 capsule (100 mg total) by mouth 2 (two) times daily. ?  ?enoxaparin 40 MG/0.4ML injection ?Commonly known as: LOVENOX ?Inject 0.4 mLs (40 mg total) into the skin daily for 14 days. ?Start taking on: April 30, 2021 ?  ?HYDROcodone-acetaminophen 7.5-325 MG tablet ?Commonly known as: NORCO ?Take 1-2 tablets by mouth every 4 (four) hours as needed for severe pain (pain score 7-10). ?  ?methocarbamol 500 MG tablet ?Commonly known as: ROBAXIN ?Take 1 tablet (500 mg total) by mouth every 6 (six) hours as needed for muscle spasms. ?  ?PROBIOTIC GUMMIES PO ?Take 2 each by mouth at bedtime. ?  ?tadalafil 20 MG tablet ?Commonly known as: CIALIS ?TAKE 1 TABLET BY MOUTH EVERY DAY AS NEEDED FOR ERECTILE DYSFUNCTION ?  ?traMADol 50 MG tablet ?Commonly known as: ULTRAM ?Take 1 tablet (50 mg total) by mouth every 6 (six) hours as needed. ?What changed:  ?how much to take ?reasons to take this ?  ?vitamin B-12 500 MCG tablet ?Commonly known as: CYANOCOBALAMIN ?Take 500 mcg by mouth every Monday, Wednesday, and Friday. At bedtime ?  ?vitamin C 500 MG tablet ?Commonly known as: ASCORBIC ACID ?Take 500 mg by mouth at bedtime. ?  ?Vitamin D 50 MCG (2000 UT) Caps ?Take 2,000 Units by mouth at bedtime. ?  ? ?  ? ?  ?  ? ? ?  ?Durable Medical Equipment  ?(From admission, onward)  ?  ? ? ?  ? ?  Start     Ordered  ? 04/28/21 1324  DME Walker rolling  Once       ?Question Answer Comment  ?Walker: With 5 Inch Wheels   ?Patient needs a walker to treat with the following condition Status post total hip replacement, right   ?  ? 04/28/21 1323  ? 04/28/21 1324  DME 3 n 1  Once        ? 04/28/21 1323  ? 04/28/21 1324  DME Bedside commode  Once       ?Question:  Patient needs a bedside commode to treat with the following condition  Answer:  Status post total hip replacement, right  ? 04/28/21 1323  ? ?  ?  ? ?  ? ? ?Diagnostic Studies:  DG C-Arm 1-60 Min-No Report ? ?Result Date: 04/28/2021 ?Fluoroscopy was utilized by the requesting physician.  No radiographic interpretation.  ? ?DG C-Arm 1-60 Min-No Report ? ?Result Date: 04/28/2021 ?Fluoroscopy was utilized by the requesting physician.  No radiographic interpretation.  ? ?DG HIP UNILAT WITH PELVIS 1V RIGHT ? ?Result Date: 04/28/2021 ?CLINICAL DATA:  Right hip replacement EXAM: DG HIP (WITH OR WITHOUT PELVIS) 1V RIGHT COMPARISON:  09/03/2020 FINDINGS: Three fluoroscopic images are obtained during the performance of the procedure and are provided for interpretation only. Images demonstrate right total hip arthroplasty with near anatomic alignment of the prosthetic components and no perihardware fracture. Fluoroscopy time: 12 seconds 1.76 mGy IMPRESSION: Expected intraoperative appearance of a right total hip arthroplasty. Electronically Signed   By: Merilyn Baba M.D.   On: 04/28/2021 11:39  ? ?DG HIP UNILAT W OR W/O PELVIS 2-3 VIEWS RIGHT ? ?Result Date: 04/28/2021 ?CLINICAL DATA:  Right hip replacement. EXAM: DG HIP (WITH OR WITHOUT PELVIS) 2-3V RIGHT COMPARISON:  Right hip x-ray dated September 03, 2020. FINDINGS: The right hip demonstrates a total arthroplasty without evidence of hardware failure or complication. There is no fracture or dislocation. The alignment is anatomic. Post-surgical changes noted in the surrounding soft tissues. Wound VAC in place. IMPRESSION: 1. Right total hip arthroplasty without acute postoperative complication. Electronically Signed   By: Titus Dubin M.D.   On: 04/28/2021 12:43   ? ?Disposition:  ? ? ? ? Follow-up Information   ? ? Duanne Guess, PA-C Follow up in 2 week(s).   ?Specialties: Orthopedic Surgery,  Emergency Medicine ?Contact information: ?[place] [address] ?[phone] ? ? ?  ?  ? ?  ?  ? ?  ? ? ? ?Signed: ?Dorise Hiss CHRISTOPHER ?04/29/2021, 11:59 AM ? ? ?  ?

## 2021-04-29 NOTE — Progress Notes (Signed)
Physical Therapy Treatment ?Patient Details ?Name: Bruce Wang ?MRN: [id number] ?DOB: August 06, 1965 ?Today's Date: 04/29/2021 ? ? ?History of Present Illness Pt is a 56 y.o. male s/p elective R anterior approach THA due to AVN on 04/28/21. ? ?  ?PT Comments  ? ? Pt was long sitting in bed. He endorses more pain today but was cooperative and motivated. " I want to go home today." PA entered room during session. He required min assist to exit bed however once in sitting was able to stand and ambulate without physical assistance. He demonstrates safe ability to perform stairs. Supportive fiance arrived and will be assisting pt at DC. He is cleared from an acute PT standpoint for safe DC home. ?  ?Recommendations for follow up therapy are one component of a multi-disciplinary discharge planning process, led by the attending physician.  Recommendations may be updated based on patient status, additional functional criteria and insurance authorization. ? ?Follow Up Recommendations ? Home health PT ?  ?  ?Assistance Recommended at Discharge Intermittent Supervision/Assistance  ?Patient can return home with the following A little help with walking and/or transfers;Assist for transportation;Help with stairs or ramp for entrance ?  ?Equipment Recommendations ? Rolling walker (2 wheels);BSC/3in1  ?  ?   ?Precautions / Restrictions Precautions ?Precautions: Fall;Anterior Hip ?Precaution Booklet Issued: Yes (comment) ?Restrictions ?Weight Bearing Restrictions: Yes ?RLE Weight Bearing: Weight bearing as tolerated  ?  ? ?Mobility ? Bed Mobility ?Overal bed mobility: Needs Assistance ?Bed Mobility: Supine to Sit ?  ?  ?Supine to sit: Min assist ?  ?  ?  ?  ? ?Transfers ?Overall transfer level: Needs assistance ?Equipment used: Rolling walker (2 wheels) ?Transfers: Sit to/from Stand ?Sit to Stand: Supervision ?  ?  ?  ?  ?  ?  ?  ? ?Ambulation/Gait ?Ambulation/Gait assistance: Supervision ?Gait Distance (Feet): 150 Feet ?Assistive device:  Rolling walker (2 wheels) ?Gait Pattern/deviations: Step-to pattern, Antalgic ?Gait velocity: decreased ?  ?  ?General Gait Details: pt was able to ambulate ~ 150 ft with RW without LOB or safety concern ? ? ?Stairs ?Stairs: Yes ?Stairs assistance: Supervision ?Stair Management: No rails, Step to pattern, Backwards, With walker ?Number of Stairs: 4 ?General stair comments: pt was able to ascend/descend FOS with supervision ? ?  ?Balance Overall balance assessment: Needs assistance ?Sitting-balance support: Feet supported, No upper extremity supported ?Sitting balance-Leahy Scale: Fair ?  ?  ?Standing balance support: Bilateral upper extremity supported, During functional activity ?Standing balance-Leahy Scale: Fair ?  ?  ? ?  ?Cognition Arousal/Alertness: Awake/alert ?Behavior During Therapy: [facility] for tasks assessed/performed ?Overall Cognitive Status: Within Functional Limits for tasks assessed ?   ?General Comments: Pt is A and O x4 ?  ?  ? ?  ?   ?   ? ?Pertinent Vitals/Pain Pain Assessment ?Pain Assessment: 0-10 ?Pain Score: 5  ?Pain Location: R hip ?Pain Descriptors / Indicators: Aching ?Pain Intervention(s): Limited activity within patient's tolerance, Monitored during session, Premedicated before session, Repositioned  ? ? ? ?PT Goals (current goals can now be found in the care plan section) Acute Rehab PT Goals ?Patient Stated Goal: to go to fiances house ?Progress towards PT goals: Progressing toward goals ? ?  ?Frequency ? ? ? BID ? ? ? ?  ?PT Plan Current plan remains appropriate  ? ? ?   ?AM-PAC PT "6 Clicks" Mobility   ?Outcome Measure ? Help needed turning from your back to your side while in a flat bed without  using bedrails?: A Little ?Help needed moving from lying on your back to sitting on the side of a flat bed without using bedrails?: A Little ?Help needed moving to and from a bed to a chair (including a wheelchair)?: A Little ?Help needed standing up from a chair using your arms (e.g., wheelchair  or bedside chair)?: A Little ?Help needed to walk in hospital room?: A Little ?Help needed climbing 3-5 steps with a railing? : A Little ?6 Click Score: 18 ? ?  ?End of Session   ?Activity Tolerance: Patient tolerated treatment well ?Patient left: in chair;with call bell/phone within reach;with nursing/sitter in room;with family/visitor present ?Nurse Communication: Mobility status ?PT Visit Diagnosis: Difficulty in walking, not elsewhere classified (R26.2);Muscle weakness (generalized) (M62.81) ?  ? ? ?Time: 1694-5038 ?PT Time Calculation (min) (ACUTE ONLY): 31 min ? ?Charges:  $Gait Training: 8-22 mins ?$Therapeutic Activity: 8-22 mins          ?          ?Julaine Fusi PTA ?04/29/21, 10:09 AM  ? ?

## 2021-04-29 NOTE — Anesthesia Postprocedure Evaluation (Signed)
Anesthesia Post Note ? ?Patient: Bruce Wang ? ?Procedure(s) Performed: TOTAL HIP ARTHROPLASTY ANTERIOR APPROACH (Right: Hip) ? ?Patient location during evaluation: Phase II ?Anesthesia Type: Spinal ?Level of consciousness: oriented and awake and alert ?Pain management: pain level controlled ?Vital Signs Assessment: post-procedure vital signs reviewed and stable ?Respiratory status: spontaneous breathing and respiratory function stable ?Cardiovascular status: blood pressure returned to baseline and stable ?Postop Assessment: no headache, no backache, no apparent nausea or vomiting and patient able to bend at knees ?Anesthetic complications: no ? ? ?No notable events documented. ? ? ?Last Vitals:  ?Vitals:  ? 04/29/21 0554 04/29/21 0739  ?BP: 109/75 98/80  ?Pulse: (!) 111 100  ?Resp: 14 16  ?Temp:  (!) 36.3 ?C  ?SpO2: 94% 100%  ?  ?Last Pain:  ?Vitals:  ? 04/29/21 0739  ?TempSrc: Temporal  ?PainSc:   ? ? ?  ?  ?  ?  ?  ?  ? ?Lia Foyer ? ? ? ? ?

## 2021-04-29 NOTE — Progress Notes (Signed)
? ?  Subjective: ?1 Day Post-Op Procedure(s) (LRB): ?TOTAL HIP ARTHROPLASTY ANTERIOR APPROACH (Right) ?Patient reports pain as moderate.   ?Patient is well, and has had no acute complaints or problems ?Denies any CP, SOB, ABD pain. ?We will continue therapy today.  ?Plan is to go Home after hospital stay. ? ?Objective: ?Vital signs in last 24 hours: ?Temp:  [97 ?F (36.1 ?C)-98.2 ?F (36.8 ?C)] 97.4 ?F (36.3 ?C) (04/21 8891) ?Pulse Rate:  [66-111] 100 (04/21 0739) ?Resp:  [14-16] 16 (04/21 0739) ?BP: (91-117)/(58-80) 98/80 (04/21 0739) ?SpO2:  [94 %-100 %] 100 % (04/21 0739) ? ?Intake/Output from previous day: ?04/20 0701 - 04/21 0700 ?In: 2175 [P.O.:125; I.V.:2000; IV Piggyback:50] ?Out: 370 [Urine:170; Blood:200] ?Intake/Output this shift: ?No intake/output data recorded. ? ?Recent Labs  ?  04/28/21 ?1449 04/29/21 ?0552  ?HGB 12.0* 10.5*  ? ?Recent Labs  ?  04/28/21 ?1449 04/29/21 ?0552  ?WBC 9.6 7.2  ?RBC 4.62 4.01*  ?HCT 40.0 34.1*  ?PLT 226 211  ? ?Recent Labs  ?  04/28/21 ?1449 04/29/21 ?0552  ?NA  --  137  ?K  --  4.0  ?CL  --  106  ?CO2  --  25  ?BUN  --  10  ?CREATININE 1.06 0.87  ?GLUCOSE  --  109*  ?CALCIUM  --  8.3*  ? ?No results for input(s): LABPT, INR in the last 72 hours. ? ?EXAM ?General - Patient is Alert, Appropriate, and Oriented ?Extremity - Neurovascular intact ?Sensation intact distally ?Intact pulses distally ?Dorsiflexion/Plantar flexion intact ?No cellulitis present ?Compartment soft ?Dressing - dressing C/D/I and no drainage, provena intact with out drainage ?Motor Function - intact, moving foot and toes well on exam.  ? ?Past Medical History:  ?Diagnosis Date  ? Anemia   ? Complication of anesthesia 2005  ? WOKE UP DURING INGUINAL HERNIA SURGERY  ? Gastric bypass status for obesity 2000  ? roux en y  ? Gout   ? History of chicken pox   ? Pneumonia due to COVID-19 virus 07/2019  ? hospitalization  ? ? ?Assessment/Plan:   ?1 Day Post-Op Procedure(s) (LRB): ?TOTAL HIP ARTHROPLASTY ANTERIOR  APPROACH (Right) ?Principal Problem: ?  Status post total hip replacement, right ? ?Estimated body mass index is 40.48 kg/m? as calculated from the following: ?  Height as of this encounter: 6' (1.829 m). ?  Weight as of this encounter: 135.4 kg. ?Advance diet ?Up with therapy ?Labs and VSS ?Pain controlled ?CM to assist with discharge to home today ? ?DVT Prophylaxis - Lovenox, TED hose, and SCDs ?Weight-Bearing as tolerated to right leg ? ? ?T. Rachelle Hora, PA-C ?[place] ?04/29/2021, 8:14 AM ?  ?

## 2021-04-29 NOTE — Progress Notes (Signed)
Pt is resting comfortably and will give meds and vitals once patient is awake.  ?

## 2021-04-29 NOTE — Discharge Instructions (Signed)
?ANTERIOR APPROACH TOTAL HIP REPLACEMENT POSTOPERATIVE DIRECTIONS ? ? ?Hip Rehabilitation, Guidelines Following Surgery  ?The results of a hip operation are greatly improved after range of motion and muscle strengthening exercises. Follow all safety measures which are given to protect your hip. If any of these exercises cause increased pain or swelling in your joint, decrease the amount until you are comfortable again. Then slowly increase the exercises. Call your caregiver if you have problems or questions.  ? ?HOME CARE INSTRUCTIONS  ?Remove items at home which could result in a fall. This includes throw rugs or furniture in walking pathways.  ?ICE to the affected hip every three hours for 30 minutes at a time and then as needed for pain and swelling.  Continue to use ice on the hip for pain and swelling from surgery. You may notice swelling that will progress down to the foot and ankle.  This is normal after surgery.  Elevate the leg when you are not up walking on it.   ?Continue to use the breathing machine which will help keep your temperature down.  It is common for your temperature to cycle up and down following surgery, especially at night when you are not up moving around and exerting yourself.  The breathing machine keeps your lungs expanded and your temperature down. ?Do not place pillow under knee, focus on keeping the knee straight while resting ? ?DIET ?You may resume your previous home diet once your are discharged from the hospital. ? ?DRESSING / WOUND CARE / SHOWERING ?Please remove provena negative pressure dressing on 05/06/2021 and apply honey comb dressing. Keep dressing clean and dry at all times.  ? ?ACTIVITY ?Walk with your walker as instructed. ?Use walker as long as suggested by your caregivers. ?Avoid periods of inactivity such as sitting longer than an hour when not asleep. This helps prevent blood clots.  ?You may resume a sexual relationship in one month or when given the OK by your  doctor.  ?You may return to work once you are cleared by your doctor.  ?Do not drive a car for 6 weeks or until released by you surgeon.  ?Do not drive while taking narcotics. ? ?WEIGHT BEARING ?Weight bearing as tolerated. Use walker/cane as needed for at least 4 weeks post op. ? ?POSTOPERATIVE CONSTIPATION PROTOCOL ?Constipation - defined medically as fewer than three stools per week and severe constipation as less than one stool per week. ? ?One of the most common issues patients have following surgery is constipation.  Even if you have a regular bowel pattern at home, your normal regimen is likely to be disrupted due to multiple reasons following surgery.  Combination of anesthesia, postoperative narcotics, change in appetite and fluid intake all can affect your bowels.  In order to avoid complications following surgery, here are some recommendations in order to help you during your recovery period. ? ?Colace (docusate) - Pick up an over-the-counter form of Colace or another stool softener and take twice a day as long as you are requiring postoperative pain medications.  Take with a full glass of water daily.  If you experience loose stools or diarrhea, hold the colace until you stool forms back up.  If your symptoms do not get better within 1 week or if they get worse, check with your doctor. ? ?Dulcolax (bisacodyl) - Pick up over-the-counter and take as directed by the product packaging as needed to assist with the movement of your bowels.  Take with a full glass of  water.  Use this product as needed if not relieved by Colace only.  ? ?MiraLax (polyethylene glycol) - Pick up over-the-counter to have on hand.  MiraLax is a solution that will increase the amount of water in your bowels to assist with bowel movements.  Take as directed and can mix with a glass of water, juice, soda, coffee, or tea.  Take if you go more than two days without a movement. ?Do not use MiraLax more than once per day. Call your doctor  if you are still constipated or irregular after using this medication for 7 days in a row. ? ?If you continue to have problems with postoperative constipation, please contact the office for further assistance and recommendations.  If you experience "the worst abdominal pain ever" or develop nausea or vomiting, please contact the office immediatly for further recommendations for treatment. ? ?ITCHING ? If you experience itching with your medications, try taking only a single pain pill, or even half a pain pill at a time.  You can also use Benadryl over the counter for itching or also to help with sleep.  ? ?TED HOSE STOCKINGS ?Wear the elastic stockings on both legs for six weeks following surgery during the day but you may remove then at night for sleeping. ? ?MEDICATIONS ?See your medication summary on the ?After Visit Summary? that the nursing staff will review with you prior to discharge.  You may have some home medications which will be placed on hold until you complete the course of blood thinner medication.  It is important for you to complete the blood thinner medication as prescribed by your surgeon.  Continue your approved medications as instructed at time of discharge. ? ?PRECAUTIONS ?If you experience chest pain or shortness of breath - call 911 immediately for transfer to the hospital emergency department.  ?If you develop a fever greater that 101 F, purulent drainage from wound, increased redness or drainage from wound, foul odor from the wound/dressing, or calf pain - CONTACT YOUR SURGEON.   ?                                                ?FOLLOW-UP APPOINTMENTS ?Make sure you keep all of your appointments after your operation with your surgeon and caregivers. You should call the office at the above phone number and make an appointment for approximately two weeks after the date of your surgery or on the date instructed by your surgeon outlined in the "After Visit Summary". ? ?RANGE OF MOTION AND  STRENGTHENING EXERCISES  ?These exercises are designed to help you keep full movement of your hip joint. Follow your caregiver's or physical therapist's instructions. Perform all exercises about fifteen times, three times per day or as directed. Exercise both hips, even if you have had only one joint replacement. These exercises can be done on a training (exercise) mat, on the floor, on a table or on a bed. Use whatever works the best and is most comfortable for you. Use music or television while you are exercising so that the exercises are a pleasant break in your day. This will make your life better with the exercises acting as a break in routine you can look forward to.  ?Lying on your back, slowly slide your foot toward your buttocks, raising your knee up off the floor. Then slowly slide your  foot back down until your leg is straight again.  ?Lying on your back spread your legs as far apart as you can without causing discomfort.  ?Lying on your side, raise your upper leg and foot straight up from the floor as far as is comfortable. Slowly lower the leg and repeat.  ?Lying on your back, tighten up the muscle in the front of your thigh (quadriceps muscles). You can do this by keeping your leg straight and trying to raise your heel off the floor. This helps strengthen the largest muscle supporting your knee.  ?Lying on your back, tighten up the muscles of your buttocks both with the legs straight and with the knee bent at a comfortable angle while keeping your heel on the floor.  ? ?IF YOU ARE TRANSFERRED TO A SKILLED REHAB FACILITY ?If the patient is transferred to a skilled rehab facility following release from the hospital, a list of the current medications will be sent to the facility for the patient to continue.  When discharged from the skilled rehab facility, please have the facility set up the patient's Home Health Physical Therapy prior to being released. Also, the skilled facility will be responsible for  providing the patient with their medications at time of release from the facility to include their pain medication, the muscle relaxants, and their blood thinner medication. If the patient is still at the reh

## 2021-05-30 ENCOUNTER — Encounter: Payer: Self-pay | Admitting: Family Medicine

## 2021-06-21 IMAGING — DX DG CHEST 1V PORT
1 series · 1 of 1 positions shown · non-contrast
Comparison: 08/05/2019

CLINICAL DATA: Weakness and shortness of breath. Coronavirus
infection.

EXAM:
PORTABLE CHEST 1 VIEW

[chest ap]
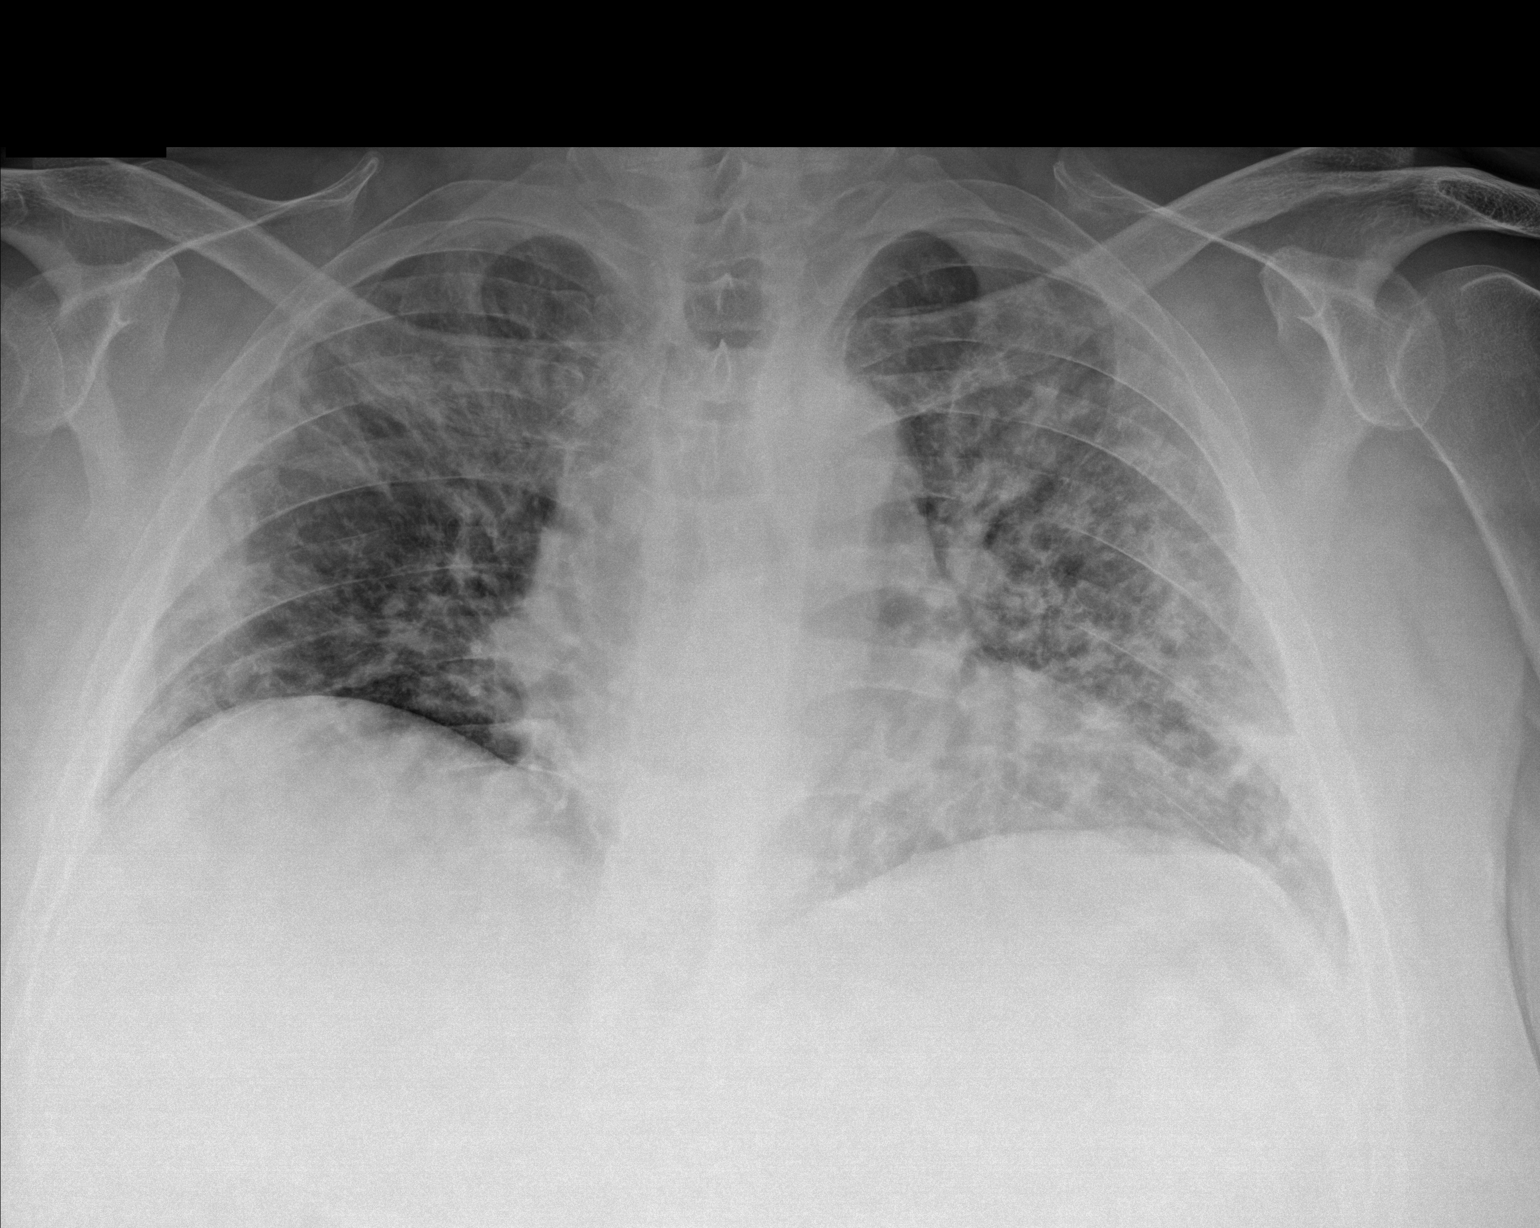

[1 of 1 positions shown; findings below may reference images not displayed]

FINDINGS: Heart size is normal. Slight worsening of widespread patchy
pulmonary infiltrates. No dense consolidation or lobar collapse. No
effusion. Bony structures unremarkable.
IMPRESSION: Slight worsening of widespread patchy pneumonia consistent with
viral pneumonia.

## 2021-07-21 ENCOUNTER — Telehealth: Payer: Self-pay | Admitting: Family Medicine

## 2021-07-21 ENCOUNTER — Other Ambulatory Visit: Payer: Self-pay | Admitting: Family Medicine

## 2021-07-21 DIAGNOSIS — J302 Other seasonal allergic rhinitis: Secondary | ICD-10-CM

## 2021-07-21 NOTE — Telephone Encounter (Signed)
Error

## 2021-07-21 NOTE — Telephone Encounter (Signed)
E-scribed 90-day refill.    Lvm asking pt to call back.  Needs CPE and lab visits for additional refills.

## 2021-09-22 ENCOUNTER — Other Ambulatory Visit: Payer: Self-pay | Admitting: Family Medicine

## 2021-09-22 ENCOUNTER — Encounter: Payer: Self-pay | Admitting: Family Medicine

## 2021-09-22 DIAGNOSIS — N529 Male erectile dysfunction, unspecified: Secondary | ICD-10-CM

## 2021-09-26 NOTE — Telephone Encounter (Signed)
Please schedule virtual visit this week for son Bruce Wang for ADHD meds. Ok to schedule one day at 4:30pm.

## 2021-10-03 ENCOUNTER — Other Ambulatory Visit: Payer: Self-pay | Admitting: Family Medicine

## 2021-10-03 DIAGNOSIS — E538 Deficiency of other specified B group vitamins: Secondary | ICD-10-CM

## 2021-10-03 DIAGNOSIS — Z125 Encounter for screening for malignant neoplasm of prostate: Secondary | ICD-10-CM

## 2021-10-03 DIAGNOSIS — M1A0720 Idiopathic chronic gout, left ankle and foot, without tophus (tophi): Secondary | ICD-10-CM

## 2021-10-03 DIAGNOSIS — E559 Vitamin D deficiency, unspecified: Secondary | ICD-10-CM

## 2021-10-03 DIAGNOSIS — Z9884 Bariatric surgery status: Secondary | ICD-10-CM

## 2021-10-03 DIAGNOSIS — E611 Iron deficiency: Secondary | ICD-10-CM

## 2021-10-05 ENCOUNTER — Other Ambulatory Visit (INDEPENDENT_AMBULATORY_CARE_PROVIDER_SITE_OTHER): Payer: Managed Care, Other (non HMO)

## 2021-10-05 DIAGNOSIS — E611 Iron deficiency: Secondary | ICD-10-CM | POA: Diagnosis not present

## 2021-10-05 DIAGNOSIS — Z125 Encounter for screening for malignant neoplasm of prostate: Secondary | ICD-10-CM | POA: Diagnosis not present

## 2021-10-05 DIAGNOSIS — M1A0720 Idiopathic chronic gout, left ankle and foot, without tophus (tophi): Secondary | ICD-10-CM

## 2021-10-05 DIAGNOSIS — E538 Deficiency of other specified B group vitamins: Secondary | ICD-10-CM

## 2021-10-05 DIAGNOSIS — E559 Vitamin D deficiency, unspecified: Secondary | ICD-10-CM | POA: Diagnosis not present

## 2021-10-05 DIAGNOSIS — Z9884 Bariatric surgery status: Secondary | ICD-10-CM | POA: Diagnosis not present

## 2021-10-05 LAB — COMPREHENSIVE METABOLIC PANEL
ALT: 22 U/L (ref 0–53)
AST: 23 U/L (ref 0–37)
Albumin: 3.9 g/dL (ref 3.5–5.2)
Alkaline Phosphatase: 82 U/L (ref 39–117)
BUN: 10 mg/dL (ref 6–23)
CO2: 29 mEq/L (ref 19–32)
Calcium: 9 mg/dL (ref 8.4–10.5)
Chloride: 105 mEq/L (ref 96–112)
Creatinine, Ser: 1.04 mg/dL (ref 0.40–1.50)
GFR: 81.05 mL/min (ref >60.00)
Glucose, Bld: 85 mg/dL (ref 70–99)
Potassium: 3.8 mEq/L (ref 3.5–5.1)
Sodium: 145 mEq/L (ref 135–145)
Total Bilirubin: 0.5 mg/dL (ref 0.2–1.2)
Total Protein: 6 g/dL (ref 6.0–8.3)

## 2021-10-05 LAB — LIPID PANEL
Cholesterol: 112 mg/dL (ref 0–200)
HDL: 53.5 mg/dL (ref >39.00)
LDL Cholesterol: 36 mg/dL (ref 0–99)
NonHDL: 58.45
Total CHOL/HDL Ratio: 2
Triglycerides: 113 mg/dL (ref 0.0–149.0)
VLDL: 22.6 mg/dL (ref 0.0–40.0)

## 2021-10-05 LAB — CBC WITH DIFFERENTIAL/PLATELET
Basophils Absolute: 0 10*3/uL (ref 0.0–0.1)
Basophils Relative: 0.5 % (ref 0.0–3.0)
Eosinophils Absolute: 0.2 10*3/uL (ref 0.0–0.7)
Eosinophils Relative: 5.1 % — ABNORMAL HIGH (ref 0.0–5.0)
HCT: 38.7 % — ABNORMAL LOW (ref 39.0–52.0)
Hemoglobin: 12.5 g/dL — ABNORMAL LOW (ref 13.0–17.0)
Lymphocytes Relative: 48.8 % — ABNORMAL HIGH (ref 12.0–46.0)
Lymphs Abs: 1.9 10*3/uL (ref 0.7–4.0)
MCHC: 32.3 g/dL (ref 30.0–36.0)
MCV: 82 fl (ref 78.0–100.0)
Monocytes Absolute: 0.3 10*3/uL (ref 0.1–1.0)
Monocytes Relative: 7.8 % (ref 3.0–12.0)
Neutro Abs: 1.5 10*3/uL (ref 1.4–7.7)
Neutrophils Relative %: 37.8 % — ABNORMAL LOW (ref 43.0–77.0)
Platelets: 245 10*3/uL (ref 150.0–400.0)
RBC: 4.71 Mil/uL (ref 4.22–5.81)
RDW: 17.5 % — ABNORMAL HIGH (ref 11.5–15.5)
WBC: 3.9 10*3/uL — ABNORMAL LOW (ref 4.0–10.5)

## 2021-10-05 LAB — IBC PANEL
Iron: 39 ug/dL — ABNORMAL LOW (ref 42–165)
Saturation Ratios: 9.2 % — ABNORMAL LOW (ref 20.0–50.0)
TIBC: 424.2 ug/dL (ref 250.0–450.0)
Transferrin: 303 mg/dL (ref 212.0–360.0)

## 2021-10-05 LAB — VITAMIN B12: Vitamin B-12: 1131 pg/mL — ABNORMAL HIGH (ref 211–911)

## 2021-10-05 LAB — FERRITIN: Ferritin: 8.5 ng/mL — ABNORMAL LOW (ref 22.0–322.0)

## 2021-10-05 LAB — TSH: TSH: 2.21 u[IU]/mL (ref 0.35–5.50)

## 2021-10-05 LAB — VITAMIN D 25 HYDROXY (VIT D DEFICIENCY, FRACTURES): VITD: 32.61 ng/mL (ref 30.00–100.00)

## 2021-10-05 LAB — FOLATE: Folate: 12.6 ng/mL (ref >5.9)

## 2021-10-05 LAB — URIC ACID: Uric Acid, Serum: 6.2 mg/dL (ref 4.0–7.8)

## 2021-10-05 LAB — PSA: PSA: 0.52 ng/mL (ref 0.10–4.00)

## 2021-10-09 LAB — VITAMIN B1: Vitamin B1 (Thiamine): 7 nmol/L — ABNORMAL LOW (ref 8–30)

## 2021-10-12 ENCOUNTER — Ambulatory Visit (INDEPENDENT_AMBULATORY_CARE_PROVIDER_SITE_OTHER): Payer: Managed Care, Other (non HMO) | Admitting: Family Medicine

## 2021-10-12 ENCOUNTER — Encounter: Payer: Self-pay | Admitting: Family Medicine

## 2021-10-12 VITALS — BP 126/82 | HR 105 | Temp 97.3°F | Ht 70.0 in | Wt 304.0 lb

## 2021-10-12 DIAGNOSIS — Z0000 Encounter for general adult medical examination without abnormal findings: Secondary | ICD-10-CM | POA: Diagnosis not present

## 2021-10-12 DIAGNOSIS — J302 Other seasonal allergic rhinitis: Secondary | ICD-10-CM | POA: Diagnosis not present

## 2021-10-12 DIAGNOSIS — Z9884 Bariatric surgery status: Secondary | ICD-10-CM | POA: Diagnosis not present

## 2021-10-12 DIAGNOSIS — E538 Deficiency of other specified B group vitamins: Secondary | ICD-10-CM

## 2021-10-12 DIAGNOSIS — E559 Vitamin D deficiency, unspecified: Secondary | ICD-10-CM

## 2021-10-12 DIAGNOSIS — E519 Thiamine deficiency, unspecified: Secondary | ICD-10-CM | POA: Diagnosis not present

## 2021-10-12 DIAGNOSIS — Z789 Other specified health status: Secondary | ICD-10-CM

## 2021-10-12 DIAGNOSIS — M1A0720 Idiopathic chronic gout, left ankle and foot, without tophus (tophi): Secondary | ICD-10-CM

## 2021-10-12 DIAGNOSIS — D509 Iron deficiency anemia, unspecified: Secondary | ICD-10-CM

## 2021-10-12 DIAGNOSIS — G4700 Insomnia, unspecified: Secondary | ICD-10-CM

## 2021-10-12 DIAGNOSIS — Z96641 Presence of right artificial hip joint: Secondary | ICD-10-CM

## 2021-10-12 MED ORDER — CYANOCOBALAMIN 500 MCG PO TABS: 500.0000 ug | ORAL_TABLET | ORAL | Status: DC

## 2021-10-12 MED ORDER — IRON (FERROUS SULFATE) 325 (65 FE) MG PO TABS: 325.0000 mg | ORAL_TABLET | ORAL | Status: DC

## 2021-10-12 MED ORDER — ALLOPURINOL 300 MG PO TABS: 300.0000 mg | ORAL_TABLET | Freq: Every day | ORAL | 4 refills | Status: DC

## 2021-10-12 MED ORDER — THIAMINE HCL 100 MG PO TABS: 100.0000 mg | ORAL_TABLET | Freq: Every day | ORAL | Status: DC

## 2021-10-12 NOTE — Assessment & Plan Note (Signed)
Reviewed alcohol use, recommend limiting this.

## 2021-10-12 NOTE — Assessment & Plan Note (Signed)
rec start thamine '100mg'$  daily.

## 2021-10-12 NOTE — Assessment & Plan Note (Signed)
Ongoing. Trazodone didn't help either.

## 2021-10-12 NOTE — Patient Instructions (Addendum)
You may call [facility] at [phone] to ask about repeat colonoscopy.  Start taking thiamin vitamin b1 '100mg'$  daily over the counter Start taking oral iron (ferrous sulfate) '325mg'$  every other day, let us know if constipation develops. Consider starting stool softener.  Good to see you today Return as needed or in 1 year for next physical. Scheduled lab visit only in 6 months.   Health Maintenance, Male Adopting a healthy lifestyle and getting preventive care are important in promoting health and wellness. Ask your health care provider about: The right schedule for you to have regular tests and exams. Things you can do on your own to prevent diseases and keep yourself healthy. What should I know about diet, weight, and exercise? Eat a healthy diet  Eat a diet that includes plenty of vegetables, fruits, low-fat dairy products, and lean protein. Do not eat a lot of foods that are high in solid fats, added sugars, or sodium. Maintain a healthy weight Body mass index (BMI) is a measurement that can be used to identify possible weight problems. It estimates body fat based on height and weight. Your health care provider can help determine your BMI and help you achieve or maintain a healthy weight. Get regular exercise Get regular exercise. This is one of the most important things you can do for your health. Most adults should: Exercise for at least 150 minutes each week. The exercise should increase your heart rate and make you sweat (moderate-intensity exercise). Do strengthening exercises at least twice a week. This is in addition to the moderate-intensity exercise. Spend less time sitting. Even light physical activity can be beneficial. Watch cholesterol and blood lipids Have your blood tested for lipids and cholesterol at 55 years of age, then have this test every 5 years. You may need to have your cholesterol levels checked more often if: Your lipid or cholesterol levels are high. You  are older than 55 years of age. You are at high risk for heart disease. What should I know about cancer screening? Many types of cancers can be detected early and may often be prevented. Depending on your health history and family history, you may need to have cancer screening at various ages. This may include screening for: Colorectal cancer. Prostate cancer. Skin cancer. Lung cancer. What should I know about heart disease, diabetes, and high blood pressure? Blood pressure and heart disease High blood pressure causes heart disease and increases the risk of stroke. This is more likely to develop in people who have high blood pressure readings or are overweight. Talk with your health care provider about your target blood pressure readings. Have your blood pressure checked: Every 3-5 years if you are 3-29 years of age. Every year if you are 20 years old or older. If you are between the ages of 71 and 72 and are a current or former smoker, ask your health care provider if you should have a one-time screening for abdominal aortic aneurysm (AAA). Diabetes Have regular diabetes screenings. This checks your fasting blood sugar level. Have the screening done: Once every three years after age 71 if you are at a normal weight and have a low risk for diabetes. More often and at a younger age if you are overweight or have a high risk for diabetes. What should I know about preventing infection? Hepatitis B If you have a higher risk for hepatitis B, you should be screened for this virus. Talk with your health care provider to find out  if you are at risk for hepatitis B infection. Hepatitis C Blood testing is recommended for: Everyone born from [birth year] through 1964. Anyone with known risk factors for hepatitis C. Sexually transmitted infections (STIs) You should be screened each year for STIs, including gonorrhea and chlamydia, if: You are sexually active and are younger than 55 years of age. You are  older than 56 years of age and your health care provider tells you that you are at risk for this type of infection. Your sexual activity has changed since you were last screened, and you are at increased risk for chlamydia or gonorrhea. Ask your health care provider if you are at risk. Ask your health care provider about whether you are at high risk for HIV. Your health care provider may recommend a prescription medicine to help prevent HIV infection. If you choose to take medicine to prevent HIV, you should first get tested for HIV. You should then be tested every 3 months for as long as you are taking the medicine. Follow these instructions at home: Alcohol use Do not drink alcohol if your health care provider tells you not to drink. If you drink alcohol: Limit how much you have to 0-2 drinks a day. Know how much alcohol is in your drink. In the [place] one drink equals one 12 oz bottle of beer (355 mL), one 5 oz glass of wine (148 mL), or one 1 oz glass of hard liquor (44 mL). Lifestyle Do not use any products that contain nicotine or tobacco. These products include cigarettes, chewing tobacco, and vaping devices, such as e-cigarettes. If you need help quitting, ask your health care provider. Do not use street drugs. Do not share needles. Ask your health care provider for help if you need support or information about quitting drugs. General instructions Schedule regular health, dental, and eye exams. Stay current with your vaccines. Tell your health care provider if: You often feel depressed. You have ever been abused or do not feel safe at home. Summary Adopting a healthy lifestyle and getting preventive care are important in promoting health and wellness. Follow your health care provider's instructions about healthy diet, exercising, and getting tested or screened for diseases. Follow your health care provider's instructions on monitoring your cholesterol and blood pressure. This  information is not intended to replace advice given to you by your health care provider. Make sure you discuss any questions you have with your health care provider. Document Revised: 05/17/2020 Document Reviewed: 05/17/2020 Elsevier Patient Education  [place].

## 2021-10-12 NOTE — Assessment & Plan Note (Signed)
Reviewed recent labs with patient.  Discussed weight gain noted.

## 2021-10-12 NOTE — Progress Notes (Signed)
Patient ID: Bruce Wang, male    DOB: 1965-02-28, 56 y.o.   MRN: [id number]  This visit was conducted in person.  BP 126/82   Pulse (!) 105   Temp (!) 97.3 F (36.3 C) (Temporal)   Ht '5\' 10"'$  (1.778 m)   Wt (!) 304 lb (137.9 kg)   SpO2 95%   BMI 43.62 kg/m    CC: CPE Subjective:   HPI: Bruce Wang is a 56 y.o. male presenting on 10/12/2021 for Annual Exam   H/o avascular necrosis of R femur - saw Dr Rudene Christians rec R hip replacement. Doing much better.  Stressful period - alone at home as both boys in college (empty nest), split from fiance. FLP normal 11/2017. No fmhx cad or stroke.   Chronic insomnia - tried melatonin, zzzquil, medication ?trazodone without benefit. Has bedtime routine.    Preventative: COLONOSCOPY WITH PROPOFOL 07/20/2016 - 1 TA, rpt 5 yrs ([place]) Prostate cancer screening - fmhx (paternal uncle). Discussed. Will check PSA.  Lung cancer screening - not eligible Flu shot - did not receive. Tdap 2013 COVID vaccine - declines, but considering due to work requirements Shingrix vaccine - discussed.  Seat belt use discussed Sunscreen use discussed. No changing moles on skin. Ex-smoker, quit ~2015, ~10+ PY hx Alcohol - stopped beer due to GI upset after bypass, now drinking 5-6 oz crown nightly  Sleep - averaging 6-7 hours/night  Dentist yearly  Eye exam yearly    Caffeine: 16-24 oz coffee/day Lives with wife, 2 children, 2 dogs Occupation: Web designer - working days  Edu: 2 yrs college Activity: walking daily Diet: poor at this time - planning to restart healthy choices once settled.     Relevant past medical, surgical, family and social history reviewed and updated as indicated. Interim medical history since our last visit reviewed. Allergies and medications reviewed and updated. Outpatient Medications Prior to Visit  Medication Sig Dispense Refill   cetirizine (ZYRTEC) 10 MG tablet Take 10 mg by mouth at bedtime.      Cholecalciferol (VITAMIN  D) 2000 units CAPS Take 2,000 Units by mouth at bedtime.     Probiotic Product (PROBIOTIC GUMMIES PO) Take 2 each by mouth at bedtime.     tadalafil (CIALIS) 20 MG tablet TAKE 1 TABLET BY MOUTH EVERY DAY AS NEEDED FOR ERECTILE DYSFUNCTION 6 tablet 1   vitamin C (ASCORBIC ACID) 500 MG tablet Take 500 mg by mouth at bedtime.     allopurinol (ZYLOPRIM) 300 MG tablet TAKE 1 TABLET BY MOUTH EVERY DAY 90 tablet 0   vitamin B-12 (CYANOCOBALAMIN) 500 MCG tablet Take 500 mcg by mouth every Monday, Wednesday, and Friday. At bedtime     docusate sodium (COLACE) 100 MG capsule Take 1 capsule (100 mg total) by mouth 2 (two) times daily. 10 capsule 0   enoxaparin (LOVENOX) 40 MG/0.4ML injection Inject 0.4 mLs (40 mg total) into the skin daily for 14 days. 5.6 mL 0   HYDROcodone-acetaminophen (NORCO) 7.5-325 MG tablet Take 1-2 tablets by mouth every 4 (four) hours as needed for severe pain (pain score 7-10). 30 tablet 0   methocarbamol (ROBAXIN) 500 MG tablet Take 1 tablet (500 mg total) by mouth every 6 (six) hours as needed for muscle spasms. 30 tablet 0   traMADol (ULTRAM) 50 MG tablet Take 1 tablet (50 mg total) by mouth every 6 (six) hours as needed. 30 tablet 0   No facility-administered medications prior to visit.  Per HPI unless specifically indicated in ROS section below Review of Systems  Constitutional:  Negative for activity change, appetite change, chills, fatigue, fever and unexpected weight change.  HENT:  Negative for hearing loss.   Eyes:  Negative for visual disturbance.  Respiratory:  Negative for cough, chest tightness, shortness of breath and wheezing.   Cardiovascular:  Negative for chest pain, palpitations and leg swelling.  Gastrointestinal:  Negative for abdominal distention, abdominal pain, blood in stool, constipation, diarrhea, nausea and vomiting.  Genitourinary:  Negative for difficulty urinating and hematuria.  Musculoskeletal:  Negative for arthralgias, myalgias and neck  pain.  Skin:  Negative for rash.  Neurological:  Negative for dizziness, seizures, syncope and headaches.  Hematological:  Negative for adenopathy. Does not bruise/bleed easily.  Psychiatric/Behavioral:  Negative for dysphoric mood. The patient is not nervous/anxious.     Objective:  BP 126/82   Pulse (!) 105   Temp (!) 97.3 F (36.3 C) (Temporal)   Ht '5\' 10"'$  (1.778 m)   Wt (!) 304 lb (137.9 kg)   SpO2 95%   BMI 43.62 kg/m   Wt Readings from Last 3 Encounters:  10/12/21 (!) 304 lb (137.9 kg)  04/28/21 298 lb 8.1 oz (135.4 kg)  04/19/21 298 lb 8 oz (135.4 kg)      Physical Exam Vitals and nursing note reviewed.  Constitutional:      General: He is not in acute distress.    Appearance: Normal appearance. He is well-developed. He is obese. He is not ill-appearing.  HENT:     Head: Normocephalic and atraumatic.     Right Ear: Hearing, tympanic membrane, ear canal and external ear normal.     Left Ear: Hearing, tympanic membrane, ear canal and external ear normal.     Mouth/Throat:     Mouth: Mucous membranes are moist.     Pharynx: Oropharynx is clear. No oropharyngeal exudate or posterior oropharyngeal erythema.  Eyes:     General: No scleral icterus.    Extraocular Movements: Extraocular movements intact.     Conjunctiva/sclera: Conjunctivae normal.     Pupils: Pupils are equal, round, and reactive to light.  Neck:     Thyroid: No thyroid mass or thyromegaly.  Cardiovascular:     Rate and Rhythm: Normal rate and regular rhythm.     Pulses: Normal pulses.          Radial pulses are 2+ on the right side and 2+ on the left side.     Heart sounds: Normal heart sounds. No murmur heard. Pulmonary:     Effort: Pulmonary effort is normal. No respiratory distress.     Breath sounds: Normal breath sounds. No wheezing, rhonchi or rales.  Abdominal:     General: Bowel sounds are normal. There is no distension.     Palpations: Abdomen is soft. There is no mass.     Tenderness:  There is no abdominal tenderness. There is no guarding or rebound.     Hernia: No hernia is present.  Musculoskeletal:        General: Normal range of motion.     Cervical back: Normal range of motion and neck supple.     Right lower leg: No edema.     Left lower leg: No edema.  Lymphadenopathy:     Cervical: No cervical adenopathy.  Skin:    General: Skin is warm and dry.     Findings: No rash.  Neurological:     General: No focal deficit  present.     Mental Status: He is alert and oriented to person, place, and time.  Psychiatric:        Mood and Affect: Mood normal.        Behavior: Behavior normal.        Thought Content: Thought content normal.        Judgment: Judgment normal.       Results for orders placed or performed in visit on 10/05/21  Uric acid  Result Value Ref Range   Uric Acid, Serum 6.2 4.0 - 7.8 mg/dL  CBC with Differential/Platelet  Result Value Ref Range   WBC 3.9 (L) 4.0 - 10.5 K/uL   RBC 4.71 4.22 - 5.81 Mil/uL   Hemoglobin 12.5 (L) 13.0 - 17.0 g/dL   HCT 38.7 (L) 39.0 - 52.0 %   MCV 82.0 78.0 - 100.0 fl   MCHC 32.3 30.0 - 36.0 g/dL   RDW 17.5 (H) 11.5 - 15.5 %   Platelets 245.0 150.0 - 400.0 K/uL   Neutrophils Relative % 37.8 (L) 43.0 - 77.0 %   Lymphocytes Relative 48.8 (H) 12.0 - 46.0 %   Monocytes Relative 7.8 3.0 - 12.0 %   Eosinophils Relative 5.1 (H) 0.0 - 5.0 %   Basophils Relative 0.5 0.0 - 3.0 %   Neutro Abs 1.5 1.4 - 7.7 K/uL   Lymphs Abs 1.9 0.7 - 4.0 K/uL   Monocytes Absolute 0.3 0.1 - 1.0 K/uL   Eosinophils Absolute 0.2 0.0 - 0.7 K/uL   Basophils Absolute 0.0 0.0 - 0.1 K/uL  PSA  Result Value Ref Range   PSA 0.52 0.10 - 4.00 ng/mL  TSH  Result Value Ref Range   TSH 2.21 0.35 - 5.50 uIU/mL  Comprehensive metabolic panel  Result Value Ref Range   Sodium 145 135 - 145 mEq/L   Potassium 3.8 3.5 - 5.1 mEq/L   Chloride 105 96 - 112 mEq/L   CO2 29 19 - 32 mEq/L   Glucose, Bld 85 70 - 99 mg/dL   BUN 10 6 - 23 mg/dL    Creatinine, Ser 1.04 0.40 - 1.50 mg/dL   Total Bilirubin 0.5 0.2 - 1.2 mg/dL   Alkaline Phosphatase 82 39 - 117 U/L   AST 23 0 - 37 U/L   ALT 22 0 - 53 U/L   Total Protein 6.0 6.0 - 8.3 g/dL   Albumin 3.9 3.5 - 5.2 g/dL   GFR 81.05 >60.00 mL/min   Calcium 9.0 8.4 - 10.5 mg/dL  Lipid panel  Result Value Ref Range   Cholesterol 112 0 - 200 mg/dL   Triglycerides 113.0 0.0 - 149.0 mg/dL   HDL 53.50 >39.00 mg/dL   VLDL 22.6 0.0 - 40.0 mg/dL   LDL Cholesterol 36 0 - 99 mg/dL   Total CHOL/HDL Ratio 2    NonHDL 58.45   Vitamin B1  Result Value Ref Range   Vitamin B1 (Thiamine) 7 (L) 8 - 30 nmol/L  VITAMIN D 25 Hydroxy (Vit-D Deficiency, Fractures)  Result Value Ref Range   VITD 32.61 30.00 - 100.00 ng/mL  Folate  Result Value Ref Range   Folate 12.6 >5.9 ng/mL  IBC panel  Result Value Ref Range   Iron 39 (L) 42 - 165 ug/dL   Transferrin 303.0 212.0 - 360.0 mg/dL   Saturation Ratios 9.2 (L) 20.0 - 50.0 %   TIBC 424.2 250.0 - 450.0 mcg/dL  Ferritin  Result Value Ref Range   Ferritin 8.5 (L) 22.0 -  322.0 ng/mL  Vitamin B12  Result Value Ref Range   Vitamin B-12 1,131 (H) 211 - 911 pg/mL    Assessment & Plan:   Problem List Items Addressed This Visit     Health maintenance examination - Primary (Chronic)    Preventative protocols reviewed and updated unless pt declined. Discussed healthy diet and lifestyle.       Gastric bypass status for obesity    Reviewed recent labs with patient.  Discussed weight gain noted.       Obesity, morbid, BMI 40.0-49.9 ([place])    Encouraged healthy diet and lifestyle choices to affect sustainable weight loss.       Insomnia    Ongoing. Trazodone didn't help either.       Gout    Chronic, stable period on daily allopurinol - continue. No recent gout flare.       Relevant Medications   allopurinol (ZYLOPRIM) 300 MG tablet   Vitamin B12 deficiency    Levels elevated - rec drop to once weekly dosing.       Vitamin D deficiency     Levels stable on 2000 IU daily.       Iron deficiency anemia    Likely related to post-gastric bypass - rec start oral iron MWF.       Relevant Medications   Iron, Ferrous Sulfate, 325 (65 Fe) MG TABS   cyanocobalamin (VITAMIN B12) 500 MCG tablet   Alcohol use    Reviewed alcohol use, recommend limiting this.      Status post total hip replacement, right   Vitamin B1 deficiency    rec start thamine '100mg'$  daily.       Other Visit Diagnoses     Acute seasonal allergic rhinitis            Meds ordered this encounter  Medications   allopurinol (ZYLOPRIM) 300 MG tablet    Sig: Take 1 tablet (300 mg total) by mouth daily.    Dispense:  90 tablet    Refill:  4   thiamine (VITAMIN B1) 100 MG tablet    Sig: Take 1 tablet (100 mg total) by mouth daily.   Iron, Ferrous Sulfate, 325 (65 Fe) MG TABS    Sig: Take 325 mg by mouth every other day.   cyanocobalamin (VITAMIN B12) 500 MCG tablet    Sig: Take 1 tablet (500 mcg total) by mouth once a week. At bedtime   No orders of the defined types were placed in this encounter.   Patient instructions: You may call [facility] at [phone] to ask about repeat colonoscopy.  Start taking thiamin vitamin b1 '100mg'$  daily over the counter Start taking oral iron (ferrous sulfate) '325mg'$  every other day, let us know if constipation develops. Consider starting stool softener.  Good to see you today Return as needed or in 1 year for next physical. Scheduled lab visit only in 6 months.   Follow up plan: Return in about 1 year (around 10/13/2022) for annual exam, prior fasting for blood work.  Bruce Bush, MD

## 2021-10-12 NOTE — Assessment & Plan Note (Signed)
Preventative protocols reviewed and updated unless pt declined. Discussed healthy diet and lifestyle.  

## 2021-10-12 NOTE — Assessment & Plan Note (Signed)
Levels stable on 2000 IU daily.

## 2021-10-12 NOTE — Assessment & Plan Note (Signed)
Encouraged healthy diet and lifestyle choices to affect sustainable weight loss.  ?

## 2021-10-12 NOTE — Assessment & Plan Note (Signed)
Levels elevated - rec drop to once weekly dosing.

## 2021-10-12 NOTE — Assessment & Plan Note (Signed)
Likely related to post-gastric bypass - rec start oral iron MWF.

## 2021-10-12 NOTE — Assessment & Plan Note (Signed)
Chronic, stable period on daily allopurinol - continue. No recent gout flare.

## 2022-02-14 ENCOUNTER — Encounter: Payer: Self-pay | Admitting: Family Medicine

## 2022-03-21 ENCOUNTER — Telehealth: Payer: Self-pay

## 2022-03-21 NOTE — Telephone Encounter (Signed)
Received faxed surgical clearance form from [facility]. Pt is scheduled for L THA on 04/20/2022.  Spoke with pt scheduling pre-op eval on 03/29/22 at 8:30.  [Form is in basket on [employer].]

## 2022-03-27 ENCOUNTER — Other Ambulatory Visit: Payer: Self-pay | Admitting: Orthopedic Surgery

## 2022-03-29 ENCOUNTER — Ambulatory Visit: Payer: Managed Care, Other (non HMO) | Admitting: Family Medicine

## 2022-03-29 ENCOUNTER — Encounter: Payer: Self-pay | Admitting: Family Medicine

## 2022-03-29 ENCOUNTER — Ambulatory Visit (INDEPENDENT_AMBULATORY_CARE_PROVIDER_SITE_OTHER)
Admission: RE | Admit: 2022-03-29 | Discharge: 2022-03-29 | Disposition: A | Discharge status: Home / Self Care | Payer: Managed Care, Other (non HMO) | Source: Ambulatory Visit | Attending: Family Medicine | Admitting: Family Medicine

## 2022-03-29 VITALS — BP 126/82 | HR 80 | Temp 97.2°F | Ht 70.0 in | Wt 267.2 lb

## 2022-03-29 DIAGNOSIS — Z96641 Presence of right artificial hip joint: Secondary | ICD-10-CM

## 2022-03-29 DIAGNOSIS — Z01818 Encounter for other preprocedural examination: Secondary | ICD-10-CM | POA: Insufficient documentation

## 2022-03-29 DIAGNOSIS — M87059 Idiopathic aseptic necrosis of unspecified femur: Secondary | ICD-10-CM | POA: Diagnosis not present

## 2022-03-29 DIAGNOSIS — E538 Deficiency of other specified B group vitamins: Secondary | ICD-10-CM

## 2022-03-29 DIAGNOSIS — E559 Vitamin D deficiency, unspecified: Secondary | ICD-10-CM | POA: Diagnosis not present

## 2022-03-29 DIAGNOSIS — Z9884 Bariatric surgery status: Secondary | ICD-10-CM | POA: Diagnosis not present

## 2022-03-29 DIAGNOSIS — Z789 Other specified health status: Secondary | ICD-10-CM

## 2022-03-29 DIAGNOSIS — E519 Thiamine deficiency, unspecified: Secondary | ICD-10-CM

## 2022-03-29 DIAGNOSIS — M1A0720 Idiopathic chronic gout, left ankle and foot, without tophus (tophi): Secondary | ICD-10-CM

## 2022-03-29 DIAGNOSIS — D509 Iron deficiency anemia, unspecified: Secondary | ICD-10-CM

## 2022-03-29 DIAGNOSIS — F1090 Alcohol use, unspecified, uncomplicated: Secondary | ICD-10-CM

## 2022-03-29 LAB — COMPREHENSIVE METABOLIC PANEL
ALT: 12 U/L (ref 0–53)
AST: 14 U/L (ref 0–37)
Albumin: 4.1 g/dL (ref 3.5–5.2)
Alkaline Phosphatase: 62 U/L (ref 39–117)
BUN: 13 mg/dL (ref 6–23)
CO2: 28 mEq/L (ref 19–32)
Calcium: 9.4 mg/dL (ref 8.4–10.5)
Chloride: 103 mEq/L (ref 96–112)
Creatinine, Ser: 0.92 mg/dL (ref 0.40–1.50)
GFR: 93.58 mL/min (ref >60.00)
Glucose, Bld: 88 mg/dL (ref 70–99)
Potassium: 4.2 mEq/L (ref 3.5–5.1)
Sodium: 140 mEq/L (ref 135–145)
Total Bilirubin: 0.5 mg/dL (ref 0.2–1.2)
Total Protein: 6.3 g/dL (ref 6.0–8.3)

## 2022-03-29 LAB — CBC WITH DIFFERENTIAL/PLATELET
Basophils Absolute: 0 10*3/uL (ref 0.0–0.1)
Basophils Relative: 0.6 % (ref 0.0–3.0)
Eosinophils Absolute: 0.1 10*3/uL (ref 0.0–0.7)
Eosinophils Relative: 3.6 % (ref 0.0–5.0)
HCT: 40.5 % (ref 39.0–52.0)
Hemoglobin: 13.1 g/dL (ref 13.0–17.0)
Lymphocytes Relative: 39.6 % (ref 12.0–46.0)
Lymphs Abs: 1.3 10*3/uL (ref 0.7–4.0)
MCHC: 32.4 g/dL (ref 30.0–36.0)
MCV: 85.3 fl (ref 78.0–100.0)
Monocytes Absolute: 0.2 10*3/uL (ref 0.1–1.0)
Monocytes Relative: 7.4 % (ref 3.0–12.0)
Neutro Abs: 1.7 10*3/uL (ref 1.4–7.7)
Neutrophils Relative %: 48.8 % (ref 43.0–77.0)
Platelets: 260 10*3/uL (ref 150.0–400.0)
RBC: 4.75 Mil/uL (ref 4.22–5.81)
RDW: 14.7 % (ref 11.5–15.5)
WBC: 3.4 10*3/uL — ABNORMAL LOW (ref 4.0–10.5)

## 2022-03-29 LAB — IBC PANEL
Iron: 47 ug/dL (ref 42–165)
Saturation Ratios: 11.3 % — ABNORMAL LOW (ref 20.0–50.0)
TIBC: 414.4 ug/dL (ref 250.0–450.0)
Transferrin: 296 mg/dL (ref 212.0–360.0)

## 2022-03-29 LAB — VITAMIN D 25 HYDROXY (VIT D DEFICIENCY, FRACTURES): VITD: 30.02 ng/mL (ref 30.00–100.00)

## 2022-03-29 LAB — FERRITIN: Ferritin: 12.6 ng/mL — ABNORMAL LOW (ref 22.0–322.0)

## 2022-03-29 LAB — URIC ACID: Uric Acid, Serum: 8.7 mg/dL — ABNORMAL HIGH (ref 4.0–7.8)

## 2022-03-29 LAB — VITAMIN B12: Vitamin B-12: 250 pg/mL (ref 211–911)

## 2022-03-29 NOTE — Patient Instructions (Addendum)
Labs today  Xray today  Pending lab results we will discuss restarting allopurinol and vitamins.  We will forward results to Dr Karel Jarvis at [facility]

## 2022-03-29 NOTE — Progress Notes (Unsigned)
Patient ID: Bruce Wang, male    DOB: March 11, 1965, 57 y.o.   MRN: [id number]  This visit was conducted in person.  BP 126/82   Pulse 80   Temp (!) 97.2 F (36.2 C) (Temporal)   Ht 5\' 10"  (1.778 m)   Wt 267 lb 4 oz (121.2 kg)   SpO2 95%   BMI 38.35 kg/m    CC: preop evaluation Subjective:   HPI: Bruce Wang is a 57 y.o. male presenting on 03/29/2022 for Pre-op Exam (Needs L THA. Surgery date- 04/20/22.)   [name]  has a past medical history of Anemia, Complication of anesthesia (2005), Gastric bypass status for obesity (2000), Gout, History of chicken pox, and Pneumonia due to COVID-19 virus (07/2019).  Planned upcoming L total hip replacement by Dr Karel Jarvis at [facility] scheduled on 04/20/2022.  H/o avascular necrosis of bilateral femurs s/p R hip replacement 04/2021 by Dr Rudene Christians. Now planned left intervention.   Patient has tolerated anesthesia well in the past however he did wake up during inguinal hernia repair 2005.  Latest surgical intervention was right hip replacement 04/2021 by Dr Rudene Christians.  Denies trouble with post-op nausea/vomiting, or trouble awakening after surgery.   Denies chest pain, dyspnea, palpitations, leg swelling, HA, dizziness.  No fevers/chills, coughing, abd pain, diarrhea, or UTI symptoms.   I see pt has pre-op hospital appt scheduled with planned EKG, UA, and labs scheduled in a few weeks so I will not check further labs today.   Currently off all meds and supplements including allopurinol. No recent gout flares.  Lab Results  Component Value Date   LABURIC 6.2 10/05/2021    Currently not drinking alcohol - over the past 3 months.      Relevant past medical, surgical, family and social history reviewed and updated as indicated. Interim medical history since our last visit reviewed. Allergies and medications reviewed and updated. Outpatient Medications Prior to Visit  Medication Sig Dispense Refill   Iron, Ferrous Sulfate, 325 (65 Fe) MG TABS  Take 325 mg by mouth every other day. (Patient not taking: Reported on 03/29/2022)     allopurinol (ZYLOPRIM) 300 MG tablet Take 1 tablet (300 mg total) by mouth daily. (Patient not taking: Reported on 03/29/2022) 90 tablet 4   cetirizine (ZYRTEC) 10 MG tablet Take 10 mg by mouth at bedtime.  (Patient not taking: Reported on 03/29/2022)     Cholecalciferol (VITAMIN D) 2000 units CAPS Take 2,000 Units by mouth at bedtime. (Patient not taking: Reported on 03/29/2022)     cyanocobalamin (VITAMIN B12) 500 MCG tablet Take 1 tablet (500 mcg total) by mouth once a week. At bedtime (Patient not taking: Reported on 03/29/2022)     Probiotic Product (PROBIOTIC GUMMIES PO) Take 2 each by mouth at bedtime.     tadalafil (CIALIS) 20 MG tablet TAKE 1 TABLET BY MOUTH EVERY DAY AS NEEDED FOR ERECTILE DYSFUNCTION (Patient not taking: Reported on 03/29/2022) 6 tablet 1   thiamine (VITAMIN B1) 100 MG tablet Take 1 tablet (100 mg total) by mouth daily. (Patient not taking: Reported on 03/29/2022)     vitamin C (ASCORBIC ACID) 500 MG tablet Take 500 mg by mouth at bedtime. (Patient not taking: Reported on 03/29/2022)     No facility-administered medications prior to visit.     Per HPI unless specifically indicated in ROS section below Review of Systems  Objective:  BP 126/82   Pulse 80   Temp (!) 97.2 F (  36.2 C) (Temporal)   Ht 5\' 10"  (1.778 m)   Wt 267 lb 4 oz (121.2 kg)   SpO2 95%   BMI 38.35 kg/m   Wt Readings from Last 3 Encounters:  03/29/22 267 lb 4 oz (121.2 kg)  10/12/21 (!) 304 lb (137.9 kg)  04/28/21 298 lb 8.1 oz (135.4 kg)      Physical Exam Vitals and nursing note reviewed.  Constitutional:      Appearance: Normal appearance. He is not ill-appearing.     Comments: Walks with cane, antalgic gait due to L hip pain   HENT:     Mouth/Throat:     Mouth: Mucous membranes are moist.     Pharynx: Oropharynx is clear. No oropharyngeal exudate or posterior oropharyngeal erythema.  Eyes:      Extraocular Movements: Extraocular movements intact.     Pupils: Pupils are equal, round, and reactive to light.  Cardiovascular:     Rate and Rhythm: Normal rate and regular rhythm.     Pulses: Normal pulses.     Heart sounds: Normal heart sounds. No murmur heard. Pulmonary:     Effort: Pulmonary effort is normal. No respiratory distress.     Breath sounds: Normal breath sounds. No wheezing, rhonchi or rales.  Musculoskeletal:     Right lower leg: No edema.     Left lower leg: No edema.  Skin:    General: Skin is warm and dry.     Findings: No rash.  Neurological:     Mental Status: He is alert.  Psychiatric:        Mood and Affect: Mood normal.        Behavior: Behavior normal.       Results for orders placed or performed in visit on 10/05/21  Uric acid  Result Value Ref Range   Uric Acid, Serum 6.2 4.0 - 7.8 mg/dL  CBC with Differential/Platelet  Result Value Ref Range   WBC 3.9 (L) 4.0 - 10.5 K/uL   RBC 4.71 4.22 - 5.81 Mil/uL   Hemoglobin 12.5 (L) 13.0 - 17.0 g/dL   HCT 38.7 (L) 39.0 - 52.0 %   MCV 82.0 78.0 - 100.0 fl   MCHC 32.3 30.0 - 36.0 g/dL   RDW 17.5 (H) 11.5 - 15.5 %   Platelets 245.0 150.0 - 400.0 K/uL   Neutrophils Relative % 37.8 (L) 43.0 - 77.0 %   Lymphocytes Relative 48.8 (H) 12.0 - 46.0 %   Monocytes Relative 7.8 3.0 - 12.0 %   Eosinophils Relative 5.1 (H) 0.0 - 5.0 %   Basophils Relative 0.5 0.0 - 3.0 %   Neutro Abs 1.5 1.4 - 7.7 K/uL   Lymphs Abs 1.9 0.7 - 4.0 K/uL   Monocytes Absolute 0.3 0.1 - 1.0 K/uL   Eosinophils Absolute 0.2 0.0 - 0.7 K/uL   Basophils Absolute 0.0 0.0 - 0.1 K/uL  PSA  Result Value Ref Range   PSA 0.52 0.10 - 4.00 ng/mL  TSH  Result Value Ref Range   TSH 2.21 0.35 - 5.50 uIU/mL  Comprehensive metabolic panel  Result Value Ref Range   Sodium 145 135 - 145 mEq/L   Potassium 3.8 3.5 - 5.1 mEq/L   Chloride 105 96 - 112 mEq/L   CO2 29 19 - 32 mEq/L   Glucose, Bld 85 70 - 99 mg/dL   BUN 10 6 - 23 mg/dL   Creatinine,  Ser 1.04 0.40 - 1.50 mg/dL   Total Bilirubin 0.5 0.2 -  1.2 mg/dL   Alkaline Phosphatase 82 39 - 117 U/L   AST 23 0 - 37 U/L   ALT 22 0 - 53 U/L   Total Protein 6.0 6.0 - 8.3 g/dL   Albumin 3.9 3.5 - 5.2 g/dL   GFR 81.05 >60.00 mL/min   Calcium 9.0 8.4 - 10.5 mg/dL  Lipid panel  Result Value Ref Range   Cholesterol 112 0 - 200 mg/dL   Triglycerides 113.0 0.0 - 149.0 mg/dL   HDL 53.50 >39.00 mg/dL   VLDL 22.6 0.0 - 40.0 mg/dL   LDL Cholesterol 36 0 - 99 mg/dL   Total CHOL/HDL Ratio 2    NonHDL 58.45   Vitamin B1  Result Value Ref Range   Vitamin B1 (Thiamine) 7 (L) 8 - 30 nmol/L  VITAMIN D 25 Hydroxy (Vit-D Deficiency, Fractures)  Result Value Ref Range   VITD 32.61 30.00 - 100.00 ng/mL  Folate  Result Value Ref Range   Folate 12.6 >5.9 ng/mL  IBC panel  Result Value Ref Range   Iron 39 (L) 42 - 165 ug/dL   Transferrin 303.0 212.0 - 360.0 mg/dL   Saturation Ratios 9.2 (L) 20.0 - 50.0 %   TIBC 424.2 250.0 - 450.0 mcg/dL  Ferritin  Result Value Ref Range   Ferritin 8.5 (L) 22.0 - 322.0 ng/mL  Vitamin B12  Result Value Ref Range   Vitamin B-12 1,131 (H) 211 - 911 pg/mL    Assessment & Plan:   Problem List Items Addressed This Visit     Gastric bypass status for obesity   Severe obesity (BMI 35.0-39.9) with comorbidity ([place])    Discussed weight loss noted - he's been following revised atkin's diet since December 2023, feels this is sustainable.       Gout   Vitamin B12 deficiency   Vitamin D deficiency   Relevant Orders   Vitamin D, 25-hydroxy   Iron deficiency anemia   Alcohol use    No alcohol use in the past 3 months.       Vitamin B1 deficiency   Pre-op evaluation - Primary   Relevant Orders   Comprehensive metabolic panel   CBC with Differential/Platelet   Vitamin B12   Ferritin   IBC panel   Vitamin B1   Uric acid   DG Chest 2 View     No orders of the defined types were placed in this encounter.   Orders Placed This Encounter  Procedures    DG Chest 2 View    Standing Status:   Future    Standing Expiration Date:   03/29/2023    Order Specific Question:   Reason for Exam (SYMPTOM  OR DIAGNOSIS REQUIRED)    Answer:   preop eval    Order Specific Question:   Preferred imaging location?    Answer:   [facility]   Comprehensive metabolic panel   CBC with Differential/Platelet   Vitamin B12   Ferritin   IBC panel   Vitamin B1   Uric acid   Vitamin D, 25-hydroxy    Patient Instructions  Labs today  Xray today  Pending lab results we will discuss restarting allopurinol and vitamins.  We will forward results to Dr Karel Jarvis at [facility]  Follow up plan: Return if symptoms worsen or fail to improve.  [name], MD

## 2022-03-29 NOTE — Assessment & Plan Note (Signed)
No alcohol use in the past 3 months.

## 2022-03-29 NOTE — Assessment & Plan Note (Signed)
Discussed weight loss noted - he's been following revised atkin's diet since December 2023, feels this is sustainable.

## 2022-03-30 ENCOUNTER — Encounter: Payer: Self-pay | Admitting: Family Medicine

## 2022-03-30 DIAGNOSIS — M87059 Idiopathic aseptic necrosis of unspecified femur: Secondary | ICD-10-CM | POA: Insufficient documentation

## 2022-03-30 MED ORDER — VITAMIN C 500 MG PO TABS: 500.0000 mg | ORAL_TABLET | ORAL | Status: AC

## 2022-03-30 MED ORDER — VITAMIN C 500 MG PO TABS: 500.0000 mg | ORAL_TABLET | ORAL | Status: DC

## 2022-03-30 MED ORDER — VITAMIN D 50 MCG (2000 UT) PO CAPS: 2000.0000 [IU] | ORAL_CAPSULE | Freq: Every day | ORAL | Status: AC

## 2022-03-30 MED ORDER — CYANOCOBALAMIN 500 MCG PO TABS: 500.0000 ug | ORAL_TABLET | ORAL | Status: DC

## 2022-03-30 MED ORDER — ALLOPURINOL 100 MG PO TABS: 100.0000 mg | ORAL_TABLET | Freq: Every day | ORAL | 2 refills | Status: DC

## 2022-03-30 NOTE — Assessment & Plan Note (Signed)
Update urate off allopurinol.

## 2022-03-30 NOTE — Assessment & Plan Note (Addendum)
RCRI = 0 H/o waking up during inguinal hernia repair 2005. Did better with latest R hip replacement.  EKG not done as he has upcoming surgical preop already scheduled. Check CXR and labs today.  Anticipate adequately low risk to proceed with planned surgical intervention.

## 2022-03-30 NOTE — Assessment & Plan Note (Signed)
Update levels off replacement. 

## 2022-03-30 NOTE — Assessment & Plan Note (Signed)
Update labs prior to surgery.

## 2022-03-30 NOTE — Assessment & Plan Note (Signed)
Bilateral s/p R hip replacement 2023, planned L hip replacement next month.

## 2022-03-30 NOTE — Assessment & Plan Note (Signed)
Update levels on daily oral iron replacement.

## 2022-03-31 NOTE — Telephone Encounter (Signed)
Faxed form to [facility] at [phone], attn: Prentiss Bells.

## 2022-04-02 LAB — VITAMIN B1: Vitamin B1 (Thiamine): <6 nmol/L — ABNORMAL LOW (ref 8–30)

## 2022-04-03 ENCOUNTER — Other Ambulatory Visit: Payer: Self-pay | Admitting: Family Medicine

## 2022-04-03 MED ORDER — THIAMINE HCL 100 MG PO TABS: 100.0000 mg | ORAL_TABLET | Freq: Every day | ORAL | Status: DC

## 2022-04-10 ENCOUNTER — Other Ambulatory Visit: Payer: Self-pay

## 2022-04-10 ENCOUNTER — Encounter
Admission: RE | Admit: 2022-04-10 | Discharge: 2022-04-10 | Disposition: A | Discharge status: Home / Self Care | Payer: Managed Care, Other (non HMO) | Source: Ambulatory Visit | Attending: Orthopedic Surgery | Admitting: Orthopedic Surgery

## 2022-04-10 DIAGNOSIS — Z01818 Encounter for other preprocedural examination: Secondary | ICD-10-CM | POA: Diagnosis not present

## 2022-04-10 HISTORY — DX: Fatty (change of) liver, not elsewhere classified: K76.0

## 2022-04-10 LAB — TYPE AND SCREEN
ABO/RH(D): O POS
Antibody Screen: NEGATIVE

## 2022-04-10 LAB — URINALYSIS, ROUTINE W REFLEX MICROSCOPIC
Bilirubin Urine: NEGATIVE
Glucose, UA: NEGATIVE mg/dL
Hgb urine dipstick: NEGATIVE
Ketones, ur: NEGATIVE mg/dL
Leukocytes,Ua: NEGATIVE
Nitrite: NEGATIVE
Protein, ur: NEGATIVE mg/dL
Specific Gravity, Urine: 1.024 (ref 1.005–1.030)
pH: 5 (ref 5.0–8.0)

## 2022-04-10 LAB — SURGICAL PCR SCREEN
MRSA, PCR: NEGATIVE
Staphylococcus aureus: POSITIVE — AB

## 2022-04-10 NOTE — Patient Instructions (Addendum)
Your procedure is scheduled on: Thursday 04/20/22 To find out your arrival time, please call [phone] between 1PM - 3PM on:   Wednesday 04/19/22 Report to the Registration Desk on the 1st floor of the [facility]. Valet parking is available.  If your arrival time is 6:00 am, do not arrive before that time as the [facility] entrance doors do not open until 6:00 am.  REMEMBER: Instructions that are not followed completely may result in serious medical risk, up to and including death; or upon the discretion of your surgeon and anesthesiologist your surgery may need to be rescheduled.  Do not eat food after midnight the night before surgery.  No gum chewing or hard candies.  You may however, drink CLEAR liquids up to 2 hours before you are scheduled to arrive for your surgery. Do not drink anything within 2 hours of your scheduled arrival time.  Clear liquids include: - water  - apple juice without pulp - gatorade (not RED colors) - black coffee or tea (Do NOT add milk or creamers to the coffee or tea) Do NOT drink anything that is not on this list.  Type 1 and Type 2 diabetics should only drink water.  In addition, your doctor has ordered for you to drink the provided:  Ensure Pre-Surgery Clear Carbohydrate Drink  Drinking this carbohydrate drink up to two hours before surgery helps to reduce insulin resistance and improve patient outcomes. Please complete drinking 2 hours before scheduled arrival time.  One week prior to surgery: Stop Anti-inflammatories (NSAIDS) such as Advil, Aleve, Ibuprofen, Motrin, Naproxen, Naprosyn and Aspirin based products such as Excedrin, Goody's Powder, BC Powder. You may however, continue to take Tylenol if needed for pain up until the day of surgery.  Stop ANY OVER THE COUNTER supplements for 1 week until after surgery. Continue the iron.  Continue taking all prescribed medications with the exception of the following: Hold Cialis for at least 2  days prior to your surgery.  TAKE ONLY THESE MEDICATIONS THE MORNING OF SURGERY WITH A SIP OF WATER:  allopurinol (ZYLOPRIM) 100 MG tablet   No Alcohol for 24 hours before or after surgery.  No Smoking including e-cigarettes for 24 hours before surgery.  No chewable tobacco products for at least 6 hours before surgery.  No nicotine patches on the day of surgery.  Do not use any "recreational" drugs for at least a week (preferably 2 weeks) before your surgery.  Please be advised that the combination of cocaine and anesthesia may have negative outcomes, up to and including death. If you test positive for cocaine, your surgery will be cancelled.  On the morning of surgery brush your teeth with toothpaste and water, you may rinse your mouth with mouthwash if you wish. Do not swallow any toothpaste or mouthwash.  Use CHG Soap or wipes as directed on instruction sheet.  Do not wear lotions, powders, or perfumes the day of surgery.  Do not shave body hair from the neck down 48 hours before surgery.  Wear comfortable clothing (specific to your surgery type) to the hospital.  Do not wear jewelry, make-up, hairpins, clips or nail polish.  Contact lenses, hearing aids and dentures may not be worn into surgery.  Do not bring valuables to the hospital. [facility] is not responsible for any missing/lost belongings or valuables.   Notify your doctor if there is any change in your medical condition (cold, fever, infection).  If you are being discharged the day of  surgery, you will not be allowed to drive home. You will need a responsible individual to drive you home and stay with you for 24 hours after surgery.   If you are taking public transportation, you will need to have a responsible individual with you.  If you are being admitted to the hospital overnight, leave your suitcase in the car. After surgery it may be brought to your room.  In case of increased patient census, it may be  necessary for you, the patient, to continue your postoperative care in the Same Day Surgery department.  After surgery, you can help prevent lung complications by doing breathing exercises.  Take deep breaths and cough every 1-2 hours. Your doctor may order a device called an Incentive Spirometer to help you take deep breaths. When coughing or sneezing, hold a pillow firmly against your incision with both hands. This is called "splinting." Doing this helps protect your incision. It also decreases belly discomfort.  Surgery Visitation Policy:  Patients undergoing a surgery or procedure may have two family members or support persons with them as long as the person is not COVID-19 positive or experiencing its symptoms.   Inpatient Visitation:    Visiting hours are 7 a.m. to 8 p.m. Up to four visitors are allowed at one time in a patient room. The visitors may rotate out with other people during the day. One designated support person (adult) may remain overnight. Please call the [facility] at [phone] if you have any questions about these instructions.  How to Use an Incentive Spirometer  An incentive spirometer is a tool that measures how well you are filling your lungs with each breath. Learning to take long, deep breaths using this tool can help you keep your lungs clear and active. This may help to reverse or lessen your chance of developing breathing (pulmonary) problems, especially infection. You may be asked to use a spirometer: After a surgery. If you have a lung problem or a history of smoking. After a long period of time when you have been unable to move or be active. If the spirometer includes an indicator to show the highest number that you have reached, your health care provider or respiratory therapist will help you set a goal. Keep a log of your progress as told by your health care provider. What are the risks? Breathing too quickly may cause dizziness or  cause you to pass out. Take your time so you do not get dizzy or light-headed. If you are in pain, you may need to take pain medicine before doing incentive spirometry. It is harder to take a deep breath if you are having pain. How to use your incentive spirometer  Sit up on the edge of your bed or on a chair. Hold the incentive spirometer so that it is in an upright position. Before you use the spirometer, breathe out normally. Place the mouthpiece in your mouth. Make sure your lips are closed tightly around it. Breathe in slowly and as deeply as you can through your mouth, causing the piston or the ball to rise toward the top of the chamber. Hold your breath for 3-5 seconds, or for as long as possible. If the spirometer includes a coach indicator, use this to guide you in breathing. Slow down your breathing if the indicator goes above the marked areas. Remove the mouthpiece from your mouth and breathe out normally. The piston or ball will return to the bottom of the chamber. Rest  for a few seconds, then repeat the steps 10 or more times. Take your time and take a few normal breaths between deep breaths so that you do not get dizzy or light-headed. Do this every 1-2 hours when you are awake. If the spirometer includes a goal marker to show the highest number you have reached (best effort), use this as a goal to work toward during each repetition. After each set of 10 deep breaths, cough a few times. This will help to make sure that your lungs are clear. If you have an incision on your chest or abdomen from surgery, place a pillow or a rolled-up towel firmly against the incision when you cough. This can help to reduce pain while taking deep breaths and coughing. General tips When you are able to get out of bed: Walk around often. Continue to take deep breaths and cough in order to clear your lungs. Keep using the incentive spirometer until your health care provider says it is okay to stop using  it. If you have been in the hospital, you may be told to keep using the spirometer at home. Contact a health care provider if: You are having difficulty using the spirometer. You have trouble using the spirometer as often as instructed. Your pain medicine is not giving enough relief for you to use the spirometer as told. You have a fever. Get help right away if: You develop shortness of breath. You develop a cough with bloody mucus from the lungs. You have fluid or blood coming from an incision site after you cough. Summary An incentive spirometer is a tool that can help you learn to take long, deep breaths to keep your lungs clear and active. You may be asked to use a spirometer after a surgery, if you have a lung problem or a history of smoking, or if you have been inactive for a long period of time. Use your incentive spirometer as instructed every 1-2 hours while you are awake. If you have an incision on your chest or abdomen, place a pillow or a rolled-up towel firmly against your incision when you cough. This will help to reduce pain. Get help right away if you have shortness of breath, you cough up bloody mucus, or blood comes from your incision when you cough. This information is not intended to replace advice given to you by your health care provider. Make sure you discuss any questions you have with your health care provider. Document Revised: 03/17/2019 Document Reviewed: 03/17/2019 Elsevier Patient Education  [place] X 5 DAYS  ENSURE OR G2 GATORADE 2 HOURS PRIOR TO ARRIVAL

## 2022-04-19 MED ORDER — CEFAZOLIN IN SODIUM CHLORIDE 3-0.9 GM/100ML-% IV SOLN
3.0000 g | INTRAVENOUS | Status: AC
  Administered 2022-04-20: 3 g via INTRAVENOUS
  Filled 2022-04-19: qty 100

## 2022-04-19 MED ORDER — LACTATED RINGERS IV SOLN: INTRAVENOUS | Status: DC

## 2022-04-19 MED ORDER — FAMOTIDINE 20 MG PO TABS
20.0000 mg | ORAL_TABLET | Freq: Once | ORAL | Status: AC
  Administered 2022-04-20: 20 mg via ORAL

## 2022-04-19 MED ORDER — CHLORHEXIDINE GLUCONATE 0.12 % MT SOLN
15.0000 mL | Freq: Once | OROMUCOSAL | Status: AC
  Administered 2022-04-20: 15 mL via OROMUCOSAL

## 2022-04-19 MED ORDER — ORAL CARE MOUTH RINSE: 15.0000 mL | Freq: Once | OROMUCOSAL | Status: AC

## 2022-04-20 ENCOUNTER — Ambulatory Visit: Payer: Managed Care, Other (non HMO)

## 2022-04-20 ENCOUNTER — Encounter: Payer: Self-pay | Admitting: Orthopedic Surgery

## 2022-04-20 ENCOUNTER — Ambulatory Visit: Payer: Managed Care, Other (non HMO) | Admitting: Certified Registered"

## 2022-04-20 ENCOUNTER — Other Ambulatory Visit: Payer: Self-pay

## 2022-04-20 ENCOUNTER — Ambulatory Visit: Payer: Managed Care, Other (non HMO) | Admitting: Urgent Care

## 2022-04-20 ENCOUNTER — Encounter
Admission: RE | Disposition: A | Discharge status: Home Health | Payer: Self-pay | Source: Home / Self Care | Attending: Orthopedic Surgery

## 2022-04-20 ENCOUNTER — Observation Stay
Admission: RE | Admit: 2022-04-20 | Discharge: 2022-04-21 | Disposition: A | Discharge status: Home Health | Payer: Managed Care, Other (non HMO) | Attending: Orthopedic Surgery | Admitting: Orthopedic Surgery

## 2022-04-20 DIAGNOSIS — M87852 Other osteonecrosis, left femur: Secondary | ICD-10-CM | POA: Diagnosis present

## 2022-04-20 DIAGNOSIS — Z8616 Personal history of COVID-19: Secondary | ICD-10-CM | POA: Insufficient documentation

## 2022-04-20 DIAGNOSIS — Z79899 Other long term (current) drug therapy: Secondary | ICD-10-CM | POA: Diagnosis not present

## 2022-04-20 DIAGNOSIS — M87052 Idiopathic aseptic necrosis of left femur: Secondary | ICD-10-CM | POA: Diagnosis present

## 2022-04-20 DIAGNOSIS — Z01818 Encounter for other preprocedural examination: Secondary | ICD-10-CM

## 2022-04-20 HISTORY — PX: TOTAL HIP ARTHROPLASTY: SHX124

## 2022-04-20 SURGERY — ARTHROPLASTY, HIP, TOTAL, ANTERIOR APPROACH
Anesthesia: Spinal | Site: Hip | Laterality: Left

## 2022-04-20 MED ORDER — KETOROLAC TROMETHAMINE 30 MG/ML IJ SOLN
INTRAMUSCULAR | Status: AC
  Filled 2022-04-20: qty 1

## 2022-04-20 MED ORDER — DEXAMETHASONE SODIUM PHOSPHATE 10 MG/ML IJ SOLN
INTRAMUSCULAR | Status: AC
  Filled 2022-04-20: qty 1

## 2022-04-20 MED ORDER — OXYCODONE HCL 5 MG/5ML PO SOLN: 5.0000 mg | Freq: Once | ORAL | Status: DC | PRN

## 2022-04-20 MED ORDER — ALLOPURINOL 100 MG PO TABS
100.0000 mg | ORAL_TABLET | Freq: Every day | ORAL | Status: DC
  Administered 2022-04-21: 100 mg via ORAL
  Filled 2022-04-20 (×2): qty 1

## 2022-04-20 MED ORDER — ONDANSETRON HCL 4 MG/2ML IJ SOLN
INTRAMUSCULAR | Status: DC | PRN
  Administered 2022-04-20: 4 mg via INTRAVENOUS

## 2022-04-20 MED ORDER — MIDAZOLAM HCL 2 MG/2ML IJ SOLN
INTRAMUSCULAR | Status: AC
  Filled 2022-04-20: qty 2

## 2022-04-20 MED ORDER — SODIUM CHLORIDE 0.9 % IV SOLN: INTRAVENOUS | Status: DC

## 2022-04-20 MED ORDER — BUPIVACAINE HCL (PF) 0.5 % IJ SOLN
INTRAMUSCULAR | Status: DC | PRN
  Administered 2022-04-20: 3 mL via INTRATHECAL

## 2022-04-20 MED ORDER — DOCUSATE SODIUM 100 MG PO CAPS
100.0000 mg | ORAL_CAPSULE | Freq: Two times a day (BID) | ORAL | Status: DC
  Administered 2022-04-20 – 2022-04-21 (×2): 100 mg via ORAL
  Filled 2022-04-20 (×2): qty 1

## 2022-04-20 MED ORDER — ENOXAPARIN SODIUM 40 MG/0.4ML IJ SOSY
40.0000 mg | PREFILLED_SYRINGE | INTRAMUSCULAR | Status: DC
  Administered 2022-04-21: 40 mg via SUBCUTANEOUS
  Filled 2022-04-20: qty 0.4

## 2022-04-20 MED ORDER — 0.9 % SODIUM CHLORIDE (POUR BTL) OPTIME
TOPICAL | Status: DC | PRN
  Administered 2022-04-20: 500 mL

## 2022-04-20 MED ORDER — LIDOCAINE HCL (PF) 2 % IJ SOLN
INTRAMUSCULAR | Status: AC
  Filled 2022-04-20: qty 10

## 2022-04-20 MED ORDER — PANTOPRAZOLE SODIUM 40 MG PO TBEC
40.0000 mg | DELAYED_RELEASE_TABLET | Freq: Every day | ORAL | Status: DC
  Administered 2022-04-20 – 2022-04-21 (×2): 40 mg via ORAL
  Filled 2022-04-20 (×2): qty 1

## 2022-04-20 MED ORDER — PROPOFOL 10 MG/ML IV BOLUS
INTRAVENOUS | Status: AC
  Filled 2022-04-20: qty 20

## 2022-04-20 MED ORDER — FENTANYL CITRATE (PF) 100 MCG/2ML IJ SOLN: 25.0000 ug | INTRAMUSCULAR | Status: DC | PRN

## 2022-04-20 MED ORDER — TRANEXAMIC ACID-NACL 1000-0.7 MG/100ML-% IV SOLN
INTRAVENOUS | Status: AC
  Filled 2022-04-20: qty 200

## 2022-04-20 MED ORDER — FAMOTIDINE 20 MG PO TABS
ORAL_TABLET | ORAL | Status: AC
  Filled 2022-04-20: qty 1

## 2022-04-20 MED ORDER — SURGIFLO WITH THROMBIN (HEMOSTATIC MATRIX KIT) OPTIME
TOPICAL | Status: DC | PRN
  Administered 2022-04-20: 1 via TOPICAL

## 2022-04-20 MED ORDER — DEXAMETHASONE SODIUM PHOSPHATE 10 MG/ML IJ SOLN
INTRAMUSCULAR | Status: DC | PRN
  Administered 2022-04-20: 10 mg via INTRAVENOUS

## 2022-04-20 MED ORDER — MENTHOL 3 MG MT LOZG: 1.0000 | LOZENGE | OROMUCOSAL | Status: DC | PRN

## 2022-04-20 MED ORDER — PHENYLEPHRINE HCL-NACL 20-0.9 MG/250ML-% IV SOLN
INTRAVENOUS | Status: DC | PRN
  Administered 2022-04-20: 20 ug/min via INTRAVENOUS

## 2022-04-20 MED ORDER — METOCLOPRAMIDE HCL 5 MG/ML IJ SOLN: 5.0000 mg | Freq: Three times a day (TID) | INTRAMUSCULAR | Status: DC | PRN

## 2022-04-20 MED ORDER — METOCLOPRAMIDE HCL 5 MG PO TABS: 5.0000 mg | ORAL_TABLET | Freq: Three times a day (TID) | ORAL | Status: DC | PRN

## 2022-04-20 MED ORDER — ONDANSETRON HCL 4 MG PO TABS: 4.0000 mg | ORAL_TABLET | Freq: Four times a day (QID) | ORAL | Status: DC | PRN

## 2022-04-20 MED ORDER — KETOROLAC TROMETHAMINE 30 MG/ML IJ SOLN
INTRAMUSCULAR | Status: DC | PRN
  Administered 2022-04-20: 30 mg via INTRAVENOUS

## 2022-04-20 MED ORDER — PHENOL 1.4 % MT LIQD: 1.0000 | OROMUCOSAL | Status: DC | PRN

## 2022-04-20 MED ORDER — TRANEXAMIC ACID-NACL 1000-0.7 MG/100ML-% IV SOLN
INTRAVENOUS | Status: DC | PRN
  Administered 2022-04-20 (×2): 1000 mg via INTRAVENOUS

## 2022-04-20 MED ORDER — PROPOFOL 10 MG/ML IV BOLUS
INTRAVENOUS | Status: DC | PRN
  Administered 2022-04-20: 30 mg via INTRAVENOUS
  Administered 2022-04-20: 100 ug/kg/min via INTRAVENOUS

## 2022-04-20 MED ORDER — MORPHINE SULFATE (PF) 2 MG/ML IV SOLN: 0.5000 mg | INTRAVENOUS | Status: DC | PRN

## 2022-04-20 MED ORDER — SODIUM CHLORIDE (PF) 0.9 % IJ SOLN
INTRAMUSCULAR | Status: DC | PRN
  Administered 2022-04-20: 50 mL via INTRAMUSCULAR

## 2022-04-20 MED ORDER — ONDANSETRON HCL 4 MG/2ML IJ SOLN
INTRAMUSCULAR | Status: AC
  Filled 2022-04-20: qty 2

## 2022-04-20 MED ORDER — HYDROCODONE-ACETAMINOPHEN 5-325 MG PO TABS
1.0000 | ORAL_TABLET | ORAL | Status: DC | PRN
  Administered 2022-04-20 – 2022-04-21 (×3): 2 via ORAL
  Filled 2022-04-20 (×3): qty 2

## 2022-04-20 MED ORDER — ACETAMINOPHEN 10 MG/ML IV SOLN
INTRAVENOUS | Status: AC
  Filled 2022-04-20: qty 100

## 2022-04-20 MED ORDER — OXYCODONE HCL 5 MG PO TABS: 5.0000 mg | ORAL_TABLET | Freq: Once | ORAL | Status: DC | PRN

## 2022-04-20 MED ORDER — LIDOCAINE HCL (CARDIAC) PF 100 MG/5ML IV SOSY
PREFILLED_SYRINGE | INTRAVENOUS | Status: DC | PRN
  Administered 2022-04-20: 40 mg via INTRAVENOUS

## 2022-04-20 MED ORDER — KETOROLAC TROMETHAMINE 15 MG/ML IJ SOLN
15.0000 mg | Freq: Four times a day (QID) | INTRAMUSCULAR | Status: DC
  Administered 2022-04-20 – 2022-04-21 (×3): 15 mg via INTRAVENOUS
  Filled 2022-04-20 (×3): qty 1

## 2022-04-20 MED ORDER — CEFAZOLIN IN SODIUM CHLORIDE 3-0.9 GM/100ML-% IV SOLN
3.0000 g | Freq: Three times a day (TID) | INTRAVENOUS | Status: AC
  Administered 2022-04-20 – 2022-04-21 (×2): 3 g via INTRAVENOUS
  Filled 2022-04-20 (×2): qty 100

## 2022-04-20 MED ORDER — TRAMADOL HCL 50 MG PO TABS: 50.0000 mg | ORAL_TABLET | Freq: Four times a day (QID) | ORAL | Status: DC | PRN

## 2022-04-20 MED ORDER — CHLORHEXIDINE GLUCONATE 0.12 % MT SOLN
OROMUCOSAL | Status: AC
  Filled 2022-04-20: qty 15

## 2022-04-20 MED ORDER — BSS IO SOLN
15.0000 mL | Freq: Once | INTRAOCULAR | Status: AC
  Administered 2022-04-20: 15 mL
  Filled 2022-04-20: qty 15

## 2022-04-20 MED ORDER — ACETAMINOPHEN 10 MG/ML IV SOLN
INTRAVENOUS | Status: DC | PRN
  Administered 2022-04-20: 1000 mg via INTRAVENOUS

## 2022-04-20 MED ORDER — ACETAMINOPHEN 500 MG PO TABS: 1000.0000 mg | ORAL_TABLET | Freq: Three times a day (TID) | ORAL | Status: DC

## 2022-04-20 MED ORDER — KETOROLAC TROMETHAMINE 0.5 % OP SOLN
1.0000 [drp] | Freq: Four times a day (QID) | OPHTHALMIC | Status: DC
  Administered 2022-04-20: 1 [drp] via OPHTHALMIC
  Filled 2022-04-20: qty 3

## 2022-04-20 MED ORDER — ONDANSETRON HCL 4 MG/2ML IJ SOLN: 4.0000 mg | Freq: Four times a day (QID) | INTRAMUSCULAR | Status: DC | PRN

## 2022-04-20 MED ORDER — THIAMINE MONONITRATE 100 MG PO TABS
100.0000 mg | ORAL_TABLET | Freq: Every day | ORAL | Status: DC
  Administered 2022-04-20 – 2022-04-21 (×2): 100 mg via ORAL
  Filled 2022-04-20 (×2): qty 1

## 2022-04-20 MED ORDER — SURGIPHOR WOUND IRRIGATION SYSTEM - OPTIME: TOPICAL | Status: DC | PRN

## 2022-04-20 MED ORDER — PROPOFOL 1000 MG/100ML IV EMUL
INTRAVENOUS | Status: AC
  Filled 2022-04-20: qty 100

## 2022-04-20 MED ORDER — MIDAZOLAM HCL 5 MG/5ML IJ SOLN
INTRAMUSCULAR | Status: DC | PRN
  Administered 2022-04-20 (×2): 2 mg via INTRAVENOUS

## 2022-04-20 MED ORDER — SODIUM CHLORIDE 0.9 % IR SOLN
Status: DC | PRN
  Administered 2022-04-20: 100 mL

## 2022-04-20 MED ORDER — KETOROLAC TROMETHAMINE 0.5 % OP SOLN
1.0000 [drp] | Freq: Once | OPHTHALMIC | Status: AC
  Administered 2022-04-20: 1 [drp] via OPHTHALMIC
  Filled 2022-04-20: qty 3

## 2022-04-20 MED ORDER — FERROUS SULFATE 325 (65 FE) MG PO TABS
325.0000 mg | ORAL_TABLET | ORAL | Status: DC
  Administered 2022-04-20: 325 mg via ORAL
  Filled 2022-04-20: qty 1

## 2022-04-20 SURGICAL SUPPLY — 72 items
ADH SKN CLS APL DERMABOND .7 (GAUZE/BANDAGES/DRESSINGS) ×1
AGENT HMST KT MTR STRL THRMB (HEMOSTASIS) ×1
APL PRP STRL LF DISP 70% ISPRP (MISCELLANEOUS) ×2
BLADE SAGITTAL AGGR TOOTH XLG (BLADE) ×2 IMPLANT
BNDG CMPR 5X6 CHSV STRCH STRL (GAUZE/BANDAGES/DRESSINGS) ×1
BNDG COHESIVE 6X5 TAN ST LF (GAUZE/BANDAGES/DRESSINGS) ×2 IMPLANT
CHLORAPREP W/TINT 26 (MISCELLANEOUS) ×2 IMPLANT
COVER BACK TABLE REUSABLE LG (DRAPES) ×2 IMPLANT
DERMABOND ADVANCED .7 DNX12 (GAUZE/BANDAGES/DRESSINGS) ×2 IMPLANT
DRAPE 3/4 80X56 (DRAPES) ×2 IMPLANT
DRAPE C-ARM XRAY 36X54 (DRAPES) ×2 IMPLANT
DRAPE POUCH INSTRU U-SHP 10X18 (DRAPES) ×2 IMPLANT
DRSG MEPILEX SACRM 8.7X9.8 (GAUZE/BANDAGES/DRESSINGS) ×2 IMPLANT
DRSG OPSITE POSTOP 4X8 (GAUZE/BANDAGES/DRESSINGS) ×2 IMPLANT
ELECT BLADE 4.0 EZ CLEAN MEGAD (MISCELLANEOUS) ×1
ELECT REM PT RETURN 9FT ADLT (ELECTROSURGICAL) ×1
ELECTRODE BLDE 4.0 EZ CLN MEGD (MISCELLANEOUS) ×2 IMPLANT
ELECTRODE REM PT RTRN 9FT ADLT (ELECTROSURGICAL) ×2 IMPLANT
GLOVE BIO SURGEON STRL SZ8 (GLOVE) ×2 IMPLANT
GLOVE BIOGEL PI IND STRL 8 (GLOVE) ×2 IMPLANT
GLOVE PI ORTHO PRO STRL 7.5 (GLOVE) ×4 IMPLANT
GLOVE PI ORTHO PRO STRL SZ8 (GLOVE) ×4 IMPLANT
GLOVE SURG SYN 7.5  E (GLOVE) ×1
GLOVE SURG SYN 7.5 E (GLOVE) ×1 IMPLANT
GLOVE SURG SYN 7.5 PF PI (GLOVE) ×2 IMPLANT
GOWN SRG XL LVL 3 NONREINFORCE (GOWNS) ×2 IMPLANT
GOWN STRL NON-REIN TWL XL LVL3 (GOWNS) ×1
GOWN STRL REUS W/ TWL LRG LVL3 (GOWN DISPOSABLE) ×2 IMPLANT
GOWN STRL REUS W/ TWL XL LVL3 (GOWN DISPOSABLE) ×2 IMPLANT
GOWN STRL REUS W/TWL LRG LVL3 (GOWN DISPOSABLE) ×1
GOWN STRL REUS W/TWL XL LVL3 (GOWN DISPOSABLE) ×1
HANDLE YANKAUER SUCT OPEN TIP (MISCELLANEOUS) ×2 IMPLANT
HEAD FEM TAPER UNIV 4 (Orthopedic Implant) IMPLANT
HOLDER FOLEY CATH W/STRAP (MISCELLANEOUS) ×2 IMPLANT
HOOD PEEL AWAY T7 (MISCELLANEOUS) ×4 IMPLANT
INSERT 0D POLY 40 SZ F (Insert) IMPLANT
IV NS 100ML SINGLE PACK (IV SOLUTION) ×2 IMPLANT
KIT PATIENT CARE HANA TABLE (KITS) ×2 IMPLANT
LIGHT WAVEGUIDE WIDE FLAT (MISCELLANEOUS) ×2 IMPLANT
MANIFOLD NEPTUNE II (INSTRUMENTS) ×2 IMPLANT
MARKER SKIN DUAL TIP RULER LAB (MISCELLANEOUS) ×2 IMPLANT
MAT ABSORB  FLUID 56X50 GRAY (MISCELLANEOUS) ×1
MAT ABSORB FLUID 56X50 GRAY (MISCELLANEOUS) ×2 IMPLANT
NDL SPNL 20GX3.5 QUINCKE YW (NEEDLE) ×2 IMPLANT
NEEDLE SPNL 20GX3.5 QUINCKE YW (NEEDLE) ×1 IMPLANT
NS IRRIG 500ML POUR BTL (IV SOLUTION) ×2 IMPLANT
PACK HIP COMPR (MISCELLANEOUS) ×2 IMPLANT
SCREW HEX LP 6.5X25 (Screw) IMPLANT
SCREW HEX LP 6.5X35 (Screw) IMPLANT
SHELL TRIDENT II CLUST SZ 56MM (Shell) IMPLANT
SLEEVE SCD COMPRESS KNEE MED (STOCKING) ×2 IMPLANT
SLEEVE UNIV TAPER ADAPTER -2.5 (Stem) IMPLANT
SOLUTION IRRIG SURGIPHOR (IV SOLUTION) ×2 IMPLANT
STEM OFFSET SZ8 41X111 (Stem) IMPLANT
SURGIFLO W/THROMBIN 8M KIT (HEMOSTASIS) IMPLANT
SUT BONE WAX W31G (SUTURE) ×2 IMPLANT
SUT DVC 2 QUILL PDO  T11 36X36 (SUTURE) ×1
SUT DVC 2 QUILL PDO T11 36X36 (SUTURE) ×2 IMPLANT
SUT ETHIBOND 2 V 37 (SUTURE) ×2 IMPLANT
SUT QUILL MONODERM 3-0 PS-2 (SUTURE) ×2 IMPLANT
SUT SILK 0 (SUTURE) ×1
SUT SILK 0 30XBRD TIE 6 (SUTURE) ×2 IMPLANT
SUT VIC AB 0 CT1 36 (SUTURE) ×2 IMPLANT
SUT VIC AB 2-0 CT2 27 (SUTURE) ×4 IMPLANT
SYR 10ML LL (SYRINGE) ×2 IMPLANT
SYR 30ML LL (SYRINGE) ×4 IMPLANT
TAPE MICROFOAM 4IN (TAPE) IMPLANT
TOWEL OR 17X26 4PK STRL BLUE (TOWEL DISPOSABLE) IMPLANT
TRAP FLUID SMOKE EVACUATOR (MISCELLANEOUS) ×2 IMPLANT
TRAY FOLEY SLVR 16FR LF STAT (SET/KITS/TRAYS/PACK) ×2 IMPLANT
WAND WEREWOLF FASTSEAL 6.0 (MISCELLANEOUS) ×2 IMPLANT
WATER STERILE IRR 1000ML POUR (IV SOLUTION) ×2 IMPLANT

## 2022-04-20 NOTE — H&P (Signed)
Chief Complaint  Patient presents with  Pre-op Exam  Scheduled for Left THA 04/20/22 by Dr. Donnajean Lopes Bruce Wang is a 57 y.o. male who presents today for history and physical for left anterior total hip arthroplasty with Dr. Audelia Acton on 04/20/2022. Patient has x-rays from March 07, 2022 showing avascular necrosis with collapse of the femoral head. Patient is having severe debilitating pain in his left lateral hip, groin, thigh and buttocks. He is ambulating with a cane. Patient is taking Celebrex and Tylenol for pain. Pain has been present for greater than 2 years and has been increasing. X-rays show increasing collapse of the femoral head. Patient is seen Dr. Audelia Acton discussed total hip arthroplasty and agreed and consented the procedure.  The patient does not have any history of sickle cell anemia, alcoholism, or other clotting disorders or DVT in the past. The patient is a nondiabetic last A1c is 5.4, non-smoker, BMI 36.4 Previous medical records reviewed.  Past Medical History: Past Medical History:  Diagnosis Date  Allergies  Gout   Past Surgical History: Past Surgical History:  Procedure Laterality Date  GASTRIC BYPASS OPEN 01/09/1997  ARTHROPLASTY HIP TOTAL Right 04/28/2021  Dr. Rosita Kea   Past Family History: Family History  Problem Relation Age of Onset  High blood pressure (Hypertension) Mother  High blood pressure (Hypertension) Sister   Medications: Current Outpatient Medications Ordered in Epic  Medication Sig Dispense Refill  thiamine (VITAMIN B-1) 100 MG tablet Take 100 mg by mouth once daily  allopurinoL (ZYLOPRIM) 300 MG tablet Take by mouth  ascorbic acid, vitamin C, (VITAMIN C) 500 MG tablet Take by mouth  celecoxib (CELEBREX) 200 MG capsule Take 1 capsule (200 mg total) by mouth 2 (two) times daily 30 capsule 0  cetirizine (ZYRTEC) 10 MG tablet Take by mouth once as needed  cholecalciferol (VITAMIN D3) 2,000 unit capsule Take by mouth  cyanocobalamin (VITAMIN  B12) 500 MCG tablet Take by mouth  docusate (COLACE) 100 MG capsule Take 100 mg by mouth 2 (two) times daily  ferrous sulfate 325 (65 FE) MG EC tablet Take 325 mg by mouth daily with breakfast  tadalafiL (CIALIS) 20 MG tablet TAKE 1 TABLET BY MOUTH EVERY OTHER DAY AS NEEDED FOR ERECTILE DYSFUNCTION. *INSUR 6 TABS/25 DAYS*   No current Epic-ordered facility-administered medications on file.   Allergies: No Known Allergies   Review of Systems:  A comprehensive 14 point ROS was performed, reviewed by me today, and the pertinent orthopaedic findings are documented in the HPI.  Exam: BP 124/82  Ht 182.9 cm (6')  Wt (!) 121.3 kg (267 lb 6.4 oz)  BMI 36.27 kg/m  General:  Well developed, well nourished, no apparent distress, normal affect, antalgic gait with a cane HEENT: Head normocephalic, atraumatic, PERRL.   Abdomen: Soft, non tender, non distended, Bowel sounds present.  Heart: Examination of the heart reveals regular, rate, and rhythm. There is no murmur noted on ascultation. There is a normal apical pulse.  Lungs: Lungs are clear to auscultation. There is no wheeze, rhonchi, or crackles. There is normal expansion of bilateral chest walls.   Left hip exam  SKIN: intact SWELLING: none WARMTH: no warmth TENDERNESS: none, Stinchfield Positive ROM: 0 degrees internal rotation and 20 degrees external rotation and pain with internal rotation, localized to the groin; Hip Flexion 75 STRENGTH: normal GAIT: Antalgic with a cane STABILITY: stable to testing CREPITUS: yes LEG LENGTH DISCREPANCY: none NEUROLOGICAL EXAM: normal VASCULAR EXAM: normal LUMBAR SPINE: tenderness: no straight leg raising  sign: no motor exam: normal  The contralateral hip was examined for comparison and it showed: TENDERNESS: none ROM: normal and full STRENGTH: normal STABILITY: stable to testing  X-rays of the left hip reviewed by me today from 03/07/2022 show advanced left hip degenerative changes  with avascular necrosis of the femoral head and collapse of the superior aspect of the femoral head.  Impression: Avascular necrosis of left femur (CMS/HHS-HCC) [M87.052] Avascular necrosis of left femur (CMS-HCC) (primary encounter diagnosis)  Plan:  76. 57 year old male with advanced left hip degenerative changes and avascular necrosis with collapse of the femoral head. He has severe sclerotic changes and subchondral cyst formation throughout the femoral head. Patient has had progressive pain over the last 2 years. His pain interferes with his quality of life and activities daily living. Risks, benefits, complications of a left total hip arthroplasty anterior approach have been discussed with the patient and patient has agreed and consented procedure with Dr. Audelia Acton on 04/20/2022.  The hospitalization and post-operative care and rehabilitation were also discussed. The use of perioperative antibiotics and DVT prophylaxis were discussed. The risk, benefits and alternatives to a surgical intervention were discussed at length with the patient. The patient was also advised of risks related to the medical comorbidities and elevated body mass index (BMI). A lengthy discussion took place to review the most common complications including but not limited to: deep vein thrombosis, pulmonary embolus, heart attack, stroke, infection, wound breakdown, heterotopic ossification, dislocation, numbness, leg length in-equality, intraoperative fracture, damage to nerves, tendon,muscles, arteries or other blood vessels, death and other possible complications from anesthesia. The patient was told that we will take steps to minimize these risks by using sterile technique, antibiotics and DVT prophylaxis when appropriate and follow the patient postoperatively in the office setting to monitor progress. The possibility of recurrent pain, no improvement in pain and actual worsening of pain were also discussed with the patient. The  risk of dislocation following total hip replacement was discussed and potential precautions to prevent dislocation were reviewed.  I had a specific conversation with the patient about his young age and increased risk of component wear and possible need for revision in the future.  This note was generated in part with voice recognition software and I apologize for any typographical errors that were not detected and corrected.

## 2022-04-20 NOTE — Discharge Summary (Signed)
Physician Discharge Summary  Patient ID: Bruce Wang MRN: [id number] DOB/AGE: 1965/06/23 57 y.o.  Admit date: 04/20/2022 Discharge date: 04/21/2022  Admission Diagnoses:  Avascular necrosis of bone of left hip [M87.052]   Discharge Diagnoses: Patient Active Problem List   Diagnosis Date Noted   Avascular necrosis of bone of left hip 04/20/2022   AVN of femur 03/30/2022   Pre-op evaluation 03/29/2022   Vitamin B1 deficiency 10/12/2021   Status post total hip replacement, right 04/28/2021   Alcohol use 09/03/2020   Abnormal CXR    Pneumonia due to COVID-19 virus 08/05/2019   Allergic rhinitis 11/25/2018   Pain of right heel 05/09/2017   Fatty liver 02/03/2017   Iron deficiency anemia 01/26/2017   Rash/skin eruption 08/22/2016   Chronic radicular pain of lower back 07/25/2016   Health maintenance examination 05/30/2016   Vitamin B12 deficiency 05/30/2016   Vitamin D deficiency 05/30/2016   Erectile dysfunction 05/30/2016   Gout 06/01/2015   HNP (herniated nucleus pulposus), lumbar 01/23/2014   Severe obesity (BMI 35.0-39.9) with comorbidity 03/16/2011   Insomnia 03/16/2011   Gastric bypass status for obesity     Past Medical History:  Diagnosis Date   Alcohol use 09/03/2020   Anemia    Complication of anesthesia 2005   WOKE UP DURING INGUINAL HERNIA SURGERY   Fatty liver    Gastric bypass status for obesity 2000   roux en y   Gout    History of chicken pox    Pneumonia due to COVID-19 virus 07/2019   hospitalization     Transfusion: none   Consultants (if any):   Discharged Condition: Improved  Hospital Course: Bruce Wang is an 57 y.o. male who was admitted 04/20/2022 with a diagnosis of Avascular necrosis of bone of left hip and went to the operating room on 04/20/2022 and underwent the above named procedures.    Surgeries: Procedure(s): TOTAL HIP ARTHROPLASTY ANTERIOR APPROACH on 04/20/2022 Patient tolerated the surgery well. Taken to PACU where she was  stabilized and then transferred to the orthopedic floor.  Started on Lovenox 40 mg q 24 hrs. TEDs and SCDs applied bilaterally. Heels elevated on bed. No evidence of DVT. Negative Homan.  Postoperatively, patient noted pain and discomfort in the left eye with blinking.  He had a foreign body sensation.  His eye was watering but no redness or vision changes.  No photophobia.  Tetracaine/fluorescein stain and Woods lamp was used and visualized a small corneal abrasion.  No drainage of fluid.  Patient placed on topical antibiotics and referral to ophthalmology was placed. Physical therapy started on day #1 for gait training and transfer. OT started day #1 for ADL and assisted devices. Patient's IV was d/c on day #1. Patient was able to safely and independently complete all PT goals. PT recommending discharge to home. On post op day #1 patient was stable and ready for discharge to home with HHPT.    Implants: Cup: Trident II Tritanium Clusterhole 21mm/F  w/ x2 screws  Liner: Neutral X3 73mm/F   Stem: Insignia #8 Highoffset  Head:Ceramic Biolox 32mm Head w/ -2.5 sleeve adapter    He was given perioperative antibiotics:  Anti-infectives (From admission, onward)    Start     Dose/Rate Route Frequency Ordered Stop   04/20/22 1800  ceFAZolin (ANCEF) IVPB 3g/100 mL premix        3 g 200 mL/hr over 30 Minutes Intravenous Every 8 hours 04/20/22 1520 04/21/22 0702   04/20/22 0600  ceFAZolin (ANCEF) IVPB 3g/100 mL premix        3 g 200 mL/hr over 30 Minutes Intravenous On call to O.R. 04/19/22 2146 04/20/22 1126     .  He was given sequential compression devices, early ambulation, and Lovenox TEDs for DVT prophylaxis.  He benefited maximally from the hospital stay and there were no complications.    Recent vital signs:  Vitals:   04/21/22 0039 04/21/22 0734  BP: (!) 143/109 (!) 145/90  Pulse: 97 (!) 105  Resp: 18 18  Temp: 97.9 F (36.6 C) 98.9 F (37.2 C)  SpO2: 95% 96%    Recent  laboratory studies:  Lab Results  Component Value Date   HGB 11.2 (L) 04/21/2022   HGB 13.1 03/29/2022   HGB 12.5 (L) 10/05/2021   Lab Results  Component Value Date   WBC 10.2 04/21/2022   PLT 158 04/21/2022   Lab Results  Component Value Date   INR 0.9 08/05/2019   Lab Results  Component Value Date   NA 137 04/21/2022   K 5.2 (H) 04/21/2022   CL 105 04/21/2022   CO2 26 04/21/2022   BUN 15 04/21/2022   CREATININE 0.92 04/21/2022   GLUCOSE 120 (H) 04/21/2022    Discharge Medications:   Allergies as of 04/21/2022   No Known Allergies      Medication List     TAKE these medications    acetaminophen 500 MG tablet Commonly known as: TYLENOL Take 2 tablets (1,000 mg total) by mouth every 8 (eight) hours.   allopurinol 100 MG tablet Commonly known as: ZYLOPRIM Take 1 tablet (100 mg total) by mouth daily.   ascorbic acid 500 MG tablet Commonly known as: VITAMIN C Take 1 tablet (500 mg total) by mouth every other day. With iron   celecoxib 200 MG capsule Commonly known as: CELEBREX Take 200 mg by mouth 2 (two) times daily.   ciprofloxacin 0.3 % ophthalmic solution Commonly known as: CILOXAN Place 1 drop into both eyes every 2 (two) hours. Administer 1 drop, every 2 hours, while awake, for 2 days. Then 1 drop, every 4 hours, while awake, for the next 5 days.   docusate sodium 100 MG capsule Commonly known as: COLACE Take 1 capsule (100 mg total) by mouth 2 (two) times daily.   enoxaparin 40 MG/0.4ML injection Commonly known as: LOVENOX Inject 0.4 mLs (40 mg total) into the skin daily for 14 days.   erythromycin ophthalmic ointment Place 1 Application into the left eye at bedtime.   Iron (Ferrous Sulfate) 325 (65 Fe) MG Tabs Take 325 mg by mouth every other day.   oxyCODONE 5 MG immediate release tablet Commonly known as: Roxicodone Take 0.5-1 tablets (2.5-5 mg total) by mouth every 6 (six) hours as needed for severe pain.   tadalafil 20 MG  tablet Commonly known as: CIALIS Take 20 mg by mouth daily as needed for erectile dysfunction.   thiamine 100 MG tablet Commonly known as: VITAMIN B1 Take 1 tablet (100 mg total) by mouth daily.   traMADol 50 MG tablet Commonly known as: ULTRAM Take 1 tablet (50 mg total) by mouth every 6 (six) hours as needed for moderate pain.   trimethoprim-polymyxin b ophthalmic solution Commonly known as: POLYTRIM Place 1 drop into the left eye every 6 (six) hours for 5 days.   VITAMIN B-12 PO Take 5,000 mcg by mouth 2 (two) times a week.   Vitamin D 50 MCG (2000 UT) Caps Take 1 capsule (  2,000 Units total) by mouth at bedtime.        Diagnostic Studies: DG HIP UNILAT WITH PELVIS 1V LEFT  Result Date: 04/20/2022 CLINICAL DATA:  Status post total left hip arthroplasty. Intraoperative fluoroscopy. EXAM: DG HIP (WITH OR WITHOUT PELVIS) 1V*L* COMPARISON:  Pelvis and right hip radiographs 09/03/2020, right hip radiographs 04/28/2021 FINDINGS: Images were performed intraoperatively without the presence of a radiologist. Interval total left hip arthroplasty. Partial visualization of prior total right hip arthroplasty. No hardware complication is seen. Total fluoroscopy images: 3 Total fluoroscopy time: 23 seconds Total dose: Radiation Exposure Index (as provided by the fluoroscopic device): 3.97 mGy air Kerma Please see intraoperative findings for further detail. IMPRESSION: Intraoperative fluoroscopy provided for total left hip arthroplasty. Electronically Signed   By: Neita Garnet M.D.   On: 04/20/2022 13:27   DG C-Arm 1-60 Min-No Report  Result Date: 04/20/2022 Fluoroscopy was utilized by the requesting physician.  No radiographic interpretation.   DG C-Arm 1-60 Min-No Report  Result Date: 04/20/2022 Fluoroscopy was utilized by the requesting physician.  No radiographic interpretation.   DG Chest 2 View  Result Date: 03/30/2022 CLINICAL DATA:  Preoperative exam EXAM: CHEST - 2 VIEW  COMPARISON:  Chest radiograph 08/11/2019 FINDINGS: Stable cardiac and mediastinal contours. Low lung volumes. Linear heterogeneous opacity left lung base. No pleural effusion or pneumothorax. Thoracic spine degenerative changes. Anterior abdominal wall surgical changes. IMPRESSION: Low lung volumes with left basilar scarring/atelectasis. Electronically Signed   By: Annia Belt M.D.   On: 03/30/2022 06:40    Disposition: Discharge disposition: 06-Home-Health Care Svc          Follow-up Information     Evon Slack, PA-C Follow up in 2 week(s).   Specialties: Orthopedic Surgery, Emergency Medicine Contact information: [address] [place] [address] [phone]         [place] Crane, MD. Call in 1 day(s).   Specialty: Ophthalmology Why: to schedule follow up appointment Contact information: [address] [place] [address] [phone]                  Signed: Amador Cunas [facility] 04/21/2022, 9:21 AM

## 2022-04-20 NOTE — Progress Notes (Signed)
Patient eye is red and irritated, says it feels like "eyelashes are in his eye". Notified Dr. Joelene Millin ordered ketorolac drops and basic salt solution irrigation.   1447-patient has relief from the drops and irrigation, eye has returned to baseline. Will send drops with him to his room in case he needs them again.

## 2022-04-20 NOTE — Anesthesia Preprocedure Evaluation (Signed)
Anesthesia Evaluation  Patient identified by MRN, date of birth, ID band Patient awake    Reviewed: Allergy & Precautions, NPO status , Patient's Chart, lab work & pertinent test results  History of Anesthesia Complications (+) history of anesthetic complications  Airway Mallampati: I  TM Distance: >3 FB Neck ROM: full    Dental no notable dental hx.    Pulmonary neg pulmonary ROS, former smoker   Pulmonary exam normal        Cardiovascular negative cardio ROS Normal cardiovascular exam     Neuro/Psych  Neuromuscular disease  negative psych ROS   GI/Hepatic negative GI ROS, Neg liver ROS,,,  Endo/Other  negative endocrine ROS    Renal/GU      Musculoskeletal   Abdominal   Peds  Hematology  (+) Blood dyscrasia, anemia   Anesthesia Other Findings Past Medical History: 09/03/2020: Alcohol use No date: Anemia 2005: Complication of anesthesia     Comment:  WOKE UP DURING INGUINAL HERNIA SURGERY No date: Fatty liver 2000: Gastric bypass status for obesity     Comment:  roux en y No date: Gout No date: History of chicken pox 07/2019: Pneumonia due to COVID-19 virus     Comment:  hospitalization  Past Surgical History: 07/20/2016: COLONOSCOPY WITH PROPOFOL; N/A     Comment:  1 TA, rpt 5 yrs Servando Snare, Ebony Hail, MD) 07/02/2018: HEMORRHOID SURGERY; N/A     Comment:  Procedure: HEMORRHOIDECTOMY;  Surgeon: Leafy Ro,               MD;  Location: [facility] ORS;  Service: General;  Laterality:               N/A; 1990s: INGUINAL HERNIA REPAIR     Comment:  X2 No date: JOINT REPLACEMENT 07/20/2016: POLYPECTOMY     Comment:  Procedure: POLYPECTOMY;  Surgeon: Midge Minium, MD;                Location: [facility];  Service: Endoscopy;; 2000: ROUX-EN-Y PROCEDURE 04/28/2021: TOTAL HIP ARTHROPLASTY; Right     Comment:  TOTAL HIP ARTHROPLASTY ANTERIOR APPROACH - for AVN;                Surgeon: Kennedy Bucker, MD No  date: VENTRAL HERNIA REPAIR  BMI    Body Mass Index: 36.48 kg/m      Reproductive/Obstetrics negative OB ROS                             Anesthesia Physical Anesthesia Plan  ASA: 2  Anesthesia Plan: Spinal   Post-op Pain Management: Regional block*   Induction:   PONV Risk Score and Plan: 1 and Propofol infusion and TIVA  Airway Management Planned: Natural Airway and Nasal Cannula  Additional Equipment:   Intra-op Plan:   Post-operative Plan:   Informed Consent: I have reviewed the patients History and Physical, chart, labs and discussed the procedure including the risks, benefits and alternatives for the proposed anesthesia with the patient or authorized representative who has indicated his/her understanding and acceptance.     Dental Advisory Given  Plan Discussed with: Anesthesiologist, CRNA and Surgeon  Anesthesia Plan Comments: (Patient reports no bleeding problems and no anticoagulant use.  Plan for spinal with backup GA  Patient consented for risks of anesthesia including but not limited to:  - adverse reactions to medications - damage to eyes, teeth, lips or other oral mucosa - nerve damage due to positioning  -  risk of bleeding, infection and or nerve damage from spinal that could lead to paralysis - risk of headache or failed spinal - damage to teeth, lips or other oral mucosa - sore throat or hoarseness - damage to heart, brain, nerves, lungs, other parts of body or loss of life  Patient voiced understanding.)       Anesthesia Quick Evaluation

## 2022-04-20 NOTE — Op Note (Signed)
Patient Name: Bruce Wang  XMD:[id number]  Pre-Operative Diagnosis: Left hip avascular necrosis  Post-Operative Diagnosis: (same)  Procedure: Left Total Hip Arthroplasty  Components/Implants: Cup: Trident II Tritanium Clusterhole 51mm/F  w/ x2 screws  Liner: Neutral X3 39mm/F   Stem: Insignia #8 Highoffset  Head:Ceramic Biolox 45mm Head w/ -2.5 sleeve adapter  Date of Surgery: 04/20/2022  Surgeon: Reinaldo Berber MD  Assistant: Amador Cunas PA (present and scrubbed throughout the case, critical for assistance with exposure, retraction, instrumentation, and closure)   Anesthesiologist: Joelene Millin  Anesthesia: Spinal   EBL: 200cc  IVF:500cc  Complications: None   Brief history: The patient is a 57 year old male with a history of avascular necrosis with collapse of the left hip with pain limiting their range of motion and activities of daily living, which has failed multiple attempts at conservative therapy.  The risks and benefits of total hip arthroplasty as definitive surgical treatment were discussed with the patient, who opted to proceed with the operation.  After outpatient medical clearance and optimization was completed the patient was admitted to [facility] for the procedure.  All preoperative films were reviewed and an appropriate surgical plan was made prior to surgery.   Description of procedure: The patient was brought to the operating room where laterality was confirmed by all those present to be the left side.  The patient was administered spinal anesthesia on a stretcher prior to being moved supine on the operating room table. Patient was given an intravenous dose of antibiotics for surgical prophylaxis and TXA.  All bony prominences and extremities were well padded and the patient was securely attached to the table boots, a perineal post was placed and the patient had a safety strap placed.  Surgical site was prepped with alcohol and chlorhexidine. The  surgical site over the hip was and draped in typical sterile fashion with multiple layers of adhesive and nonadhesive drapes.  The incision site was marked out with a sterile marker and care was taken to assess the position of the ASIS and ensure appropriate position for the incision.    A surgical timeout was then called with participation of all staff in the room the patient was then a confirmed again and laterality confirmed.  Incision was made over the anterior lateral aspect of the proximal thigh in line with the TFL.  Appropriate retractors were placed and all bleeding vessels were coagulated within the subcutaneous and fatty layers.  An incision was made in the TFL fascia in the interval was carefully identified.  The lateral ascending branches of the circumflex vessels were identified, cauterized and carefully dissected. The main vessels were then tied with a 0 silk hand tie.  Retractors were placed around the superior lateral and inferior medial aspects of the femoral neck and a capsulotomy was performed exposing the hip joint.  Retraction stitches were placed and the capsulotomy to assist with visualization.  Femoral neck cut was then made and the femoral head was extracted after placing the leg in traction.  Bone wax was then applied to the proximal cut surface of the femur and aqua mantis was used to address any bleeding around the femoral neck cut.  Retractors were then placed around the acetabulum to fully visualize the joint space, and the remaining labral tissue was removed and pulvinar was removed.   The acetabulum was then sequentially reamed up to the appropriate size in order to get good fit and fill for the acetabular component while under fluoroscopic guidance.  Acetabular  component was then placed and malleted into a secure fit while confirming position and abduction angle and anteversion utilizing fluoroscopy.  2 screws were then placed in the acetabular cup to assist in securing the cup  in place.  The cup was irrigated and a real neutral liner was placed and impacted into place. It was checked to be securely impacted.  The femur traction was dropped and sequentially externally rotated while performing a release of the posterior and superomedial tissues off of the proximal femur to allow for mobility, care was taken to preserve the external rotators and piriformis attachments.  The remaining interval between the abductors and the capsule was dissected out and a retractor was placed over the superolateral aspect of the femur over the greater trochanter.  The leg was carefully brought down into extension and adducted to provide visualization of the proximal femur for broaching.  The femur was then sequentially broached up to an appropriate size which provided for good fill and stability to the femoral broach.  A trial neck and head were placed on the femoral broach and the leg was brought up for reduction.  The hip was reduced and manual check of stability was performed with the boot detached from the table.  The hip was found to be stable in flexion internal rotation and extension external rotation.  Leg lengths were confirmed on fluoroscopy.   The hip was then dislocated the trial neck and head were removed.  The trial acetabular liner was removed.  The hip was then irrigated with normal saline and the final poly liner was implanted.  The leg was then brought down into extension and adduction in the proximal femur was reexposed.  The broach trial was removed and the femur was irrigated with normal saline prior to the real femoral stem being implanted.  After the femoral stem was seated and shown to have good fit and fill the appropriate head was impacted the leg was brought up and reduced.  There was good range of motion with stability in flexion internal rotation and extension external rotation on testing.  Leg lengths were found to be appropriate on fluoroscopic evaluation at this time.  The hip  was then irrigated with betdine based surgiphor solution and then saline solution.  The capsulotomy was repaired with Ethibond sutures.  A pericapsular and peritrochanteric cocktail with Exparel and bupivacaine was then injected as well as the subcutaneous tissues. Surgiflo was placed around the edges of the cut femoral neck to help reduce any postoperative bleeding. The fascia was closed with a #2 barbed running suture.  The deep tissues were closed with Vicryl sutures the subcutaneous tissues were closed with interrupted Vicryl sutures and a running barbed 3-0 suture.  The skin was then reinforced with Dermabond and a sterile dressing was placed.   The patient was awoken from anesthesia transferred off of the operating room table onto a hospital bed where examination of leg lengths found the leg lengths to be equal with a good distal pulse.  The patient was then transferred to the PACU in stable condition.

## 2022-04-20 NOTE — Transfer of Care (Signed)
Immediate Anesthesia Transfer of Care Note  Patient: Bruce Wang  Procedure(s) Performed: TOTAL HIP ARTHROPLASTY ANTERIOR APPROACH (Left: Hip)  Patient Location: PACU  Anesthesia Type:SPINAL  Level of Consciousness: awake, alert , and oriented  Airway & Oxygen Therapy: Patient Spontanous Breathing  Post-op Assessment: Report given to RN and Post -op Vital signs reviewed and stable  Post vital signs: Reviewed and stable  Last Vitals:  Vitals Value Taken Time  BP 114/76 04/20/22 1337  Temp 35.9 1337  Pulse 91 04/20/22 1340  Resp 15 04/20/22 1340  SpO2 97 % 04/20/22 1340  Vitals shown include unvalidated device data.  Last Pain:  Vitals:   04/20/22 0911  TempSrc: Oral  PainSc: 0-No pain         Complications: No notable events documented.

## 2022-04-20 NOTE — Interval H&P Note (Signed)
Patient history and physical updated. Consent reviewed including risks, benefits, and alternatives to surgery. Patient agrees with above plan to proceed with left anterior total hip.  

## 2022-04-20 NOTE — Evaluation (Signed)
Physical Therapy Evaluation Patient Details Name: Bruce Wang MRN: [id number] DOB: 1965/05/11 Today's Date: 04/20/2022  History of Present Illness  57 y/o male s/p R total hip replacement (anterior approach) 2/2 AVN, had L done ~1 year prior with good results.  Clinical Impression  Pt eager to work with PT, showed great quad strength and effort with supine exercises and was able to ambulate ~75 ft with gradually improving cadence and confidence.  He had R hip replaced ~1 year ago and did well.  Pt met or exceeded expected POD0 expecations.  Continue with POC per total hip protocols.      Recommendations for follow up therapy are one component of a multi-disciplinary discharge planning process, led by the attending physician.  Recommendations may be updated based on patient status, additional functional criteria and insurance authorization.  Follow Up Recommendations       Assistance Recommended at Discharge Intermittent Supervision/Assistance  Patient can return home with the following  Assistance with cooking/housework;Assist for transportation;Help with stairs or ramp for entrance    Equipment Recommendations None recommended by PT  Recommendations for Other Services       Functional Status Assessment Patient has had a recent decline in their functional status and demonstrates the ability to make significant improvements in function in a reasonable and predictable amount of time.     Precautions / Restrictions Precautions Precautions: Fall Restrictions Weight Bearing Restrictions: Yes LLE Weight Bearing: Weight bearing as tolerated      Mobility  Bed Mobility Overal bed mobility: Independent             General bed mobility comments: light self assist with UEs for L LE    Transfers Overall transfer level: Modified independent Equipment used: Rolling walker (2 wheels)               General transfer comment: Pt did need cuing for appropriate UE use as self  selected hands on walker did not result in getting to standing, able to rise multiple times from multiple surfaces with UEs    Ambulation/Gait Ambulation/Gait assistance: Supervision Gait Distance (Feet): 75 Feet Assistive device: Rolling walker (2 wheels)         General Gait Details: Pt initially hesitant and UE reliant, able to increase comfort with weight acceptance and decreased walker reliance with increased distance ultimately achieving consistent walker momentum and confident cadence  Stairs            Wheelchair Mobility    Modified Rankin (Stroke Patients Only)       Balance Overall balance assessment: Modified Independent                                           Pertinent Vitals/Pain Pain Assessment Pain Assessment: 0-10 Pain Score: 2     Home Living Family/patient expects to be discharged to:: Private residence Living Arrangements: Alone Available Help at Discharge: Available PRN/intermittently (significant other)   Home Access: Stairs to enter Entrance Stairs-Rails: Left Entrance Stairs-Number of Steps: 2     Home Equipment: Agricultural consultant (2 wheels);Cane - single point      Prior Function Prior Level of Function : Independent/Modified Independent             Mobility Comments: Pt has been needing SPC due to AVN pain/limp, able to drive/work/etc       Hand Dominance  Extremity/Trunk Assessment   Upper Extremity Assessment Upper Extremity Assessment: Overall WFL for tasks assessed    Lower Extremity Assessment Lower Extremity Assessment: Overall WFL for tasks assessed (expected post-op weakness but able to SLR well)       Communication   Communication: No difficulties  Cognition Arousal/Alertness: Awake/alert Behavior During Therapy: WFL for tasks assessed/performed Overall Cognitive Status: Within Functional Limits for tasks assessed                                           General Comments General comments (skin integrity, edema, etc.): Pt showed good strength and activity tolerance POD0    Exercises Total Joint Exercises Ankle Circles/Pumps: AROM, 10 reps Quad Sets: Strengthening, 10 reps Short Arc Quad: Strengthening, 10 reps Heel Slides: AROM, 5 reps (with resisted leg ext) Hip ABduction/ADduction: Strengthening, 10 reps, AROM Straight Leg Raises: AROM, 10 reps   Assessment/Plan    PT Assessment Patient needs continued PT services  PT Problem List Decreased strength;Decreased range of motion;Decreased activity tolerance;Decreased balance;Decreased mobility;Decreased knowledge of use of DME;Decreased safety awareness;Pain       PT Treatment Interventions DME instruction;Gait training;Stair training;Functional mobility training;Therapeutic activities;Therapeutic exercise;Balance training;Neuromuscular re-education;Patient/family education    PT Goals (Current goals can be found in the Care Plan section)  Acute Rehab PT Goals Patient Stated Goal: go home tomorrow PT Goal Formulation: With patient Time For Goal Achievement: 05/03/22 Potential to Achieve Goals: Good    Frequency BID     Co-evaluation               AM-PAC PT "6 Clicks" Mobility  Outcome Measure Help needed turning from your back to your side while in a flat bed without using bedrails?: None Help needed moving from lying on your back to sitting on the side of a flat bed without using bedrails?: None Help needed moving to and from a bed to a chair (including a wheelchair)?: None Help needed standing up from a chair using your arms (e.g., wheelchair or bedside chair)?: None Help needed to walk in hospital room?: A Little Help needed climbing 3-5 steps with a railing? : A Little 6 Click Score: 22    End of Session Equipment Utilized During Treatment: Gait belt Activity Tolerance: Patient tolerated treatment well Patient left: with chair alarm set;with call bell/phone  within reach;with family/visitor present Nurse Communication: Mobility status PT Visit Diagnosis: Muscle weakness (generalized) (M62.81);Difficulty in walking, not elsewhere classified (R26.2);Pain Pain - Right/Left: Left Pain - part of body: Hip    Time: 3546-5681 PT Time Calculation (min) (ACUTE ONLY): 29 min   Charges:   PT Evaluation $PT Eval Low Complexity: 1 Low PT Treatments $Gait Training: 8-22 mins $Therapeutic Exercise: 8-22 mins        Malachi Pro, DPT 04/20/2022, 5:00 PM

## 2022-04-20 NOTE — Anesthesia Procedure Notes (Signed)
Spinal  Patient location during procedure: OR Start time: 04/20/2022 11:00 AM End time: 04/20/2022 11:08 AM Reason for block: surgical anesthesia Staffing Performed: resident/CRNA  Resident/CRNA: Morene Crocker, CRNA Performed by: Morene Crocker, CRNA Authorized by: Louie Boston, MD   Preanesthetic Checklist Completed: patient identified, IV checked, site marked, risks and benefits discussed, surgical consent, monitors and equipment checked, pre-op evaluation and timeout performed Spinal Block Patient position: sitting Prep: ChloraPrep Patient monitoring: heart rate, continuous pulse ox and blood pressure Approach: midline Location: L3-4 Injection technique: single-shot Needle Needle type: Introducer and Pencan  Needle gauge: 24 G Needle length: 9 cm Assessment Sensory level: T6 Events: CSF return Additional Notes Negative paresthesia. Negative blood return. Positive free-flowing CSF. Expiration date of kit checked and confirmed. Patient tolerated procedure well, without complications. Successful on attempt x1, no complications noted. Pt. Tolerated procedure well without any pain/discomfort

## 2022-04-20 NOTE — Discharge Instructions (Signed)
Instructions after Anterior Total Hip Replacement        Dr. Zachary Aberman, Jr., M.D.      Dept. of Orthopaedics & Sports Medicine  [facility]  [address]  [place],   [address]  Phone: [phone]   Fax: [phone]    DIET: Drink plenty of non-alcoholic fluids. Resume your normal diet. Include foods high in fiber.  ACTIVITY:  You may use crutches or a walker with weight-bearing as tolerated, unless instructed otherwise. You may be weaned off of the walker or crutches by your Physical Therapist.  Continue doing gentle exercises. Exercising will reduce the pain and swelling, increase motion, and prevent muscle weakness.   Please continue to use the TED compression stockings for 2 weeks. You may remove the stockings at night, but should reapply them in the morning. Do not drive or operate any equipment until instructed.  WOUND CARE:  Continue to use ice packs periodically to reduce pain and swelling. You may shower with honeycomb dressing 3 days after your surgery. Do not submerge incision site under water. Remove honeycomb dressing 7 days after surgery and allow dermabond to fall off on its own.   MEDICATIONS: You may resume your regular medications. Please take the pain medication as prescribed on the medication list. Do not take pain medication on an empty stomach. You have been given a prescription for a blood thinner to prevent blood clots. Please take the medication as instructed. (NOTE: After completing a 2 week course of Lovenox, take one Enteric-coated 81 mg aspirin twice a day for 3 additional weeks.) Pain medications and iron supplements can cause constipation. Use a stool softener (Senokot or Colace) on a daily basis and a laxative (dulcolax or miralax) as needed. Do not drive or drink alcoholic beverages when taking pain medications.  POSTOPERATIVE CONSTIPATION PROTOCOL Constipation - defined medically as fewer than three stools per week and  severe constipation as less than one stool per week.  One of the most common issues patients have following surgery is constipation.  Even if you have a regular bowel pattern at home, your normal regimen is likely to be disrupted due to multiple reasons following surgery.  Combination of anesthesia, postoperative narcotics, change in appetite and fluid intake all can affect your bowels.  In order to avoid complications following surgery, here are some recommendations in order to help you during your recovery period.  Colace (docusate) - Pick up an over-the-counter form of Colace or another stool softener and take twice a day as long as you are requiring postoperative pain medications.  Take with a full glass of water daily.  If you experience loose stools or diarrhea, hold the colace until you stool forms back up.  If your symptoms do not get better within 1 week or if they get worse, check with your doctor.  Dulcolax (bisacodyl) - Pick up over-the-counter and take as directed by the product packaging as needed to assist with the movement of your bowels.  Take with a full glass of water.  Use this product as needed if not relieved by Colace only.   MiraLax (polyethylene glycol) - Pick up over-the-counter to have on hand.  MiraLax is a solution that will increase the amount of water in your bowels to assist with bowel movements.  Take as directed and can mix with a glass of water, juice, soda, coffee, or tea.  Take if you go more than two days without a movement. Do not use MiraLax more than   once per day. Call your doctor if you are still constipated or irregular after using this medication for 7 days in a row.  If you continue to have problems with postoperative constipation, please contact the office for further assistance and recommendations.  If you experience "the worst abdominal pain ever" or develop nausea or vomiting, please contact the office immediatly for further recommendations for  treatment.   CALL THE OFFICE FOR: Temperature above 101 degrees Excessive bleeding or drainage on the dressing. Excessive swelling, coldness, or paleness of the toes. Persistent nausea and vomiting.  FOLLOW-UP:  You should have an appointment to return to the office in 2 weeks after surgery. Arrangements have been made for continuation of Physical Therapy (either home therapy or outpatient therapy).  

## 2022-04-21 ENCOUNTER — Encounter: Payer: Self-pay | Admitting: Orthopedic Surgery

## 2022-04-21 DIAGNOSIS — M87852 Other osteonecrosis, left femur: Secondary | ICD-10-CM | POA: Diagnosis not present

## 2022-04-21 LAB — BASIC METABOLIC PANEL
Anion gap: 6 (ref 5–15)
BUN: 15 mg/dL (ref 6–20)
CO2: 26 mmol/L (ref 22–32)
Calcium: 8.4 mg/dL — ABNORMAL LOW (ref 8.9–10.3)
Chloride: 105 mmol/L (ref 98–111)
Creatinine, Ser: 0.92 mg/dL (ref 0.61–1.24)
GFR, Estimated: >60 mL/min (ref >60)
Glucose, Bld: 120 mg/dL — ABNORMAL HIGH (ref 70–99)
Potassium: 5.2 mmol/L — ABNORMAL HIGH (ref 3.5–5.1)
Sodium: 137 mmol/L (ref 135–145)

## 2022-04-21 LAB — CBC
HCT: 35.5 % — ABNORMAL LOW (ref 39.0–52.0)
Hemoglobin: 11.2 g/dL — ABNORMAL LOW (ref 13.0–17.0)
MCH: 27.5 pg (ref 26.0–34.0)
MCHC: 31.5 g/dL (ref 30.0–36.0)
MCV: 87 fL (ref 80.0–100.0)
Platelets: 158 10*3/uL (ref 150–400)
RBC: 4.08 MIL/uL — ABNORMAL LOW (ref 4.22–5.81)
RDW: 14.2 % (ref 11.5–15.5)
WBC: 10.2 10*3/uL (ref 4.0–10.5)
nRBC: 0 % (ref 0.0–0.2)

## 2022-04-21 MED ORDER — ACETAMINOPHEN 500 MG PO TABS: 1000.0000 mg | ORAL_TABLET | Freq: Three times a day (TID) | ORAL | 0 refills | Status: DC

## 2022-04-21 MED ORDER — POLYMYXIN B-TRIMETHOPRIM 10000-0.1 UNIT/ML-% OP SOLN: 1.0000 [drp] | Freq: Four times a day (QID) | OPHTHALMIC | 0 refills | Status: AC

## 2022-04-21 MED ORDER — CIPROFLOXACIN HCL 0.3 % OP SOLN: 1.0000 [drp] | OPHTHALMIC | 0 refills | Status: DC

## 2022-04-21 MED ORDER — ERYTHROMYCIN 5 MG/GM OP OINT: 1.0000 | TOPICAL_OINTMENT | Freq: Every day | OPHTHALMIC | 0 refills | Status: DC

## 2022-04-21 MED ORDER — ENOXAPARIN SODIUM 40 MG/0.4ML IJ SOSY: 40.0000 mg | PREFILLED_SYRINGE | INTRAMUSCULAR | 0 refills | Status: DC

## 2022-04-21 MED ORDER — TRAMADOL HCL 50 MG PO TABS: 50.0000 mg | ORAL_TABLET | Freq: Four times a day (QID) | ORAL | 0 refills | Status: DC | PRN

## 2022-04-21 MED ORDER — OXYCODONE HCL 5 MG PO TABS: 2.5000 mg | ORAL_TABLET | Freq: Four times a day (QID) | ORAL | 0 refills | Status: DC | PRN

## 2022-04-21 MED ORDER — TETRACAINE HCL 0.5 % OP SOLN
2.0000 [drp] | Freq: Once | OPHTHALMIC | Status: DC
  Filled 2022-04-21: qty 4

## 2022-04-21 MED ORDER — FLUORESCEIN SODIUM 1 MG OP STRP
1.0000 | ORAL_STRIP | Freq: Once | OPHTHALMIC | Status: DC
  Filled 2022-04-21: qty 1

## 2022-04-21 MED ORDER — ZOLPIDEM TARTRATE 5 MG PO TABS
5.0000 mg | ORAL_TABLET | Freq: Once | ORAL | Status: AC
  Administered 2022-04-21: 5 mg via ORAL
  Filled 2022-04-21: qty 1

## 2022-04-21 MED ORDER — DOCUSATE SODIUM 100 MG PO CAPS: 100.0000 mg | ORAL_CAPSULE | Freq: Two times a day (BID) | ORAL | 0 refills | Status: DC

## 2022-04-21 NOTE — Progress Notes (Signed)
Physical Therapy Treatment Patient Details Name: Bruce Wang MRN: [id number] DOB: 1965-12-18 Today's Date: 04/21/2022   History of Present Illness 57 y/o male s/p R total hip replacement (anterior approach) 2/2 AVN, had L done ~1 year prior with good results.    PT Comments    Pt was long sitting in bed upon arrival. He is A and O x 4. No difficulty or safety concerns with pt's mobility, transfers, and gait. Was able to safely demonstrate abilities to ascend/descend 4 stair 2 x with and without rails. Pt is cleared form an acute PT standpoint for safe DC home. Will continue to benefit from post acute PT to maximize his safety and independence with all ADLs.    Recommendations for follow up therapy are one component of a multi-disciplinary discharge planning process, led by the attending physician.  Recommendations may be updated based on patient status, additional functional criteria and insurance authorization.     Assistance Recommended at Discharge Intermittent Supervision/Assistance  Patient can return home with the following Assistance with cooking/housework;Assist for transportation;Help with stairs or ramp for entrance   Equipment Recommendations  None recommended by PT       Precautions / Restrictions Precautions Precautions: Fall Restrictions Weight Bearing Restrictions: Yes LLE Weight Bearing: Weight bearing as tolerated     Mobility  Bed Mobility Overal bed mobility: Independent   Transfers Overall transfer level: Modified independent Equipment used: Rolling walker (2 wheels)   Ambulation/Gait Ambulation/Gait assistance: Modified independent (Device/Increase time) Gait Distance (Feet): 300 Feet Assistive device: Rolling walker (2 wheels) Gait Pattern/deviations: Step-through pattern  General Gait Details: no LOB or safety concerns   Stairs Stairs: Yes Stairs assistance: Supervision Stair Management: No rails, Backwards, Forwards, With walker Number of  Stairs: 4 General stair comments: pt demonstrated safe abilities to ascend/descend stairs 2 x. 1 x with use of rail and 1 x with RW only. pt has no difficulty or safety copncerns with stair performance    Balance Overall balance assessment: Modified Independent         Cognition Arousal/Alertness: Awake/alert Behavior During Therapy: WFL for tasks assessed/performed Overall Cognitive Status: Within Functional Limits for tasks assessed    General Comments: Pt is A and O x 4               Pertinent Vitals/Pain Pain Assessment Pain Assessment: No/denies pain Pain Score: 0-No pain     PT Goals (current goals can now be found in the care plan section) Acute Rehab PT Goals Patient Stated Goal: go home ASAP Progress towards PT goals: Progressing toward goals    Frequency    BID      PT Plan Current plan remains appropriate       AM-PAC PT "6 Clicks" Mobility   Outcome Measure  Help needed turning from your back to your side while in a flat bed without using bedrails?: None Help needed moving from lying on your back to sitting on the side of a flat bed without using bedrails?: None Help needed moving to and from a bed to a chair (including a wheelchair)?: None Help needed standing up from a chair using your arms (e.g., wheelchair or bedside chair)?: None Help needed to walk in hospital room?: A Little Help needed climbing 3-5 steps with a railing? : A Little 6 Click Score: 22    End of Session   Activity Tolerance: Patient tolerated treatment well Patient left: in bed;with call bell/phone within reach;with bed alarm set Nurse Communication: Mobility  status PT Visit Diagnosis: Muscle weakness (generalized) (M62.81);Difficulty in walking, not elsewhere classified (R26.2);Pain Pain - Right/Left: Left Pain - part of body: Hip     Time: 0626-9485 PT Time Calculation (min) (ACUTE ONLY): 20 min  Charges:  $Gait Training: 8-22 mins                     Jetta Lout PTA 04/21/22, 10:22 AM

## 2022-04-21 NOTE — Progress Notes (Addendum)
Subjective: 1 Day Post-Op Procedure(s) (LRB): TOTAL HIP ARTHROPLASTY ANTERIOR APPROACH (Left) Patient reports pain as mild.   Patient has no complaints with left hip.  He has successfully completed all physical therapy goals and is doing very well with no discomfort from the left hip.  Patient has complained of foreign body sensation to the left eye, states he has pain with blinking.  Little bit of watering throughout the left eye but no vision changes/pressure/redness/photophobia.  Patient received Toradol eyedrops last night with improvement.  A dressing was applied over the last night and patient has no pain unless he blinks. Denies any CP, SOB, ABD pain. We will continue therapy today.  Plan is to go Home after hospital stay.  Objective: Vital signs in last 24 hours: Temp:  [97.3 F (36.3 C)-98.5 F (36.9 C)] 97.9 F (36.6 C) (04/12 0039) Pulse Rate:  [62-110] 105 (04/12 0734) Resp:  [11-20] 18 (04/12 0734) BP: (108-147)/(85-109) 145/90 (04/12 0734) SpO2:  [93 %-98 %] 96 % (04/12 0734) Weight:  [161 kg] 122 kg (04/11 0911)  Intake/Output from previous day: 04/11 0701 - 04/12 0700 In: 2468.2 [P.O.:356; I.V.:1712.2; IV Piggyback:400] Out: 800 [Urine:600; Blood:200] Intake/Output this shift: No intake/output data recorded.  Recent Labs    04/21/22 0710  HGB 11.2*   Recent Labs    04/21/22 0710  WBC 10.2  RBC 4.08*  HCT 35.5*  PLT 158   No results for input(s): "NA", "K", "CL", "CO2", "BUN", "CREATININE", "GLUCOSE", "CALCIUM" in the last 72 hours. No results for input(s): "LABPT", "INR" in the last 72 hours.  EXAM General - Patient is Alert, Appropriate, and Oriented Ocular exam -left normal range of motion.  Pupils equal round reactive to light.  No visible foreign body.  No conjunctival erythema.  No pain with palpation to the globe.  Patient with positive fluorescein uptake along the lower cornea, a small linear abrasion seen.  No drainage of fluid. Extremity -  Neurovascular intact Sensation intact distally Intact pulses distally Dorsiflexion/Plantar flexion intact Dressing - dressing C/D/I and no drainage Motor Function - intact, moving foot and toes well on exam.   Past Medical History:  Diagnosis Date   Alcohol use 09/03/2020   Anemia    Complication of anesthesia 2005   WOKE UP DURING INGUINAL HERNIA SURGERY   Fatty liver    Gastric bypass status for obesity 2000   roux en y   Gout    History of chicken pox    Pneumonia due to COVID-19 virus 07/2019   hospitalization    Assessment/Plan:   1 Day Post-Op Procedure(s) (LRB): TOTAL HIP ARTHROPLASTY ANTERIOR APPROACH (Left) Principal Problem:   Avascular necrosis of bone of left hip  Estimated body mass index is 36.48 kg/m as calculated from the following:   Height as of this encounter: 6' (1.829 m).   Weight as of this encounter: 122 kg. Advance diet Up with therapy Vital signs stable Labs are stable Patient doing very well in regards to his left hip pain.  He has completed all physical therapy goals.  Care management to assist with discharge to home with home health PT today.  Small corneal abrasion left eye.  Pain well-controlled.  Vision within normal limits.  Patient placed on Polytrim eyedrops during the day and erythromycin ointment at nighttime.  Referral placed to ophthalmology.   DVT Prophylaxis - Lovenox, TED hose, and SCDs Weight-Bearing as tolerated to left leg   T. Cranston Neighbor, PA-C [facility] 04/21/2022, 8:09  AM   Patient seen and examined, agree with above plan.  The patient is doing well status post left anterior total hip, Patient with small corneal abrasion to left eye from surgery, vision normal, eye drops prescribed. no other concerns at this time.  Pain is controlled.  Discussed DVT prophylaxis, pain medication use, and safe transition to home.  All questions answered the patient agrees with above plan will go home after clears PT.    Reinaldo Berber MD

## 2022-04-21 NOTE — TOC Progression Note (Signed)
Transition of Care [facility]) - Progression Note    Patient Details  Name: Bruce Wang MRN: [id number] Date of Birth: 1965-04-02  [facility] CM/SW Contact  Marlowe Sax, RN Phone Number: 04/21/2022, 9:41 AM  Clinical Narrative:     Patient is set up with [facility] prior to surgery by Surgeons office Has a RW from previous surgery  Expected Discharge Plan: Home w Home Health Services Barriers to Discharge: Barriers Resolved  Expected Discharge Plan and Services   Discharge Planning Services: CM Consult   Living arrangements for the past 2 months: Single Family Home Expected Discharge Date: 04/21/22               DME Arranged: N/A DME Agency: NA       HH Arranged: PT HH Agency: [facility] Date [facility] Agency Contacted: 04/21/22 Time HH Agency Contacted: 0940 Representative spoke with at [facility] Agency: Kandee Keen   Social Determinants of Health (SDOH) Interventions SDOH Screenings   Food Insecurity: No Food Insecurity (04/20/2022)  Housing: Low Risk  (04/20/2022)  Transportation Needs: No Transportation Needs (04/20/2022)  Utilities: Not At Risk (04/20/2022)  Depression (PHQ2-9): Medium Risk (03/29/2022)  Tobacco Use: Medium Risk (04/20/2022)    Readmission Risk Interventions     No data to display

## 2022-04-22 NOTE — Anesthesia Postprocedure Evaluation (Signed)
Anesthesia Post Note  Patient: Bruce Wang  Procedure(s) Performed: TOTAL HIP ARTHROPLASTY ANTERIOR APPROACH (Left: Hip)  Patient location during evaluation: Other Anesthesia Type: Spinal Level of consciousness: awake and alert Pain management: pain level controlled Postop Assessment: no headache, no backache, no apparent nausea or vomiting and patient able to bend at knees Anesthetic complications: no Comments: Phone discussion today due to being discharged prior to evaluation by anesthesia team. Pt denies having any issues.    No notable events documented.   Last Vitals:  Vitals:   04/21/22 0734 04/21/22 1010  BP: (!) 145/90 114/77  Pulse: (!) 105 (!) 107  Resp: 18   Temp: 37.2 C   SpO2: 96%     Last Pain:  Vitals:   04/21/22 0734  TempSrc: Oral  PainSc: 1                  Foye Deer

## 2022-04-24 LAB — SURGICAL PATHOLOGY

## 2023-02-25 ENCOUNTER — Other Ambulatory Visit: Payer: Self-pay | Admitting: Family Medicine

## 2023-02-25 DIAGNOSIS — M1A0720 Idiopathic chronic gout, left ankle and foot, without tophus (tophi): Secondary | ICD-10-CM

## 2023-02-27 NOTE — Telephone Encounter (Signed)
 lvm for pt to call office to schedule appt.

## 2023-02-27 NOTE — Telephone Encounter (Signed)
 E-scribed refill.  Plz schedule CPE and fasting lab (no food/drink- except water and/or blk coffee 5 hrs prior) visits for additional refills.

## 2023-03-01 NOTE — Telephone Encounter (Signed)
 Noted

## 2023-03-28 ENCOUNTER — Ambulatory Visit (INDEPENDENT_AMBULATORY_CARE_PROVIDER_SITE_OTHER): Admitting: Family Medicine

## 2023-03-28 VITALS — BP 126/84 | HR 91 | Temp 98.2°F | Ht 70.0 in | Wt 292.5 lb

## 2023-03-28 DIAGNOSIS — Z96643 Presence of artificial hip joint, bilateral: Secondary | ICD-10-CM

## 2023-03-28 DIAGNOSIS — Z9884 Bariatric surgery status: Secondary | ICD-10-CM

## 2023-03-28 DIAGNOSIS — M1A0720 Idiopathic chronic gout, left ankle and foot, without tophus (tophi): Secondary | ICD-10-CM

## 2023-03-28 DIAGNOSIS — E559 Vitamin D deficiency, unspecified: Secondary | ICD-10-CM | POA: Diagnosis not present

## 2023-03-28 DIAGNOSIS — Z0000 Encounter for general adult medical examination without abnormal findings: Secondary | ICD-10-CM

## 2023-03-28 DIAGNOSIS — Z1211 Encounter for screening for malignant neoplasm of colon: Secondary | ICD-10-CM

## 2023-03-28 DIAGNOSIS — N529 Male erectile dysfunction, unspecified: Secondary | ICD-10-CM | POA: Diagnosis not present

## 2023-03-28 DIAGNOSIS — D509 Iron deficiency anemia, unspecified: Secondary | ICD-10-CM

## 2023-03-28 DIAGNOSIS — E538 Deficiency of other specified B group vitamins: Secondary | ICD-10-CM

## 2023-03-28 DIAGNOSIS — Z6841 Body Mass Index (BMI) 40.0 and over, adult: Secondary | ICD-10-CM

## 2023-03-28 DIAGNOSIS — E519 Thiamine deficiency, unspecified: Secondary | ICD-10-CM

## 2023-03-28 LAB — COMPREHENSIVE METABOLIC PANEL
ALT: 19 U/L (ref 0–53)
AST: 19 U/L (ref 0–37)
Albumin: 4.3 g/dL (ref 3.5–5.2)
Alkaline Phosphatase: 75 U/L (ref 39–117)
BUN: 12 mg/dL (ref 6–23)
CO2: 28 meq/L (ref 19–32)
Calcium: 9.4 mg/dL (ref 8.4–10.5)
Chloride: 106 meq/L (ref 96–112)
Creatinine, Ser: 1 mg/dL (ref 0.40–1.50)
GFR: 84.08 mL/min (ref >60.00)
Glucose, Bld: 97 mg/dL (ref 70–99)
Potassium: 4.6 meq/L (ref 3.5–5.1)
Sodium: 141 meq/L (ref 135–145)
Total Bilirubin: 0.5 mg/dL (ref 0.2–1.2)
Total Protein: 6.3 g/dL (ref 6.0–8.3)

## 2023-03-28 LAB — LIPID PANEL
Cholesterol: 163 mg/dL (ref 0–200)
HDL: 73.4 mg/dL (ref >39.00)
LDL Cholesterol: 74 mg/dL (ref 0–99)
NonHDL: 89.76
Total CHOL/HDL Ratio: 2
Triglycerides: 80 mg/dL (ref 0.0–149.0)
VLDL: 16 mg/dL (ref 0.0–40.0)

## 2023-03-28 LAB — FERRITIN: Ferritin: 41.4 ng/mL (ref 22.0–322.0)

## 2023-03-28 LAB — CBC WITH DIFFERENTIAL/PLATELET
Basophils Absolute: 0 10*3/uL (ref 0.0–0.1)
Basophils Relative: 0.6 % (ref 0.0–3.0)
Eosinophils Absolute: 0.2 10*3/uL (ref 0.0–0.7)
Eosinophils Relative: 6.3 % — ABNORMAL HIGH (ref 0.0–5.0)
HCT: 45.2 % (ref 39.0–52.0)
Hemoglobin: 14.7 g/dL (ref 13.0–17.0)
Lymphocytes Relative: 44.3 % (ref 12.0–46.0)
Lymphs Abs: 1.6 10*3/uL (ref 0.7–4.0)
MCHC: 32.5 g/dL (ref 30.0–36.0)
MCV: 94 fl (ref 78.0–100.0)
Monocytes Absolute: 0.3 10*3/uL (ref 0.1–1.0)
Monocytes Relative: 7.6 % (ref 3.0–12.0)
Neutro Abs: 1.5 10*3/uL (ref 1.4–7.7)
Neutrophils Relative %: 41.2 % — ABNORMAL LOW (ref 43.0–77.0)
Platelets: 249 10*3/uL (ref 150.0–400.0)
RBC: 4.81 Mil/uL (ref 4.22–5.81)
RDW: 15.5 % (ref 11.5–15.5)
WBC: 3.7 10*3/uL — ABNORMAL LOW (ref 4.0–10.5)

## 2023-03-28 LAB — IBC PANEL
Iron: 92 ug/dL (ref 42–165)
Saturation Ratios: 23.5 % (ref 20.0–50.0)
TIBC: 392 ug/dL (ref 250.0–450.0)
Transferrin: 280 mg/dL (ref 212.0–360.0)

## 2023-03-28 LAB — HEMOGLOBIN A1C: Hgb A1c MFr Bld: 5.4 % (ref 4.6–6.5)

## 2023-03-28 LAB — FOLATE: Folate: 9.8 ng/mL (ref >5.9)

## 2023-03-28 LAB — TSH: TSH: 1.3 u[IU]/mL (ref 0.35–5.50)

## 2023-03-28 LAB — VITAMIN D 25 HYDROXY (VIT D DEFICIENCY, FRACTURES): VITD: 32.88 ng/mL (ref 30.00–100.00)

## 2023-03-28 LAB — VITAMIN B12: Vitamin B-12: 477 pg/mL (ref 211–911)

## 2023-03-28 MED ORDER — TIRZEPATIDE-WEIGHT MANAGEMENT 5 MG/0.5ML ~~LOC~~ SOLN: 5.0000 mg | SUBCUTANEOUS | 1 refills | Status: DC

## 2023-03-28 MED ORDER — TIRZEPATIDE-WEIGHT MANAGEMENT 2.5 MG/0.5ML ~~LOC~~ SOLN: 2.5000 mg | SUBCUTANEOUS | 0 refills | Status: DC

## 2023-03-28 MED ORDER — TADALAFIL 20 MG PO TABS
20.0000 mg | ORAL_TABLET | Freq: Every day | ORAL | 11 refills | Status: AC | PRN
Start: 2023-03-28 — End: ?

## 2023-03-28 MED ORDER — WEGOVY 0.25 MG/0.5ML ~~LOC~~ SOAJ: 0.2500 mg | SUBCUTANEOUS | 0 refills | Status: DC

## 2023-03-28 MED ORDER — IRON (FERROUS SULFATE) 325 (65 FE) MG PO TABS: 325.0000 mg | ORAL_TABLET | Freq: Every day | ORAL | Status: AC

## 2023-03-28 MED ORDER — WEGOVY 0.5 MG/0.5ML ~~LOC~~ SOAJ: 0.5000 mg | SUBCUTANEOUS | 1 refills | Status: DC

## 2023-03-28 MED ORDER — ALLOPURINOL 100 MG PO TABS
100.0000 mg | ORAL_TABLET | Freq: Every day | ORAL | 4 refills | Status: AC
Start: 2023-03-28 — End: ?

## 2023-03-28 MED ORDER — VITAMIN B-12 5000 MCG PO TBDP: 5000.0000 ug | ORAL_TABLET | ORAL | Status: AC

## 2023-03-28 NOTE — Assessment & Plan Note (Signed)
 Preventative protocols reviewed and updated unless pt declined. Discussed healthy diet and lifestyle.

## 2023-03-28 NOTE — Assessment & Plan Note (Signed)
 Update levels on vit D 2000 units daily

## 2023-03-28 NOTE — Assessment & Plan Note (Signed)
 Update levels on daily iron replacement

## 2023-03-28 NOTE — Patient Instructions (Addendum)
 Labs today  We will refer you back to [facility] for repeat colonoscopy.  Price out [employer] for weight loss - start 0.25mg  weekly for 1 month then increase to 0.5mg  weekly.  Return as needed or in 4-6 weeks after starting medicine for follow up visit  Schedule physical for 1 year.

## 2023-03-28 NOTE — Assessment & Plan Note (Addendum)
 Refill cialis which is effective.

## 2023-03-28 NOTE — Assessment & Plan Note (Addendum)
 Discussed ongoing weight gain noted despite gastric bypass surgery 200s.  Patient is interested in GLP1RA/GIP. Recommend against Zepbound in gastric bypass hx. Start [employer]. Reviewed mechanism of action of medication as well as side effects and adverse events to watch for including nausea, diarrhea, constipation, pancreatitis. No fmhx medullary thyroid cancer or MEN2. Discussed titration schedule for medication. Will start wegovy 0.25mg  weekly x 1 month then increase to 0.5mg  weekly. Discussed need for regular visits for weight management to monitor medication effect and tolerance and weight loss, rec return 4-6 weeks after starting medication.

## 2023-03-28 NOTE — Assessment & Plan Note (Signed)
 Update levels on weekly

## 2023-03-28 NOTE — Assessment & Plan Note (Addendum)
 Update labs after bariatric surgery.  Discussed caution with GLP1/GIP and recommend slow tapering

## 2023-03-28 NOTE — Assessment & Plan Note (Signed)
 Update levels on 100mg  daily

## 2023-03-28 NOTE — Assessment & Plan Note (Signed)
 Update levels on allopurinol 100mg  daily. No recent gout flare.

## 2023-03-28 NOTE — Assessment & Plan Note (Signed)
 Avascular necrosis - R hip 04/2021 Bruce Wang), L hip 04/2022 ([name]

## 2023-03-28 NOTE — Progress Notes (Signed)
 Ph: [phone] Fax: [phone]   Patient ID: Bruce Wang, male    DOB: 1965/07/14, 58 y.o.   MRN: [id number]  This visit was conducted in person.  BP 126/84   Pulse 91   Temp 98.2 F (36.8 C) (Oral)   Ht 5\' 10"  (1.778 m)   Wt 292 lb 8 oz (132.7 kg)   SpO2 97%   BMI 41.97 kg/m   BP Readings from Last 3 Encounters:  03/28/23 126/84  04/21/22 114/77  04/10/22 104/80   CC: CPE Subjective:   HPI: Bruce Wang is a 58 y.o. male presenting on 03/28/2023 for Annual Exam   H/o avascular necrosis of bilat femur - s/p R hip replacement by Dr Rosita Kea 04/2021 followed by L hip replacement by Dr Audelia Acton 04/2022.   S/p gastric bypass surgery 2000 Weight gain noted - 25 lbs over the past year.  States insurance covers injectable weight loss medicines.  No h/o DM or OSA.  Interested in [place].  No h/o MTC or MEN2 in family.   Preventative: COLONOSCOPY WITH PROPOFOL 07/20/2016 - 1 TA, rpt 5 yrs ([facility]).  Prostate cancer screening - fmhx (paternal uncle). Discussed. Will check PSA.  Lung cancer screening - not eligible Flu shot - did not receive. Tdap 2013 - rpt today  Pneumococcal - declines COVID vaccine - did not receive  Shingrix vaccine - discussed, declines.  Seat belt use discussed Sunscreen use discussed. No changing moles on skin. 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     Relevant past medical, surgical, family and social history reviewed and updated as indicated. Interim medical history since our last visit reviewed. Allergies and medications reviewed and updated. Outpatient Medications Prior to Visit  Medication Sig Dispense  Refill   ascorbic acid (VITAMIN C) 500 MG tablet Take 1 tablet (500 mg total) by mouth every other day. With iron     Cholecalciferol (VITAMIN D) 50 MCG (2000 UT) CAPS Take 1 capsule (2,000 Units total) by mouth at bedtime. 30 capsule    thiamine (VITAMIN B1) 100 MG tablet Take 1 tablet (100 mg total) by mouth daily.     allopurinol (ZYLOPRIM) 100 MG tablet TAKE 1 TABLET BY MOUTH EVERY DAY 90 tablet 0   Cyanocobalamin (VITAMIN B-12 PO) Take 5,000 mcg by mouth 2 (two) times a week.     Iron, Ferrous Sulfate, 325 (65 Fe) MG TABS Take 325 mg by mouth every other day.     tadalafil (CIALIS) 20 MG tablet Take 20 mg by mouth daily as needed for erectile dysfunction.     acetaminophen (TYLENOL) 500 MG tablet Take 2 tablets (1,000 mg total) by mouth every 8 (eight) hours. 30 tablet 0   celecoxib (CELEBREX) 200 MG capsule Take 200 mg by mouth 2 (two) times daily.     ciprofloxacin (CILOXAN) 0.3 % ophthalmic solution Place 1 drop into both eyes every 2 (two) hours. Administer 1 drop, every 2 hours, while awake, for 2 days. Then 1 drop, every 4 hours, while awake, for the next 5 days. 5 mL 0   docusate sodium (COLACE) 100 MG capsule Take 1  capsule (100 mg total) by mouth 2 (two) times daily. 10 capsule 0   enoxaparin (LOVENOX) 40 MG/0.4ML injection Inject 0.4 mLs (40 mg total) into the skin daily for 14 days. 5.6 mL 0   erythromycin ophthalmic ointment Place 1 Application into the left eye at bedtime. 3.5 g 0   oxyCODONE (ROXICODONE) 5 MG immediate release tablet Take 0.5-1 tablets (2.5-5 mg total) by mouth every 6 (six) hours as needed for severe pain. 15 tablet 0   traMADol (ULTRAM) 50 MG tablet Take 1 tablet (50 mg total) by mouth every 6 (six) hours as needed for moderate pain. 30 tablet 0   No facility-administered medications prior to visit.     Per HPI unless specifically indicated in ROS section below Review of Systems  Constitutional:  Negative for activity change, appetite change, chills,  fatigue, fever and unexpected weight change.  HENT:  Negative for hearing loss.   Eyes:  Negative for visual disturbance.  Respiratory:  Negative for cough, chest tightness, shortness of breath and wheezing.   Cardiovascular:  Negative for chest pain, palpitations and leg swelling.  Gastrointestinal:  Negative for abdominal distention, abdominal pain, blood in stool, constipation, diarrhea, nausea and vomiting.  Genitourinary:  Negative for difficulty urinating and hematuria.  Musculoskeletal:  Negative for arthralgias, myalgias and neck pain.  Skin:  Negative for rash.  Neurological:  Negative for dizziness, seizures, syncope and headaches.  Hematological:  Negative for adenopathy. Does not bruise/bleed easily.  Psychiatric/Behavioral:  Negative for dysphoric mood. The patient is not nervous/anxious.     Objective:  BP 126/84   Pulse 91   Temp 98.2 F (36.8 C) (Oral)   Ht 5\' 10"  (1.778 m)   Wt 292 lb 8 oz (132.7 kg)   SpO2 97%   BMI 41.97 kg/m   Wt Readings from Last 3 Encounters:  03/28/23 292 lb 8 oz (132.7 kg)  04/20/22 268 lb 15.4 oz (122 kg)  04/10/22 269 lb (122 kg)      Physical Exam Vitals and nursing note reviewed.  Constitutional:      General: He is not in acute distress.    Appearance: Normal appearance. He is well-developed. He is not ill-appearing.  HENT:     Head: Normocephalic and atraumatic.     Right Ear: Hearing, tympanic membrane, ear canal and external ear normal.     Left Ear: Hearing, tympanic membrane, ear canal and external ear normal.     Mouth/Throat:     Mouth: Mucous membranes are moist.     Pharynx: Oropharynx is clear. No oropharyngeal exudate or posterior oropharyngeal erythema.  Eyes:     General: No scleral icterus.    Extraocular Movements: Extraocular movements intact.     Conjunctiva/sclera: Conjunctivae normal.     Pupils: Pupils are equal, round, and reactive to light.  Neck:     Thyroid: No thyroid mass or thyromegaly.   Cardiovascular:     Rate and Rhythm: Normal rate and regular rhythm.     Pulses: Normal pulses.          Radial pulses are 2+ on the right side and 2+ on the left side.     Heart sounds: Normal heart sounds. No murmur heard. Pulmonary:     Effort: Pulmonary effort is normal. No respiratory distress.     Breath sounds: Normal breath sounds. No wheezing, rhonchi or rales.  Abdominal:     General: Bowel sounds are normal. There is no distension.  Palpations: Abdomen is soft. There is no mass.     Tenderness: There is no abdominal tenderness. There is no guarding or rebound.     Hernia: No hernia is present.  Musculoskeletal:        General: Normal range of motion.     Cervical back: Normal range of motion and neck supple.     Right lower leg: No edema.     Left lower leg: No edema.  Lymphadenopathy:     Cervical: No cervical adenopathy.  Skin:    General: Skin is warm and dry.     Findings: No rash.  Neurological:     General: No focal deficit present.     Mental Status: He is alert and oriented to person, place, and time.  Psychiatric:        Mood and Affect: Mood normal.        Behavior: Behavior normal.        Thought Content: Thought content normal.        Judgment: Judgment normal.        Assessment & Plan:   Problem List Items Addressed This Visit     Health maintenance examination - Primary (Chronic)   Preventative protocols reviewed and updated unless pt declined. Discussed healthy diet and lifestyle.       Gastric bypass status for obesity   Update labs after bariatric surgery.  Discussed caution with GLP1/GIP and recommend slow tapering       Relevant Medications   Semaglutide-Weight Management (WEGOVY) 0.25 MG/0.5ML SOAJ   Semaglutide-Weight Management (WEGOVY) 0.5 MG/0.5ML SOAJ (Start on 04/25/2023)   Other Relevant Orders   Lipid panel   VITAMIN D 25 Hydroxy (Vit-D Deficiency, Fractures)   Vitamin B1   Comprehensive metabolic panel   TSH    Hemoglobin A1c   CBC with Differential/Platelet   Vitamin B12   Ferritin   IBC panel   Folate   BMI 40.0-44.9, adult (HCC)   Discussed ongoing weight gain noted despite gastric bypass surgery 200s.  Patient is interested in GLP1RA/GIP. Recommend against Zepbound in gastric bypass hx. Start [employer]. Reviewed mechanism of action of medication as well as side effects and adverse events to watch for including nausea, diarrhea, constipation, pancreatitis. No fmhx medullary thyroid cancer or MEN2. Discussed titration schedule for medication. Will start wegovy 0.25mg  weekly x 1 month then increase to 0.5mg  weekly. Discussed need for regular visits for weight management to monitor medication effect and tolerance and weight loss, rec return 4-6 weeks after starting medication.        Relevant Medications   Semaglutide-Weight Management (WEGOVY) 0.25 MG/0.5ML SOAJ   Semaglutide-Weight Management (WEGOVY) 0.5 MG/0.5ML SOAJ (Start on 04/25/2023)   Gout   Update levels on allopurinol 100mg  daily. No recent gout flare.       Relevant Medications   allopurinol (ZYLOPRIM) 100 MG tablet   Vitamin B12 deficiency   Update levels on weekly      Vitamin D deficiency   Update levels on vit D 2000 units daily      Erectile dysfunction   Refill cialis which is effective.       Relevant Medications   tadalafil (CIALIS) 20 MG tablet   Iron deficiency anemia   Update levels on daily iron replacement      Relevant Medications   Cyanocobalamin (VITAMIN B-12) 5000 MCG TBDP   Iron, Ferrous Sulfate, 325 (65 Fe) MG TABS   Status post bilateral total hip replacement   Avascular  necrosis - R hip 04/2021 Rosita Kea), L hip 04/2022 ([name]      Vitamin B1 deficiency   Update levels on 100mg  daily      Other Visit Diagnoses       Special screening for malignant neoplasms, colon       Relevant Orders   Ambulatory referral to Gastroenterology        Meds ordered this encounter  Medications    allopurinol (ZYLOPRIM) 100 MG tablet    Sig: Take 1 tablet (100 mg total) by mouth daily.    Dispense:  90 tablet    Refill:  4   tadalafil (CIALIS) 20 MG tablet    Sig: Take 1 tablet (20 mg total) by mouth daily as needed for erectile dysfunction.    Dispense:  10 tablet    Refill:  11   Cyanocobalamin (VITAMIN B-12) 5000 MCG TBDP    Sig: Take 5,000 mcg by mouth once a week.   Iron, Ferrous Sulfate, 325 (65 Fe) MG TABS    Sig: Take 325 mg by mouth daily.   DISCONTD: tirzepatide (ZEPBOUND) 2.5 MG/0.5ML injection vial    Sig: Inject 2.5 mg into the skin once a week.    Dispense:  2 mL    Refill:  0   DISCONTD: tirzepatide 5 MG/0.5ML injection vial    Sig: Inject 5 mg into the skin once a week.    Dispense:  2 mL    Refill:  1   Semaglutide-Weight Management (WEGOVY) 0.25 MG/0.5ML SOAJ    Sig: Inject 0.25 mg into the skin once a week.    Dispense:  2 mL    Refill:  0    Use Wegovy not zepbound in gastric bypass history   Semaglutide-Weight Management (WEGOVY) 0.5 MG/0.5ML SOAJ    Sig: Inject 0.5 mg into the skin once a week.    Dispense:  2 mL    Refill:  1    Use Wegovy not zepbound in gastric bypass history    Orders Placed This Encounter  Procedures   Lipid panel   VITAMIN D 25 Hydroxy (Vit-D Deficiency, Fractures)   Vitamin B1   Comprehensive metabolic panel   TSH   Hemoglobin A1c   CBC with Differential/Platelet   Vitamin B12   Ferritin   IBC panel   Folate   Ambulatory referral to Gastroenterology    Referral Priority:   Routine    Referral Type:   Consultation    Referral Reason:   Specialty Services Required    Number of Visits Requested:   1    Patient Instructions  Labs today  We will refer you back to [facility] for repeat colonoscopy.  Price out [employer] for weight loss - start 0.25mg  weekly for 1 month then increase to 0.5mg  weekly.  Return as needed or in 4-6 weeks after starting medicine for follow up visit  Schedule physical for 1 year.   Follow  up plan: Return in about 1 year (around 03/27/2024) for annual exam, prior fasting for blood work.  Eustaquio Boyden, MD

## 2023-04-01 LAB — VITAMIN B1: Vitamin B1 (Thiamine): 39 nmol/L — ABNORMAL HIGH (ref 8–30)

## 2023-04-02 ENCOUNTER — Other Ambulatory Visit: Payer: Self-pay

## 2023-04-02 ENCOUNTER — Other Ambulatory Visit (HOSPITAL_COMMUNITY): Payer: Self-pay

## 2023-04-02 ENCOUNTER — Telehealth: Payer: Self-pay | Admitting: Pharmacy Technician

## 2023-04-02 ENCOUNTER — Telehealth: Payer: Self-pay

## 2023-04-02 ENCOUNTER — Encounter: Payer: Self-pay | Admitting: Family Medicine

## 2023-04-02 DIAGNOSIS — Z860100 Personal history of colon polyps, unspecified: Secondary | ICD-10-CM

## 2023-04-02 MED ORDER — NA SULFATE-K SULFATE-MG SULF 17.5-3.13-1.6 GM/177ML PO SOLN: 1.0000 | Freq: Once | ORAL | 0 refills | Status: AC

## 2023-04-02 NOTE — Telephone Encounter (Signed)
 Pharmacy Patient Advocate Encounter   Received notification from CoverMyMeds that prior authorization for [facility] 0.25MG  is required/requested.   Insurance verification completed.   The patient is insured through [facility] .   Per test claim: [id number]  Submitted and pending

## 2023-04-02 NOTE — Telephone Encounter (Signed)
 Gastroenterology Pre-Procedure Review  Request Date: 06/12/23 Requesting Physician: Dr. Servando Snare  PATIENT REVIEW QUESTIONS: The patient responded to the following health history questions as indicated:    1. Are you having any GI issues? no 2. Do you have a personal history of Polyps? yes (Last colonoscopy performed by Dr. Servando Snare 07/20/16 recommended repeat in 5 years) 3. Do you have a family history of Colon Cancer or Polyps? no 4. Diabetes Mellitus? no 5. Joint replacements in the past 12 months?no 6. Major health problems in the past 3 months?no 7. Any artificial heart valves, MVP, or defibrillator?no    MEDICATIONS & ALLERGIES:    Patient reports the following regarding taking any anticoagulation/antiplatelet therapy:   Plavix, Coumadin, Eliquis, Xarelto, Lovenox, Pradaxa, Brilinta, or Effient? no Aspirin? no  Patient confirms/reports the following medications:  Current Outpatient Medications  Medication Sig Dispense Refill   allopurinol (ZYLOPRIM) 100 MG tablet Take 1 tablet (100 mg total) by mouth daily. 90 tablet 4   ascorbic acid (VITAMIN C) 500 MG tablet Take 1 tablet (500 mg total) by mouth every other day. With iron     Cholecalciferol (VITAMIN D) 50 MCG (2000 UT) CAPS Take 1 capsule (2,000 Units total) by mouth at bedtime. 30 capsule    Cyanocobalamin (VITAMIN B-12) 5000 MCG TBDP Take 5,000 mcg by mouth once a week.     Iron, Ferrous Sulfate, 325 (65 Fe) MG TABS Take 325 mg by mouth daily.     Na Sulfate-K Sulfate-Mg Sulfate concentrate (SUPREP) 17.5-3.13-1.6 GM/177ML SOLN Take 1 kit (354 mLs total) by mouth once for 1 dose. 354 mL 0   Semaglutide-Weight Management (WEGOVY) 0.25 MG/0.5ML SOAJ Inject 0.25 mg into the skin once a week. 2 mL 0   [START ON 04/25/2023] Semaglutide-Weight Management (WEGOVY) 0.5 MG/0.5ML SOAJ Inject 0.5 mg into the skin once a week. 2 mL 1   tadalafil (CIALIS) 20 MG tablet Take 1 tablet (20 mg total) by mouth daily as needed for erectile dysfunction. 10  tablet 11   thiamine (VITAMIN B1) 100 MG tablet Take 1 tablet (100 mg total) by mouth daily.     No current facility-administered medications for this visit.    Patient confirms/reports the following allergies:  No Known Allergies  No orders of the defined types were placed in this encounter.   AUTHORIZATION INFORMATION Primary Insurance: 1D#: Group #:  Secondary Insurance: 1D#: Group #:  SCHEDULE INFORMATION: Date: 05/03/23 Time: Location: [facility]

## 2023-04-03 ENCOUNTER — Other Ambulatory Visit (HOSPITAL_COMMUNITY): Payer: Self-pay

## 2023-04-03 NOTE — Telephone Encounter (Signed)
 Pharmacy Patient Advocate Encounter  Received notification from [facility] that Prior Authorization for [facility] 0.25MG  has been APPROVED from 04/02/23 to 10/29/23. Unable to obtain price due to refill too soon rejection, last fill date 03/28/23 next available fill date04/09/25   PA #/Case ID/Reference #: [id number]

## 2023-04-04 ENCOUNTER — Telehealth: Payer: Self-pay

## 2023-04-04 NOTE — Telephone Encounter (Signed)
 Pharmacy Patient Advocate Encounter  Received notification from [facility] that Prior Authorization for Bruce Wang has been APPROVED from 04/02/2023 to 10/29/2023   PA #/Case ID/Reference #: [id number] please see approval letter in pt's media tab

## 2023-04-05 ENCOUNTER — Encounter: Payer: Self-pay | Admitting: Family Medicine

## 2023-04-05 ENCOUNTER — Other Ambulatory Visit: Payer: Self-pay | Admitting: Family Medicine

## 2023-04-05 MED ORDER — THIAMINE HCL 100 MG PO TABS: 100.0000 mg | ORAL_TABLET | ORAL | Status: AC

## 2023-06-01 ENCOUNTER — Encounter: Payer: Self-pay | Admitting: Family Medicine

## 2023-06-07 NOTE — Telephone Encounter (Signed)
 Copied from CRM [id number]. Topic: Clinical - Medication Question >> Jun 07, 2023  2:11 PM Bruce Wang wrote: Reason for CRM: Patient called to follow up on MyChart message sent 06/01/23 and request to receive a answer back via MyChart or phone call [phone] (M) regarding wegovy  medication

## 2023-06-12 ENCOUNTER — Ambulatory Visit: Admitting: Anesthesiology

## 2023-06-12 ENCOUNTER — Encounter
Admission: RE | Disposition: A | Discharge status: Home / Self Care | Payer: Self-pay | Source: Home / Self Care | Attending: Gastroenterology

## 2023-06-12 ENCOUNTER — Ambulatory Visit
Admission: RE | Admit: 2023-06-12 | Discharge: 2023-06-12 | Disposition: A | Discharge status: Home / Self Care | Attending: Gastroenterology | Admitting: Gastroenterology

## 2023-06-12 ENCOUNTER — Encounter: Payer: Self-pay | Admitting: Gastroenterology

## 2023-06-12 DIAGNOSIS — Z9884 Bariatric surgery status: Secondary | ICD-10-CM | POA: Insufficient documentation

## 2023-06-12 DIAGNOSIS — K641 Second degree hemorrhoids: Secondary | ICD-10-CM | POA: Diagnosis not present

## 2023-06-12 DIAGNOSIS — Z860100 Personal history of colon polyps, unspecified: Secondary | ICD-10-CM

## 2023-06-12 DIAGNOSIS — D123 Benign neoplasm of transverse colon: Secondary | ICD-10-CM | POA: Insufficient documentation

## 2023-06-12 DIAGNOSIS — K635 Polyp of colon: Secondary | ICD-10-CM

## 2023-06-12 DIAGNOSIS — Z87891 Personal history of nicotine dependence: Secondary | ICD-10-CM | POA: Diagnosis not present

## 2023-06-12 DIAGNOSIS — Z6839 Body mass index (BMI) 39.0-39.9, adult: Secondary | ICD-10-CM | POA: Diagnosis not present

## 2023-06-12 DIAGNOSIS — D122 Benign neoplasm of ascending colon: Secondary | ICD-10-CM | POA: Diagnosis not present

## 2023-06-12 DIAGNOSIS — D124 Benign neoplasm of descending colon: Secondary | ICD-10-CM | POA: Diagnosis not present

## 2023-06-12 DIAGNOSIS — Z1211 Encounter for screening for malignant neoplasm of colon: Secondary | ICD-10-CM | POA: Diagnosis not present

## 2023-06-12 DIAGNOSIS — E66813 Obesity, class 3: Secondary | ICD-10-CM | POA: Diagnosis not present

## 2023-06-12 DIAGNOSIS — K5730 Diverticulosis of large intestine without perforation or abscess without bleeding: Secondary | ICD-10-CM | POA: Insufficient documentation

## 2023-06-12 HISTORY — PX: POLYPECTOMY: SHX149

## 2023-06-12 HISTORY — PX: COLONOSCOPY: SHX5424

## 2023-06-12 SURGERY — COLONOSCOPY
Anesthesia: General

## 2023-06-12 MED ORDER — MIDAZOLAM HCL 2 MG/2ML IJ SOLN
INTRAMUSCULAR | Status: DC | PRN
  Administered 2023-06-12: 2 mg via INTRAVENOUS

## 2023-06-12 MED ORDER — PROPOFOL 1000 MG/100ML IV EMUL
INTRAVENOUS | Status: AC
Start: 2023-06-12 — End: ?
  Filled 2023-06-12: qty 400

## 2023-06-12 MED ORDER — PROPOFOL 10 MG/ML IV BOLUS
INTRAVENOUS | Status: DC | PRN
  Administered 2023-06-12: 70 mg via INTRAVENOUS
  Administered 2023-06-12: 10 mg via INTRAVENOUS

## 2023-06-12 MED ORDER — MIDAZOLAM HCL 2 MG/2ML IJ SOLN
INTRAMUSCULAR | Status: AC
  Filled 2023-06-12: qty 2

## 2023-06-12 MED ORDER — LIDOCAINE HCL (CARDIAC) PF 100 MG/5ML IV SOSY
PREFILLED_SYRINGE | INTRAVENOUS | Status: DC | PRN
  Administered 2023-06-12: 100 mg via INTRAVENOUS

## 2023-06-12 MED ORDER — SODIUM CHLORIDE 0.9 % IV SOLN: INTRAVENOUS | Status: DC

## 2023-06-12 MED ORDER — PROPOFOL 500 MG/50ML IV EMUL
INTRAVENOUS | Status: DC | PRN
  Administered 2023-06-12: 165 ug/kg/min via INTRAVENOUS

## 2023-06-12 NOTE — Anesthesia Procedure Notes (Signed)
 Procedure Name: General with mask airway Date/Time: 06/12/2023 7:48 AM  Performed by: Niki Barter, CRNAPre-anesthesia Checklist: Patient identified, Emergency Drugs available, Suction available and Patient being monitored Patient Re-evaluated:Patient Re-evaluated prior to induction Oxygen  Delivery Method: Simple face mask Induction Type: IV induction Placement Confirmation: positive ETCO2 and breath sounds checked- equal and bilateral Dental Injury: Teeth and Oropharynx as per pre-operative assessment

## 2023-06-12 NOTE — Op Note (Signed)
 [facility] Patient Name: Bruce Wang Procedure Date: 06/12/2023 7:17 AM MRN: [id number] Account #: [id number] Date of Birth: 1965-04-11 Admit Type: Outpatient Age: 58 Room: [facility] ENDO ROOM 4 Gender: Male Note Status: Finalized Instrument Name: Charlyn Cooley [id number] Procedure:             Colonoscopy Indications:           High risk colon cancer surveillance: Personal history                         of colonic polyps Providers:             Marnee Sink MD, MD Referring MD:          Claire Crick (Referring MD) Medicines:             Propofol  per Anesthesia Complications:         No immediate complications. Procedure:             Pre-Anesthesia Assessment:                        - Prior to the procedure, a History and Physical was                         performed, and patient medications and allergies were                         reviewed. The patient's tolerance of previous                         anesthesia was also reviewed. The risks and benefits                         of the procedure and the sedation options and risks                         were discussed with the patient. All questions were                         answered, and informed consent was obtained. Prior                         Anticoagulants: The patient has taken no anticoagulant                         or antiplatelet agents. ASA Grade Assessment: II - A                         patient with mild systemic disease. After reviewing                         the risks and benefits, the patient was deemed in                         satisfactory condition to undergo the procedure.                        After obtaining informed consent, the colonoscope was  passed under direct vision. Throughout the procedure,                         the patient's blood pressure, pulse, and oxygen                          saturations were monitored continuously. The                          Colonoscope was introduced through the anus and                         advanced to the the cecum, identified by appendiceal                         orifice and ileocecal valve. The colonoscopy was                         performed without difficulty. The patient tolerated                         the procedure well. The quality of the bowel                         preparation was excellent. Findings:      The perianal and digital rectal examinations were normal.      A 7 mm polyp was found in the descending colon. The polyp was       pedunculated. The polyp was removed with a hot snare. Resection and       retrieval were complete.      A 3 mm polyp was found in the ascending colon. The polyp was sessile.       The polyp was removed with a cold snare. Resection and retrieval were       complete.      A 4 mm polyp was found in the transverse colon. The polyp was sessile.       The polyp was removed with a cold snare. Resection and retrieval were       complete.      Multiple small-mouthed diverticula were found in the sigmoid colon.      Non-bleeding internal hemorrhoids were found during retroflexion. The       hemorrhoids were Grade II (internal hemorrhoids that prolapse but reduce       spontaneously). Impression:            - One 7 mm polyp in the descending colon, removed with                         a hot snare. Resected and retrieved.                        - One 3 mm polyp in the ascending colon, removed with                         a cold snare. Resected and retrieved.                        - One 4 mm polyp in the transverse colon, removed with  a cold snare. Resected and retrieved.                        - Diverticulosis in the sigmoid colon.                        - Non-bleeding internal hemorrhoids. Recommendation:        - Discharge patient to home.                        - Resume previous diet.                        - Continue present  medications.                        - Await pathology results.                        - Repeat colonoscopy in 5 years for surveillance. Procedure Code(s):     --- Professional ---                        [phone], Colonoscopy, flexible; with removal of                         tumor(s), polyp(s), or other lesion(s) by snare                         technique Diagnosis Code(s):     --- Professional ---                        Z86.010, Personal history of colonic polyps                        D12.4, Benign neoplasm of descending colon CPT copyright 2022 American Medical Association. All rights reserved. The codes documented in this report are preliminary and upon coder review may  be revised to meet current compliance requirements. Marnee Sink MD, MD 06/12/2023 8:00:57 AM This report has been signed electronically. Number of Addenda: 0 Note Initiated On: 06/12/2023 7:17 AM Scope Withdrawal Time: 0 hours 7 minutes 50 seconds  Total Procedure Duration: 0 hours 16 minutes 4 seconds  Estimated Blood Loss:  Estimated blood loss: none.      [facility]

## 2023-06-12 NOTE — Anesthesia Postprocedure Evaluation (Signed)
 Anesthesia Post Note  Patient: ELDRIGE PITKIN  Procedure(s) Performed: COLONOSCOPY POLYPECTOMY, INTESTINE  Patient location during evaluation: PACU Anesthesia Type: General Level of consciousness: awake and alert, oriented and patient cooperative Pain management: pain level controlled Vital Signs Assessment: post-procedure vital signs reviewed and stable Respiratory status: spontaneous breathing, nonlabored ventilation and respiratory function stable Cardiovascular status: blood pressure returned to baseline and stable Postop Assessment: adequate PO intake Anesthetic complications: no   No notable events documented.   Last Vitals:  Vitals:   06/12/23 0813 06/12/23 0823  BP: 111/87 (!) 121/92  Pulse: 96 81  Resp: (!) 22 15  Temp:    SpO2: 93% 94%    Last Pain:  Vitals:   06/12/23 0823  TempSrc:   PainSc: 0-No pain                 Dorothey Gate

## 2023-06-12 NOTE — H&P (Signed)
 Marnee Sink, MD [facility] [address] [place], [place] [address] Phone:[phone] Fax : [phone]  Primary Care Physician:  Claire Crick, MD Primary Gastroenterologist:  Dr. Ole Berkeley  Pre-Procedure History & Physical: HPI:  Bruce Wang is a 58 y.o. male is here for an colonoscopy.   Past Medical History:  Diagnosis Date   Alcohol use 09/03/2020   Anemia    Anxiety    Complication of anesthesia 2005   WOKE UP DURING INGUINAL HERNIA SURGERY   Fatty liver    Gastric bypass status for obesity 2000   roux en y   Gout    History of chicken pox    Pneumonia due to COVID-19 virus 07/2019   hospitalization    Past Surgical History:  Procedure Laterality Date   COLONOSCOPY WITH PROPOFOL  N/A 07/20/2016   1 TA, rpt 5 yrs Ole Berkeley, Aashna Matson, MD)   HEMORRHOID SURGERY N/A 07/02/2018   Procedure: HEMORRHOIDECTOMY;  Surgeon: Alben Alma, MD;  Location: [facility];  Service: General;  Laterality: N/A;   INGUINAL HERNIA REPAIR  1990s   X2   JOINT REPLACEMENT     POLYPECTOMY  07/20/2016   Procedure: POLYPECTOMY;  Surgeon: Marnee Sink, MD;  Location: [facility];  Service: Endoscopy;;   ROUX-EN-Y PROCEDURE  2000   TOTAL HIP ARTHROPLASTY Right 04/28/2021   TOTAL HIP ARTHROPLASTY ANTERIOR APPROACH - for AVN;  Surgeon: Molli Angelucci, MD   TOTAL HIP ARTHROPLASTY Left 04/20/2022   Procedure: TOTAL HIP ARTHROPLASTY ANTERIOR APPROACH;  Surgeon: Venus Ginsberg, MD;  Location: [facility];  Service: Orthopedics;  Laterality: Left;   VENTRAL HERNIA REPAIR      Prior to Admission medications   Medication Sig Start Date End Date Taking? Authorizing Provider  allopurinol  (ZYLOPRIM ) 100 MG tablet Take 1 tablet (100 mg total) by mouth daily. 03/28/23   Claire Crick, MD  ascorbic acid  (VITAMIN C ) 500 MG tablet Take 1 tablet (500 mg total) by mouth every other day. With iron  03/30/22   Claire Crick, MD  Cholecalciferol  (VITAMIN D ) 50 MCG (2000 UT) CAPS Take 1 capsule (2,000  Units total) by mouth at bedtime. 03/30/22   Claire Crick, MD  Cyanocobalamin  (VITAMIN B-12) 5000 MCG TBDP Take 5,000 mcg by mouth once a week. 03/28/23   Claire Crick, MD  Iron , Ferrous Sulfate , 325 (65 Fe) MG TABS Take 325 mg by mouth daily. 03/28/23   Claire Crick, MD  Semaglutide -Weight Management (WEGOVY ) 0.25 MG/0.5ML SOAJ Inject 0.25 mg into the skin once a week. 03/28/23   Claire Crick, MD  Semaglutide -Weight Management (WEGOVY ) 0.5 MG/0.5ML SOAJ Inject 0.5 mg into the skin once a week. 04/25/23   Claire Crick, MD  tadalafil  (CIALIS ) 20 MG tablet Take 1 tablet (20 mg total) by mouth daily as needed for erectile dysfunction. 03/28/23   Claire Crick, MD  thiamine  (VITAMIN B1) 100 MG tablet Take 1 tablet (100 mg total) by mouth once a week. 04/05/23   Claire Crick, MD    Allergies as of 04/02/2023   (No Known Allergies)    Family History  Problem Relation Age of Onset   Hypertension Mother    Hypertension Sister    Cancer Paternal Uncle 59       colon cancer, prostate cancer   Dementia Maternal Grandmother    Parkinsonism Maternal Grandfather    Parkinson's disease Maternal Grandfather    Macular degeneration Father    Macular degeneration Paternal Grandmother    ADD / ADHD Son    ADD /  ADHD Son    Coronary artery disease Neg Hx    Stroke Neg Hx    Diabetes Neg Hx     Social History   Socioeconomic History   Marital status: Single    Spouse name: Not on file   Number of children: Not on file   Years of education: Not on file   Highest education level: Some college, no degree  Occupational History   Not on file  Tobacco Use   Smoking status: Former    Current packs/day: 0.00    Average packs/day: 1 pack/day for 8.0 years (8.0 ttl pk-yrs)    Types: Cigarettes    Start date: 01/09/2002    Quit date: 01/09/2010    Years since quitting: 13.4   Smokeless tobacco: Never  Vaping Use   Vaping status: Never Used  Substance and Sexual Activity    Alcohol use: Yes    Alcohol/week: 20.0 standard drinks of alcohol   Drug use: No   Sexual activity: Yes    Birth control/protection: I.U.D., None    Comment: wife  Other Topics Concern   Not on file  Social History Narrative   Caffeine: 1 pot coffee/day   Lives with wife, 2 children, 2 dogs   Occupation: Regulatory affairs officer for [employer]   Edu: 2 yrs college   Activity: walking 2 mi/day   Diet: good water , fruits/vegetables daily, red meat 2x/wk, never fish.     alternates between low carb and eating healthy.     Social Drivers of Corporate investment banker Strain: Low Risk  (03/28/2023)   Overall Financial Resource Strain (CARDIA)    Difficulty of Paying Living Expenses: Not hard at all  Food Insecurity: No Food Insecurity (03/28/2023)   Hunger Vital Sign    Worried About Running Out of Food in the Last Year: Never true    Ran Out of Food in the Last Year: Never true  Transportation Needs: No Transportation Needs (03/28/2023)   PRAPARE - Administrator, Civil Service (Medical): No    Lack of Transportation (Non-Medical): No  Physical Activity: Unknown (03/28/2023)   Exercise Vital Sign    Days of Exercise per Week: 0 days    Minutes of Exercise per Session: Not on file  Stress: Stress Concern Present (03/28/2023)   [employer] of Occupational Health - Occupational Stress Questionnaire    Feeling of Stress : To some extent  Social Connections: Socially Isolated (03/28/2023)   Social Connection and Isolation Panel [NHANES]    Frequency of Communication with Friends and Family: More than three times a week    Frequency of Social Gatherings with Friends and Family: More than three times a week    Attends Religious Services: Never    Database administrator or Organizations: No    Attends Engineer, structural: Not on file    Marital Status: Divorced  Intimate Partner Violence: Not At Risk (04/20/2022)   Humiliation, Afraid, Rape, and Kick questionnaire    Fear  of Current or Ex-Partner: No    Emotionally Abused: No    Physically Abused: No    Sexually Abused: No    Review of Systems: See HPI, otherwise negative ROS  Physical Exam: BP (!) 132/99   Pulse 83   Temp (!) 96.4 F (35.8 C) (Temporal)   Resp 18   Ht 5\' 10"  (1.778 m)   Wt 125.6 kg   SpO2 96%   BMI 39.75 kg/m  General:  Alert,  pleasant and cooperative in NAD Head:  Normocephalic and atraumatic. Neck:  Supple; no masses or thyromegaly. Lungs:  Clear throughout to auscultation.    Heart:  Regular rate and rhythm. Abdomen:  Soft, nontender and nondistended. Normal bowel sounds, without guarding, and without rebound.   Neurologic:  Alert and  oriented x4;  grossly normal neurologically.  Impression/Plan: Bruce Wang is here for an colonoscopy to be performed for a history of adenomatous polyps on 2018   Risks, benefits, limitations, and alternatives regarding  colonoscopy have been reviewed with the patient.  Questions have been answered.  All parties agreeable.   Marnee Sink, MD  06/12/2023, 7:31 AM

## 2023-06-12 NOTE — Anesthesia Preprocedure Evaluation (Addendum)
 Anesthesia Evaluation  Patient identified by MRN, date of birth, ID band Patient awake    Reviewed: Allergy & Precautions, NPO status , Patient's Chart, lab work & pertinent test results  History of Anesthesia Complications Negative for: history of anesthetic complications  Airway Mallampati: III   Neck ROM: Full    Dental  (+) Missing   Pulmonary former smoker (quit 2012)   Pulmonary exam normal breath sounds clear to auscultation       Cardiovascular Normal cardiovascular exam Rhythm:Regular Rate:Normal     Neuro/Psych Alcohol use disorder, few beers per day    GI/Hepatic S/p gastric bypass   Endo/Other    Class 3 obesity  Renal/GU      Musculoskeletal Gout    Abdominal   Peds  Hematology  (+) Blood dyscrasia, anemia   Anesthesia Other Findings Last dose of Wegovy  06/03/23.  Reproductive/Obstetrics                             Anesthesia Physical Anesthesia Plan  ASA: 3  Anesthesia Plan: General   Post-op Pain Management:    Induction: Intravenous  PONV Risk Score and Plan: 2 and Propofol  infusion, TIVA and Treatment may vary due to age or medical condition  Airway Management Planned: Natural Airway  Additional Equipment:   Intra-op Plan:   Post-operative Plan:   Informed Consent: I have reviewed the patients History and Physical, chart, labs and discussed the procedure including the risks, benefits and alternatives for the proposed anesthesia with the patient or authorized representative who has indicated his/her understanding and acceptance.       Plan Discussed with: CRNA  Anesthesia Plan Comments: (LMA/GETA backup discussed.  Patient consented for risks of anesthesia including but not limited to:  - adverse reactions to medications - damage to eyes, teeth, lips or other oral mucosa - nerve damage due to positioning  - sore throat or hoarseness - damage to  heart, brain, nerves, lungs, other parts of body or loss of life  Informed patient about role of CRNA in peri- and intra-operative care.  Patient voiced understanding.)        Anesthesia Quick Evaluation

## 2023-06-12 NOTE — Transfer of Care (Signed)
 Immediate Anesthesia Transfer of Care Note  Patient: Bruce Wang  Procedure(s) Performed: COLONOSCOPY POLYPECTOMY, INTESTINE  Patient Location: Endoscopy Unit  Anesthesia Type:General  Level of Consciousness: drowsy and patient cooperative  Airway & Oxygen  Therapy: Patient Spontanous Breathing and Patient connected to face mask oxygen   Post-op Assessment: Report given to RN and Post -op Vital signs reviewed and stable  Post vital signs: Reviewed and stable  Last Vitals:  Vitals Value Taken Time  BP 117/85 06/12/23 0803  Temp 36.1 C 06/12/23 0802  Pulse 90 06/12/23 0804  Resp 16 06/12/23 0804  SpO2 98 % 06/12/23 0804  Vitals shown include unfiled device data.  Last Pain:  Vitals:   06/12/23 0802  TempSrc: Temporal  PainSc: Asleep         Complications: No notable events documented.

## 2023-06-13 ENCOUNTER — Encounter: Payer: Self-pay | Admitting: Family Medicine

## 2023-06-13 ENCOUNTER — Ambulatory Visit: Payer: Self-pay | Admitting: Gastroenterology

## 2023-06-13 LAB — SURGICAL PATHOLOGY

## 2023-06-19 ENCOUNTER — Encounter: Payer: Self-pay | Admitting: Family Medicine

## 2023-06-19 ENCOUNTER — Ambulatory Visit: Admitting: Family Medicine

## 2023-06-19 MED ORDER — WEGOVY 1 MG/0.5ML ~~LOC~~ SOAJ: 1.0000 mg | SUBCUTANEOUS | 6 refills | Status: DC

## 2023-06-19 NOTE — Patient Instructions (Signed)
 You are doing well today Increase Wegovy  to 1mg  weekly Return in 4-5 months for weight follow up visit

## 2023-06-19 NOTE — Assessment & Plan Note (Addendum)
 Congratulated on 15 lb weight loss over the past 3 months.  Tolerating Wegovy  0.5mg  well. Will increase Wegovy  dose to 1mg  weekly then likely stay at this dose for foreseeable future. Discussed slow titration schedule in gastric bypass history.  Encouraged incorporating resistance training/strength training into routine to maintain muscle mass.  RTC 4 months weight management f/u

## 2023-06-19 NOTE — Progress Notes (Signed)
 Ph: [phone] Fax: [phone]   Patient ID: Bruce Wang, male    DOB: Jul 17, 1965, 58 y.o.   MRN: [id number]  This visit was conducted in person.  BP 120/72   Pulse 99   Temp 98.6 F (37 C) (Oral)   Ht 5\' 10"  (1.778 m)   Wt 278 lb 6 oz (126.3 kg)   SpO2 95%   BMI 39.94 kg/m    CC: weight management f/u Subjective:   HPI: Bruce Wang is a 58 y.o. male presenting on 06/19/2023 for Medical Management of Chronic Issues (Here for wt mgmt f/u.)   Starting weight: 292.5 lbs Last weight: 292.5 lbs Today's weight 278 lbs  S/p gastric bypass surgery 2000.   Currently on Wegovy  0.5mg  weekly - tolerating well without nausea, constipation, diarrhea, epigastric pain, throat pain or swelling.   24 hour recall: 6am breakfast - cup with egg, bacon, sausage, cheese, and banana, 1 cup coffee with splenda and non-dairy creamer  8am - 30gm protein shake  1pm lunch - [place] and cheese roll-up on tortilla with apple 5pm dinner - bacon and tomato sandwich.  Drinking water  throughout day  Activity regimen: Walking at work 10-15k steps per day.       Relevant past medical, surgical, family and social history reviewed and updated as indicated. Interim medical history since our last visit reviewed. Allergies and medications reviewed and updated. Outpatient Medications Prior to Visit  Medication Sig Dispense Refill   allopurinol  (ZYLOPRIM ) 100 MG tablet Take 1 tablet (100 mg total) by mouth daily. 90 tablet 4   ascorbic acid  (VITAMIN C ) 500 MG tablet Take 1 tablet (500 mg total) by mouth every other day. With iron      Cholecalciferol  (VITAMIN D ) 50 MCG (2000 UT) CAPS Take 1 capsule (2,000 Units total) by mouth at bedtime. 30 capsule    Cyanocobalamin  (VITAMIN B-12) 5000 MCG TBDP Take 5,000 mcg by mouth once a week.     Iron , Ferrous Sulfate , 325 (65 Fe) MG TABS Take 325 mg by mouth daily.     tadalafil  (CIALIS ) 20 MG tablet Take 1 tablet (20 mg total) by mouth daily as needed for  erectile dysfunction. 10 tablet 11   thiamine  (VITAMIN B1) 100 MG tablet Take 1 tablet (100 mg total) by mouth once a week.     Semaglutide -Weight Management (WEGOVY ) 0.5 MG/0.5ML SOAJ Inject 0.5 mg into the skin once a week. 2 mL 1   Semaglutide -Weight Management (WEGOVY ) 0.25 MG/0.5ML SOAJ Inject 0.25 mg into the skin once a week. 2 mL 0   No facility-administered medications prior to visit.     Per HPI unless specifically indicated in ROS section below Review of Systems  Objective:  BP 120/72   Pulse 99   Temp 98.6 F (37 C) (Oral)   Ht 5\' 10"  (1.778 m)   Wt 278 lb 6 oz (126.3 kg)   SpO2 95%   BMI 39.94 kg/m   Wt Readings from Last 3 Encounters:  06/19/23 278 lb 6 oz (126.3 kg)  06/12/23 277 lb (125.6 kg)  03/28/23 292 lb 8 oz (132.7 kg)      Physical Exam Vitals and nursing note reviewed.  Constitutional:      Appearance: Normal appearance. He is not ill-appearing.  HENT:     Mouth/Throat:     Mouth: Mucous membranes are moist.     Pharynx: Oropharynx is clear. No oropharyngeal exudate or posterior oropharyngeal erythema.  Eyes:     Extraocular  Movements: Extraocular movements intact.     Pupils: Pupils are equal, round, and reactive to light.  Neck:     Thyroid : No thyroid  mass or thyromegaly.  Cardiovascular:     Rate and Rhythm: Normal rate and regular rhythm.     Pulses: Normal pulses.     Heart sounds: Normal heart sounds. No murmur heard. Pulmonary:     Effort: Pulmonary effort is normal. No respiratory distress.     Breath sounds: Normal breath sounds. No wheezing, rhonchi or rales.  Abdominal:     General: Bowel sounds are normal. There is no distension.     Palpations: Abdomen is soft. There is no mass.     Tenderness: There is no abdominal tenderness. There is no guarding.     Hernia: No hernia is present.  Musculoskeletal:     Cervical back: Normal range of motion and neck supple.     Right lower leg: No edema.     Left lower leg: No edema.   Skin:    General: Skin is warm and dry.     Findings: No rash.  Neurological:     Mental Status: He is alert.  Psychiatric:        Mood and Affect: Mood normal.        Behavior: Behavior normal.        Assessment & Plan:   Problem List Items Addressed This Visit     Severe obesity (BMI 35.0-39.9) with comorbidity (HCC) - Primary   Congratulated on 15 lb weight loss over the past 3 months.  Tolerating Wegovy  0.5mg  well. Will increase Wegovy  dose to 1mg  weekly then likely stay at this dose for foreseeable future. Discussed slow titration schedule in gastric bypass history.  Encouraged incorporating resistance training/strength training into routine to maintain muscle mass.  RTC 4 months weight management f/u      Relevant Medications   Semaglutide -Weight Management (WEGOVY ) 1 MG/0.5ML SOAJ     Meds ordered this encounter  Medications   Semaglutide -Weight Management (WEGOVY ) 1 MG/0.5ML SOAJ    Sig: Inject 1 mg into the skin once a week.    Dispense:  2 mL    Refill:  6    No orders of the defined types were placed in this encounter.   Patient Instructions  You are doing well today Increase Wegovy  to 1mg  weekly Return in 4-5 months for weight follow up visit  Follow up plan: Return in about 4 months (around 10/19/2023) for follow up visit.  Bruce Crick, MD

## 2023-09-29 ENCOUNTER — Encounter: Payer: Self-pay | Admitting: Family Medicine

## 2023-10-01 NOTE — Telephone Encounter (Signed)
 Think this is regarding refill needed of Wegovy . Do you want us  to refill this?

## 2023-10-10 NOTE — Telephone Encounter (Signed)
 Please submit PA for Wegovy   Starting weight: 292.5 lbs Last weight: 292.5 lbs Today's weight 278 lbs (06/2023)  Currently on wegovy  1.0mg  weekly.

## 2023-10-25 ENCOUNTER — Telehealth: Payer: Self-pay

## 2023-10-25 ENCOUNTER — Other Ambulatory Visit (HOSPITAL_COMMUNITY): Payer: Self-pay

## 2023-10-25 NOTE — Telephone Encounter (Signed)
 Pharmacy Patient Advocate Encounter  Received notification from [facility] that Prior Authorization for Wegovy  1mg  has been APPROVED from 10/25/23 to 10/24/24. Unable to obtain price due to refill too soon rejection, last fill date 10/07/23 next available fill date10/19/25   PA #/Case ID/Reference #: [id number]

## 2023-10-25 NOTE — Telephone Encounter (Signed)
 Pharmacy Patient Advocate Encounter   Received notification from Onbase that prior authorization for Wegovy  0.25 is required/requested.   Insurance verification completed.   The patient is insured through [facility].   Per test claim: PA required; PA submitted to above mentioned insurance via Latent Key/confirmation #/EOC [id number] Status is pending

## 2023-10-29 ENCOUNTER — Ambulatory Visit: Admitting: Family Medicine

## 2023-10-29 ENCOUNTER — Ambulatory Visit (INDEPENDENT_AMBULATORY_CARE_PROVIDER_SITE_OTHER)
Admission: RE | Admit: 2023-10-29 | Discharge: 2023-10-29 | Disposition: A | Discharge status: Home / Self Care | Source: Ambulatory Visit | Attending: Family Medicine | Admitting: Family Medicine

## 2023-10-29 DIAGNOSIS — R29898 Other symptoms and signs involving the musculoskeletal system: Secondary | ICD-10-CM | POA: Diagnosis not present

## 2023-10-29 DIAGNOSIS — Z23 Encounter for immunization: Secondary | ICD-10-CM | POA: Diagnosis not present

## 2023-10-29 DIAGNOSIS — Z6837 Body mass index (BMI) 37.0-37.9, adult: Secondary | ICD-10-CM | POA: Diagnosis not present

## 2023-10-29 NOTE — Patient Instructions (Addendum)
 Tdap today  You are doing well today  Continue wegovy  1mg  weekly. Return for physical after 03/27/2024  Good to see you today

## 2023-10-29 NOTE — Assessment & Plan Note (Signed)
 Baseline BMI 41.97 (03/28/2023)

## 2023-10-29 NOTE — Progress Notes (Signed)
 Ph: [phone] Fax: [phone]   Patient ID: Bruce Wang, male    DOB: September 21, 1965, 58 y.o.   MRN: [id number]  This visit was conducted in person.  BP 110/82   Pulse 84   Temp 97.8 F (36.6 C) (Oral)   Ht 5' 10 (1.778 m)   Wt 259 lb 2 oz (117.5 kg)   SpO2 98%   BMI 37.18 kg/m    CC: weight management visit  Subjective:   HPI: Bruce Wang is a 58 y.o. male presenting on 10/29/2023 for Weight Loss (Pt here for Weight Mgmt.)   Starting weight: 292.5 lbs BMI 41.97 (03/28/2023) Last weight: 278 lbs (06/19/2023) Today's weight 259 lbs 2oz  BMI 37.18 10/29/23   S/p gastric bypass surgery 2000.   Wegovy  started ~04/2023, currently on 1mg  weekly dose ~4 months. Tolerating well without nausea, diarrhea, constipation, epigastric pain, throat fullness or thyroid  mass.  He continues getting sufficient protein and strength training. No muscle weakness noted.   24 hour recall: 8am breakfast - 2 scrambled eggs with bacon, water  and coffee  11:30am lunch - [place] swiss rollup with lettuce and tomato, with zero carb tortilla, water  6pm dinner - steak, water    Activity regimen: Walking at work 10-15k steps.  Regularly active at gym - has routine with treadmill and circuit training.      Relevant past medical, surgical, family and social history reviewed and updated as indicated. Interim medical history since our last visit reviewed. Allergies and medications reviewed and updated. Outpatient Medications Prior to Visit  Medication Sig Dispense Refill   allopurinol  (ZYLOPRIM ) 100 MG tablet Take 1 tablet (100 mg total) by mouth daily. 90 tablet 4   ascorbic acid  (VITAMIN C ) 500 MG tablet Take 1 tablet (500 mg total) by mouth every other day. With iron      Cholecalciferol  (VITAMIN D ) 50 MCG (2000 UT) CAPS Take 1 capsule (2,000 Units total) by mouth at bedtime. 30 capsule    Cyanocobalamin  (VITAMIN B-12) 5000 MCG TBDP Take 5,000 mcg by mouth once a week.     Iron , Ferrous Sulfate ,  325 (65 Fe) MG TABS Take 325 mg by mouth daily.     Semaglutide -Weight Management (WEGOVY ) 1 MG/0.5ML SOAJ Inject 1 mg into the skin once a week. 2 mL 6   tadalafil  (CIALIS ) 20 MG tablet Take 1 tablet (20 mg total) by mouth daily as needed for erectile dysfunction. 10 tablet 11   thiamine  (VITAMIN B1) 100 MG tablet Take 1 tablet (100 mg total) by mouth once a week.     No facility-administered medications prior to visit.     Per HPI unless specifically indicated in ROS section below Review of Systems  Objective:  BP 110/82   Pulse 84   Temp 97.8 F (36.6 C) (Oral)   Ht 5' 10 (1.778 m)   Wt 259 lb 2 oz (117.5 kg)   SpO2 98%   BMI 37.18 kg/m   Wt Readings from Last 3 Encounters:  10/29/23 259 lb 2 oz (117.5 kg)  06/19/23 278 lb 6 oz (126.3 kg)  06/12/23 277 lb (125.6 kg)      Physical Exam Vitals and nursing note reviewed.  Constitutional:      Appearance: Normal appearance. He is not ill-appearing.  HENT:     Head: Normocephalic and atraumatic.     Mouth/Throat:     Mouth: Mucous membranes are moist.     Pharynx: Oropharynx is clear. No oropharyngeal exudate or posterior oropharyngeal  erythema.  Eyes:     Extraocular Movements: Extraocular movements intact.     Pupils: Pupils are equal, round, and reactive to light.  Cardiovascular:     Rate and Rhythm: Normal rate and regular rhythm.     Pulses: Normal pulses.     Heart sounds: Normal heart sounds. No murmur heard. Pulmonary:     Effort: Pulmonary effort is normal. No respiratory distress.     Breath sounds: Normal breath sounds. No wheezing, rhonchi or rales.  Abdominal:     General: Bowel sounds are normal. There is no distension.     Palpations: Abdomen is soft. There is mass (RUQ).     Tenderness: There is no abdominal tenderness. There is no guarding or rebound. Negative signs include Murphy's sign.     Hernia: No hernia is present.   Musculoskeletal:     Right lower leg: No edema.     Left lower leg: No  edema.  Skin:    General: Skin is warm and dry.     Findings: No rash.  Neurological:     Mental Status: He is alert.  Psychiatric:        Mood and Affect: Mood normal.        Behavior: Behavior normal.        Assessment & Plan:   Problem List Items Addressed This Visit     Body mass index (BMI) 37.0-37.9, adult   Current BMI 37.18 Congratulated on ongoing weight loss to date, total of 33 lbs since staring Wegovy  ~04/2023.  Tolerating Wegovy  well at 1mg  weekly - will continue at current dose.  Again encouraged good protein intake and regular strength training.  Reassess at CPE 03/2023.       Finding of floating rib   Not appreciated on CXR 03/2022, although R lower ribs obscured by abdomen/liver.  Pt overall asxs from this. Denies h/o floating ribs or inciting trauma.  Update R sided rib series.      Relevant Orders   DG Ribs Unilateral Right   Morbid obesity baseline BMI 41.97 (03/28/2023) - Primary   Baseline BMI 41.97 (03/28/2023)        No orders of the defined types were placed in this encounter.   Orders Placed This Encounter  Procedures   DG Ribs Unilateral Right    Standing Status:   Future    Number of Occurrences:   1    Expiration Date:   10/28/2024    Reason for Exam (SYMPTOM  OR DIAGNOSIS REQUIRED):   right separate lower rib, nontender    Preferred imaging location?:   [facility]   Tdap vaccine greater than or equal to 7yo IM    Patient Instructions  Tdap today  You are doing well today  Continue wegovy  1mg  weekly. Return for physical after 03/27/2024  Good to see you today   Follow up plan: Return in about 5 months (around 03/28/2024) for annual exam, prior fasting for blood work.  Anton Blas, MD

## 2023-10-29 NOTE — Assessment & Plan Note (Signed)
 Not appreciated on CXR 03/2022, although R lower ribs obscured by abdomen/liver.  Pt overall asxs from this. Denies h/o floating ribs or inciting trauma.  Update R sided rib series.

## 2023-10-29 NOTE — Assessment & Plan Note (Addendum)
 Current BMI 37.18 Congratulated on ongoing weight loss to date, total of 33 lbs since staring Wegovy  ~04/2023.  Tolerating Wegovy  well at 1mg  weekly - will continue at current dose.  Again encouraged good protein intake and regular strength training.  Reassess at CPE 03/2023.

## 2023-11-01 ENCOUNTER — Ambulatory Visit: Payer: Self-pay | Admitting: Family Medicine

## 2023-11-29 ENCOUNTER — Encounter: Payer: Self-pay | Admitting: Family Medicine

## 2023-12-03 MED ORDER — WEGOVY 1.7 MG/0.75ML ~~LOC~~ SOAJ: 1.7000 mg | SUBCUTANEOUS | 1 refills | Status: DC

## 2024-01-14 ENCOUNTER — Encounter: Payer: Self-pay | Admitting: Family Medicine

## 2024-01-16 MED ORDER — WEGOVY 1.7 MG/0.75ML ~~LOC~~ SOAJ: 1.7000 mg | SUBCUTANEOUS | 2 refills | Status: AC

## 2024-03-31 ENCOUNTER — Encounter: Admitting: Family Medicine
